# Patient Record
Sex: Female | Born: 1945
Health system: Southern US, Community
[De-identification: ages and names within clinical notes are randomized; demographics above are authoritative.]

## PROBLEM LIST (undated history)

## (undated) DIAGNOSIS — J439 Emphysema, unspecified: Secondary | ICD-10-CM

## (undated) DIAGNOSIS — K589 Irritable bowel syndrome without diarrhea: Secondary | ICD-10-CM

## (undated) DIAGNOSIS — T7840XA Allergy, unspecified, initial encounter: Secondary | ICD-10-CM

## (undated) DIAGNOSIS — C801 Malignant (primary) neoplasm, unspecified: Secondary | ICD-10-CM

## (undated) DIAGNOSIS — F419 Anxiety disorder, unspecified: Secondary | ICD-10-CM

## (undated) DIAGNOSIS — C3490 Malignant neoplasm of unspecified part of unspecified bronchus or lung: Secondary | ICD-10-CM

## (undated) DIAGNOSIS — I10 Essential (primary) hypertension: Secondary | ICD-10-CM

## (undated) DIAGNOSIS — B009 Herpesviral infection, unspecified: Secondary | ICD-10-CM

## (undated) DIAGNOSIS — J449 Chronic obstructive pulmonary disease, unspecified: Secondary | ICD-10-CM

## (undated) DIAGNOSIS — M75100 Unspecified rotator cuff tear or rupture of unspecified shoulder, not specified as traumatic: Secondary | ICD-10-CM

## (undated) DIAGNOSIS — F1910 Other psychoactive substance abuse, uncomplicated: Secondary | ICD-10-CM

## (undated) DIAGNOSIS — R0982 Postnasal drip: Secondary | ICD-10-CM

## (undated) DIAGNOSIS — I4510 Unspecified right bundle-branch block: Secondary | ICD-10-CM

## (undated) DIAGNOSIS — E785 Hyperlipidemia, unspecified: Secondary | ICD-10-CM

## (undated) DIAGNOSIS — Z8489 Family history of other specified conditions: Secondary | ICD-10-CM

## (undated) DIAGNOSIS — M797 Fibromyalgia: Secondary | ICD-10-CM

## (undated) DIAGNOSIS — F1010 Alcohol abuse, uncomplicated: Secondary | ICD-10-CM

## (undated) DIAGNOSIS — K219 Gastro-esophageal reflux disease without esophagitis: Secondary | ICD-10-CM

## (undated) HISTORY — DX: Unspecified rotator cuff tear or rupture of unspecified shoulder, not specified as traumatic: M75.100

## (undated) HISTORY — DX: Anxiety disorder, unspecified: F41.9

## (undated) HISTORY — DX: Irritable bowel syndrome, unspecified: K58.9

## (undated) HISTORY — PX: COLONOSCOPY: SHX174

## (undated) HISTORY — DX: Emphysema, unspecified: J43.9

## (undated) HISTORY — DX: Unspecified right bundle-branch block: I45.10

## (undated) HISTORY — DX: Postnasal drip: R09.82

## (undated) HISTORY — DX: Fibromyalgia: M79.7

## (undated) HISTORY — DX: Herpesviral infection, unspecified: B00.9

## (undated) HISTORY — DX: Allergy, unspecified, initial encounter: T78.40XA

## (undated) HISTORY — DX: Malignant neoplasm of unspecified part of unspecified bronchus or lung: C34.90

## (undated) HISTORY — DX: Alcohol abuse, uncomplicated: F10.10

## (undated) HISTORY — DX: Other psychoactive substance abuse, uncomplicated: F19.10

## (undated) HISTORY — DX: Gastro-esophageal reflux disease without esophagitis: K21.9

## (undated) HISTORY — DX: Hyperlipidemia, unspecified: E78.5

---

## 1998-08-17 ENCOUNTER — Other Ambulatory Visit: Admission: RE | Admit: 1998-08-17 | Discharge: 1998-08-17 | Payer: Self-pay | Admitting: Obstetrics & Gynecology

## 1999-08-30 ENCOUNTER — Encounter: Admission: RE | Admit: 1999-08-30 | Discharge: 1999-08-30 | Payer: Self-pay | Admitting: Orthopedic Surgery

## 1999-08-30 ENCOUNTER — Encounter: Payer: Self-pay | Admitting: Orthopedic Surgery

## 1999-10-03 ENCOUNTER — Other Ambulatory Visit: Admission: RE | Admit: 1999-10-03 | Discharge: 1999-10-03 | Payer: Self-pay | Admitting: Obstetrics & Gynecology

## 2000-02-12 HISTORY — PX: MOUTH SURGERY: SHX715

## 2000-10-08 ENCOUNTER — Other Ambulatory Visit: Admission: RE | Admit: 2000-10-08 | Discharge: 2000-10-08 | Payer: Self-pay | Admitting: Obstetrics & Gynecology

## 2000-11-27 ENCOUNTER — Encounter: Payer: Self-pay | Admitting: Obstetrics & Gynecology

## 2000-11-27 ENCOUNTER — Encounter: Admission: RE | Admit: 2000-11-27 | Discharge: 2000-11-27 | Payer: Self-pay | Admitting: Obstetrics & Gynecology

## 2001-11-20 ENCOUNTER — Other Ambulatory Visit: Admission: RE | Admit: 2001-11-20 | Discharge: 2001-11-20 | Payer: Self-pay | Admitting: Obstetrics & Gynecology

## 2002-12-31 ENCOUNTER — Other Ambulatory Visit: Admission: RE | Admit: 2002-12-31 | Discharge: 2002-12-31 | Payer: Self-pay | Admitting: Obstetrics & Gynecology

## 2004-01-09 ENCOUNTER — Ambulatory Visit: Payer: Self-pay | Admitting: Internal Medicine

## 2004-01-30 ENCOUNTER — Ambulatory Visit: Payer: Self-pay | Admitting: Internal Medicine

## 2004-02-10 ENCOUNTER — Encounter: Admission: RE | Admit: 2004-02-10 | Discharge: 2004-02-10 | Payer: Self-pay | Admitting: Obstetrics & Gynecology

## 2005-01-16 ENCOUNTER — Encounter: Admission: RE | Admit: 2005-01-16 | Discharge: 2005-01-16 | Payer: Self-pay | Admitting: Obstetrics & Gynecology

## 2005-06-12 ENCOUNTER — Ambulatory Visit: Payer: Self-pay | Admitting: Internal Medicine

## 2005-10-24 HISTORY — PX: ROTATOR CUFF REPAIR: SHX139

## 2006-06-19 ENCOUNTER — Encounter: Admission: RE | Admit: 2006-06-19 | Discharge: 2006-06-19 | Payer: Self-pay | Admitting: Obstetrics & Gynecology

## 2006-06-30 LAB — CONVERTED CEMR LAB: Pap Smear: NORMAL

## 2007-02-02 ENCOUNTER — Telehealth: Payer: Self-pay | Admitting: Internal Medicine

## 2007-06-22 ENCOUNTER — Encounter: Admission: RE | Admit: 2007-06-22 | Discharge: 2007-06-22 | Payer: Self-pay | Admitting: Obstetrics & Gynecology

## 2007-06-23 ENCOUNTER — Ambulatory Visit: Payer: Self-pay | Admitting: Internal Medicine

## 2007-06-23 DIAGNOSIS — T7840XA Allergy, unspecified, initial encounter: Secondary | ICD-10-CM | POA: Insufficient documentation

## 2007-06-24 DIAGNOSIS — E785 Hyperlipidemia, unspecified: Secondary | ICD-10-CM | POA: Insufficient documentation

## 2007-08-07 ENCOUNTER — Telehealth: Payer: Self-pay | Admitting: Internal Medicine

## 2007-08-13 ENCOUNTER — Ambulatory Visit: Payer: Self-pay | Admitting: Internal Medicine

## 2007-08-13 DIAGNOSIS — M719 Bursopathy, unspecified: Secondary | ICD-10-CM

## 2007-08-13 DIAGNOSIS — M67919 Unspecified disorder of synovium and tendon, unspecified shoulder: Secondary | ICD-10-CM | POA: Insufficient documentation

## 2007-08-13 DIAGNOSIS — K589 Irritable bowel syndrome without diarrhea: Secondary | ICD-10-CM | POA: Insufficient documentation

## 2007-08-17 ENCOUNTER — Telehealth: Payer: Self-pay | Admitting: Internal Medicine

## 2007-08-18 ENCOUNTER — Encounter: Payer: Self-pay | Admitting: Internal Medicine

## 2007-09-09 ENCOUNTER — Ambulatory Visit: Payer: Self-pay | Admitting: Internal Medicine

## 2007-09-09 DIAGNOSIS — J309 Allergic rhinitis, unspecified: Secondary | ICD-10-CM | POA: Insufficient documentation

## 2007-10-18 ENCOUNTER — Encounter: Payer: Self-pay | Admitting: Internal Medicine

## 2007-10-22 ENCOUNTER — Telehealth: Payer: Self-pay | Admitting: Internal Medicine

## 2007-11-29 ENCOUNTER — Encounter: Payer: Self-pay | Admitting: Internal Medicine

## 2007-12-01 ENCOUNTER — Telehealth (INDEPENDENT_AMBULATORY_CARE_PROVIDER_SITE_OTHER): Payer: Self-pay | Admitting: *Deleted

## 2007-12-01 ENCOUNTER — Telehealth: Payer: Self-pay | Admitting: Internal Medicine

## 2008-06-14 ENCOUNTER — Encounter: Payer: Self-pay | Admitting: Internal Medicine

## 2008-06-14 ENCOUNTER — Encounter: Payer: Self-pay | Admitting: Cardiovascular Disease

## 2008-06-16 ENCOUNTER — Encounter: Payer: Self-pay | Admitting: Cardiovascular Disease

## 2008-06-20 ENCOUNTER — Encounter: Payer: Self-pay | Admitting: Internal Medicine

## 2008-06-22 ENCOUNTER — Encounter: Admission: RE | Admit: 2008-06-22 | Discharge: 2008-06-22 | Payer: Self-pay | Admitting: Obstetrics & Gynecology

## 2008-06-24 DIAGNOSIS — I4510 Unspecified right bundle-branch block: Secondary | ICD-10-CM | POA: Insufficient documentation

## 2008-06-27 ENCOUNTER — Encounter: Payer: Self-pay | Admitting: Internal Medicine

## 2008-06-27 ENCOUNTER — Ambulatory Visit: Payer: Self-pay | Admitting: Cardiovascular Disease

## 2008-06-27 DIAGNOSIS — R0989 Other specified symptoms and signs involving the circulatory and respiratory systems: Secondary | ICD-10-CM | POA: Insufficient documentation

## 2008-07-04 ENCOUNTER — Encounter: Payer: Self-pay | Admitting: Cardiovascular Disease

## 2008-07-04 ENCOUNTER — Ambulatory Visit: Payer: Self-pay

## 2008-07-05 ENCOUNTER — Encounter: Payer: Self-pay | Admitting: Cardiovascular Disease

## 2008-08-04 ENCOUNTER — Encounter: Payer: Self-pay | Admitting: Internal Medicine

## 2008-08-09 ENCOUNTER — Ambulatory Visit: Payer: Self-pay | Admitting: Cardiovascular Disease

## 2008-08-12 ENCOUNTER — Encounter (INDEPENDENT_AMBULATORY_CARE_PROVIDER_SITE_OTHER): Payer: Self-pay | Admitting: *Deleted

## 2008-08-14 LAB — CONVERTED CEMR LAB
AST: 24 units/L (ref 0–37)
Alkaline Phosphatase: 65 units/L (ref 39–117)
Bilirubin, Direct: 0.1 mg/dL (ref 0.0–0.3)
LDL Cholesterol: 87 mg/dL (ref 0–99)
Total Bilirubin: 0.8 mg/dL (ref 0.3–1.2)
Total CHOL/HDL Ratio: 4

## 2009-01-23 ENCOUNTER — Ambulatory Visit: Payer: Self-pay | Admitting: Internal Medicine

## 2009-01-25 ENCOUNTER — Telehealth: Payer: Self-pay | Admitting: Internal Medicine

## 2009-05-31 ENCOUNTER — Encounter: Payer: Self-pay | Admitting: Internal Medicine

## 2009-06-08 ENCOUNTER — Encounter: Payer: Self-pay | Admitting: Internal Medicine

## 2009-06-26 ENCOUNTER — Encounter: Admission: RE | Admit: 2009-06-26 | Discharge: 2009-06-26 | Payer: Self-pay | Admitting: Obstetrics & Gynecology

## 2009-10-03 ENCOUNTER — Encounter: Admission: RE | Admit: 2009-10-03 | Discharge: 2009-10-03 | Payer: Self-pay | Admitting: Obstetrics & Gynecology

## 2009-11-28 ENCOUNTER — Ambulatory Visit: Payer: Self-pay | Admitting: Internal Medicine

## 2009-11-28 DIAGNOSIS — J069 Acute upper respiratory infection, unspecified: Secondary | ICD-10-CM | POA: Insufficient documentation

## 2010-01-02 ENCOUNTER — Ambulatory Visit: Payer: Self-pay | Admitting: Internal Medicine

## 2010-03-13 NOTE — Assessment & Plan Note (Signed)
Summary: LOW GRADE FEVER--SINUS PROBLEM-- STC   Vital Signs:  Patient profile:   65 year old female Height:      65 inches Weight:      151 pounds BMI:     25.22 O2 Sat:      94 % on Room air Temp:     98.1 degrees F oral Pulse rate:   108 / minute BP sitting:   125 / 58  (left arm) Cuff size:   regular  Vitals Entered By: Bill Salinas CMA (November 28, 2009 9:25 AM)  O2 Flow:  Room air CC: pt here with c/o head congestion, low grade fever, drainage and sore throat/ ab   Primary Care Provider:  Norins  CC:  pt here with c/o head congestion, low grade fever, and drainage and sore throat/ ab.  History of Present Illness: Onset last PM sore throat, fever, no cough, no SOB, no N/V, mild headache across frontal sinus. No generalized weakness.   Current Medications (verified): 1)  Symax Duotab 0.375 Mg  Tbcr (Hyoscyamine Sulfate) .... Take 1 Tablet By Mouth Every 12 Hours 2)  Prilosec 20 Mg  Cpdr (Omeprazole) .Marland Kitchen.. 1 Gtab By Mouth Two Times A Day 3)  Hydroxyzine Hcl 25 Mg  Tabs (Hydroxyzine Hcl) .... Take 1 Tab By Mouth At Bedtime 4)  Caltrate 600+d 600-400 Mg-Unit Tabs (Calcium Carbonate-Vitamin D) .Marland Kitchen.. 1 Tab By Mouth Once Daily 5)  Multivitamins   Tabs (Multiple Vitamin) .... Take 1 Tablet By Mouth Once A Day 6)  Tylenol Pm Extra Strength 500-25 Mg  Tabs (Diphenhydramine-Apap (Sleep)) .... Take 1 Tab By Mouth At Bedtime As Needed 7)  L-Lysine Hcl 500 Mg  Caps (Lysine Hcl) .... Take 1 Tablet By Mouth Once A Day 8)  Garlic 400 Mg  Tbec (Garlic) .... Take 1 Tablet By Mouth Once A Day 9)  Flonase 50 Mcg/act Susp (Fluticasone Propionate) .Marland Kitchen.. 1 Spray Each Nostril Once Daily As Needed 10)  Gnp Loratadine 10 Mg Tabs (Loratadine) .... As Needed 11)  Calcium 500 Mg Tabs (Calcium Carbonate) .Marland Kitchen.. 1 Tab By Mouth Once Daily 12)  Crestor 5 Mg Tabs (Rosuvastatin Calcium) .Marland Kitchen.. 1 Tab Once Daily. 13)  Methocarbamol 500 Mg Tabs (Methocarbamol) .Marland Kitchen.. 1 Every 6 Hours As Needed For Muscle  Spasm  Allergies (verified): 1)  ! Pcn 2)  ! Sulfa 3)  ! Codeine 4)  ! Jonne Ply  Past History:  Past Medical History: Last updated: 06/24/2008 RIGHT BUNDLE BRANCH BLOCK (ICD-426.4) HYPERLIPIDEMIA (ICD-272.4) POSTNASAL DRIP SYNDROME, ALLERGIC (ICD-477.9) HEALTHY ADULT FEMALE (ICD-V70.0) IBS (ICD-564.1) Hx of ROTATOR CUFF SYNDROME, RIGHT (ICD-726.10) ALLERGIC REACTION, ACUTE (ICD-995.3) h/o alcohol abuse - abstemious 20 years. Attends AA - strong recovery   Past Surgical History: Last updated: 06/24/2008 oral surgery '02 fx left foot  dislocated right ankle '86 Rotator cuff repair 10/24/05-had bone spur and laceration of cuff. 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 ..quit 55yrs ago Alcohol Use - no..AA 20+yrs.Marland Kitchenstrong recovery Drug Use - no  Review of Systems  The patient denies anorexia, fever, weight loss, vision loss, hoarseness, chest pain, syncope, dyspnea on exertion, headaches, abdominal pain, incontinence, muscle  weakness, depression, abnormal bleeding, and enlarged lymph nodes.    Physical Exam  General:  alert, well-developed, and well-nourished.   Head:  tender frontal and maxillary sinus Eyes:  C&S clear Ears:  TMs normal Mouth:  Throat clear Neck:  supple and full ROM.   Lungs:  normal respiratory effort, normal breath sounds, no crackles, and no wheezes.   Heart:  sinus tachycardia   Impression & Recommendations:  Problem # 1:  URI (ICD-465.9) Early URI without convincing evidence of a bacterial infection.   Plan - supportive care           z-pak  if needed.   Her updated medication list for this problem includes:    Gnp Loratadine 10 Mg Tabs (Loratadine) .Marland Kitchen... As needed  Complete Medication List: 1)  Symax Duotab 0.375 Mg Tbcr (Hyoscyamine sulfate) .... Take 1 tablet by mouth every 12 hours 2)  Prilosec 20 Mg Cpdr (Omeprazole) .Marland Kitchen.. 1 gtab by mouth two times a day 3)  Hydroxyzine Hcl 25 Mg Tabs (Hydroxyzine hcl) .... Take 1 tab by mouth at bedtime 4)  Caltrate 600+d 600-400 Mg-unit Tabs (Calcium carbonate-vitamin d) .Marland Kitchen.. 1 tab by mouth once daily 5)  Multivitamins Tabs (Multiple vitamin) .... Take 1 tablet by mouth once a day 6)  Tylenol Pm Extra Strength 500-25 Mg Tabs (Diphenhydramine-apap (sleep)) .... Take 1 tab by mouth at bedtime as needed 7)  L-lysine Hcl 500 Mg Caps (Lysine hcl) .... Take 1 tablet by mouth once a day 8)  Garlic 400 Mg Tbec (Garlic) .... Take 1 tablet by mouth once a day 9)  Flonase 50 Mcg/act Susp (Fluticasone propionate) .Marland Kitchen.. 1 spray each nostril once daily as needed 10)  Gnp Loratadine 10 Mg Tabs (Loratadine) .... As needed 11)  Calcium 500 Mg Tabs (Calcium carbonate) .Marland Kitchen.. 1 tab by mouth once daily 12)  Crestor 5 Mg Tabs (Rosuvastatin calcium) .Marland Kitchen.. 1 tab once daily. 13)  Methocarbamol 500 Mg Tabs (Methocarbamol) .Marland Kitchen.. 1 every 6 hours as needed for muscle spasm  Patient Instructions: 1)  Upper respiratory infection - may be a bacterial infection developing. Plan - tylenol for fever and aches, zyrtec - over the counter, vitamine 1500mg  daily, ecchinacea in any form, pseudoephedrine 30mg  bid or tid (from behind the counter). For fever, purulent drainage, increased pain fill Rx for azithromycin.    Orders Added: 1)  Est. Patient Level III [04540]

## 2010-03-13 NOTE — Assessment & Plan Note (Signed)
Summary: INFECTION IN FINGER/NWS   Vital Signs:  Patient profile:   65 year old female Height:      65 inches Weight:      152 pounds BMI:     25.39 O2 Sat:      96 % on Room air Temp:     97.7 degrees F oral Pulse rate:   82 / minute BP sitting:   142 / 80  (left arm) Cuff size:   regular  Vitals Entered By: Bill Salinas CMA (January 02, 2010 4:03 PM)  O2 Flow:  Room air CC: pt here for evaluation of infected right thumb/ ab   Primary Care Provider:  Jacklyne Baik  CC:  pt here for evaluation of infected right thumb/ ab.  History of Present Illness: Patient presents with an area of swelling, redness and pain at the medial base of the thumbnail right. The area as become progressively swollen and painful. She did lance this herself but no liberation of pus. She has not had fever. She has been soaking this with some improvement.   Current Medications (verified): 1)  Symax Duotab 0.375 Mg  Tbcr (Hyoscyamine Sulfate) .... Take 1 Tablet By Mouth Every 12 Hours 2)  Prilosec 20 Mg  Cpdr (Omeprazole) .Marland Kitchen.. 1 Gtab By Mouth Two Times A Day 3)  Hydroxyzine Hcl 25 Mg  Tabs (Hydroxyzine Hcl) .... Take 1 Tab By Mouth At Bedtime 4)  Caltrate 600+d 600-400 Mg-Unit Tabs (Calcium Carbonate-Vitamin D) .Marland Kitchen.. 1 Tab By Mouth Once Daily 5)  Multivitamins   Tabs (Multiple Vitamin) .... Take 1 Tablet By Mouth Once A Day 6)  Tylenol Pm Extra Strength 500-25 Mg  Tabs (Diphenhydramine-Apap (Sleep)) .... Take 1 Tab By Mouth At Bedtime As Needed 7)  L-Lysine Hcl 500 Mg  Caps (Lysine Hcl) .... Take 1 Tablet By Mouth Once A Day 8)  Garlic 400 Mg  Tbec (Garlic) .... Take 1 Tablet By Mouth Once A Day 9)  Flonase 50 Mcg/act Susp (Fluticasone Propionate) .Marland Kitchen.. 1 Spray Each Nostril Once Daily As Needed 10)  Gnp Loratadine 10 Mg Tabs (Loratadine) .... As Needed 11)  Calcium 500 Mg Tabs (Calcium Carbonate) .Marland Kitchen.. 1 Tab By Mouth Once Daily 12)  Crestor 5 Mg Tabs (Rosuvastatin Calcium) .Marland Kitchen.. 1 Tab Once Daily. 13)  Methocarbamol  500 Mg Tabs (Methocarbamol) .Marland Kitchen.. 1 Every 6 Hours As Needed For Muscle Spasm  Allergies (verified): 1)  ! Pcn 2)  ! Sulfa 3)  ! Codeine 4)  ! Asa PMH-FH-SH reviewed-no changes except otherwise noted  Review of Systems  The patient denies anorexia, fever, weight loss, weight gain, chest pain, dyspnea on exertion, headaches, incontinence, difficulty walking, and abnormal bleeding.    Physical Exam  General:  Well-developed,well-nourished,in no acute distress; alert,appropriate and cooperative throughout examination Skin:  area at medial aspect of right thumb with .2 cm white area surround by erythematous, tender to the touch.    Impression & Recommendations:  Problem # 1:  CELLULITIS, FINGER (ICD-681.00) infectioin of index finger, not a paronychia.  Plan- betadiene soaks          keflex 500mg  qid x 7  Her updated medication list for this problem includes:    Cephalexin 500 Mg Caps (Cephalexin) .Marland Kitchen... 1 by mouth qid x 7 days  Complete Medication List: 1)  Symax Duotab 0.375 Mg Tbcr (Hyoscyamine sulfate) .... Take 1 tablet by mouth every 12 hours 2)  Prilosec 20 Mg Cpdr (Omeprazole) .Marland Kitchen.. 1 gtab by mouth two times a day 3)  Hydroxyzine Hcl 25 Mg Tabs (Hydroxyzine hcl) .... Take 1 tab by mouth at bedtime 4)  Caltrate 600+d 600-400 Mg-unit Tabs (Calcium carbonate-vitamin d) .Marland Kitchen.. 1 tab by mouth once daily 5)  Multivitamins Tabs (Multiple vitamin) .... Take 1 tablet by mouth once a day 6)  Tylenol Pm Extra Strength 500-25 Mg Tabs (Diphenhydramine-apap (sleep)) .... Take 1 tab by mouth at bedtime as needed 7)  L-lysine Hcl 500 Mg Caps (Lysine hcl) .... Take 1 tablet by mouth once a day 8)  Garlic 400 Mg Tbec (Garlic) .... Take 1 tablet by mouth once a day 9)  Flonase 50 Mcg/act Susp (Fluticasone propionate) .Marland Kitchen.. 1 spray each nostril once daily as needed 10)  Gnp Loratadine 10 Mg Tabs (Loratadine) .... As needed 11)  Calcium 500 Mg Tabs (Calcium carbonate) .Marland Kitchen.. 1 tab by mouth once  daily 12)  Crestor 5 Mg Tabs (Rosuvastatin calcium) .Marland Kitchen.. 1 tab once daily. 13)  Methocarbamol 500 Mg Tabs (Methocarbamol) .Marland Kitchen.. 1 every 6 hours as needed for muscle spasm 14)  Cephalexin 500 Mg Caps (Cephalexin) .Marland Kitchen.. 1 by mouth qid x 7 days  Patient Instructions: 1)  infected thumb- DO NOT MANIPULATE IT. kEFLEX (generic) 4 times a day for a week to stop infection. Soak in warm water with betadiene x 5-10 minutes twice a day. Call for fever, ascending redness, increased swelling.  Prescriptions: CEPHALEXIN 500 MG CAPS (CEPHALEXIN) 1 by mouth qid x 7 days  #28 x 0   Entered and Authorized by:   Jacques Navy MD   Signed by:   Lamar Sprinkles, CMA on 01/03/2010   Method used:   Electronically to        [facility] [address] [phone]* (retail)       [address]       [place], [place]  [address]       Ph: [phone] or [phone]       Fax: [phone]   RxID:   [id number]    Orders Added: 1)  Est. Patient Level III [56433]

## 2010-05-09 ENCOUNTER — Other Ambulatory Visit: Payer: Self-pay | Admitting: *Deleted

## 2010-05-09 ENCOUNTER — Telehealth: Payer: Self-pay | Admitting: *Deleted

## 2010-05-09 MED ORDER — HYOSCYAMINE SULFATE ER 0.375 MG PO TBCR: 1.0000 | EXTENDED_RELEASE_TABLET | Freq: Two times a day (BID) | ORAL | Status: DC

## 2010-05-09 NOTE — Telephone Encounter (Signed)
Duplicate

## 2010-05-11 ENCOUNTER — Other Ambulatory Visit: Payer: Self-pay | Admitting: Internal Medicine

## 2010-05-28 ENCOUNTER — Telehealth: Payer: Self-pay | Admitting: *Deleted

## 2010-05-28 DIAGNOSIS — E785 Hyperlipidemia, unspecified: Secondary | ICD-10-CM

## 2010-05-28 NOTE — Telephone Encounter (Signed)
Lab appt

## 2010-06-01 ENCOUNTER — Other Ambulatory Visit (INDEPENDENT_AMBULATORY_CARE_PROVIDER_SITE_OTHER): Payer: BC Managed Care – PPO | Admitting: *Deleted

## 2010-06-01 ENCOUNTER — Other Ambulatory Visit: Payer: Self-pay | Admitting: Cardiovascular Disease

## 2010-06-01 DIAGNOSIS — I4510 Unspecified right bundle-branch block: Secondary | ICD-10-CM

## 2010-06-01 DIAGNOSIS — E785 Hyperlipidemia, unspecified: Secondary | ICD-10-CM

## 2010-06-01 LAB — BASIC METABOLIC PANEL
CO2: 30 mEq/L (ref 19–32)
Chloride: 104 mEq/L (ref 96–112)
GFR: 69.9 mL/min (ref >60.00)
Glucose, Bld: 91 mg/dL (ref 70–99)
Potassium: 4.3 mEq/L (ref 3.5–5.1)
Sodium: 142 mEq/L (ref 135–145)

## 2010-06-01 LAB — HEPATIC FUNCTION PANEL
ALT: 33 U/L (ref 0–35)
AST: 29 U/L (ref 0–37)
Albumin: 4 g/dL (ref 3.5–5.2)
Alkaline Phosphatase: 65 U/L (ref 39–117)
Total Protein: 7 g/dL (ref 6.0–8.3)

## 2010-06-01 LAB — LIPID PANEL: VLDL: 24.4 mg/dL (ref 0.0–40.0)

## 2010-06-02 ENCOUNTER — Encounter: Payer: Self-pay | Admitting: Cardiovascular Disease

## 2010-06-02 ENCOUNTER — Encounter: Payer: Self-pay | Admitting: *Deleted

## 2010-06-05 ENCOUNTER — Encounter: Payer: Self-pay | Admitting: Cardiovascular Disease

## 2010-06-05 ENCOUNTER — Ambulatory Visit (INDEPENDENT_AMBULATORY_CARE_PROVIDER_SITE_OTHER): Payer: BC Managed Care – PPO | Admitting: Cardiovascular Disease

## 2010-06-05 DIAGNOSIS — E785 Hyperlipidemia, unspecified: Secondary | ICD-10-CM

## 2010-06-05 DIAGNOSIS — I4510 Unspecified right bundle-branch block: Secondary | ICD-10-CM

## 2010-06-05 NOTE — Patient Instructions (Signed)
Your physician recommends that you schedule a follow-up appointment in: ONE YEAR 

## 2010-06-05 NOTE — Assessment & Plan Note (Signed)
Cholesterol is at goal.  Continue current dose of statin and diet Rx.  No myalgias or side effects.  F/U  LFT's in 6 months. Lab Results  Component Value Date   LDLCALC 92 06/01/2010

## 2010-06-05 NOTE — Assessment & Plan Note (Signed)
ECG stable Normal variant for patient.  Repeat yearly

## 2010-06-05 NOTE — Progress Notes (Signed)
Teresa Booker is seen today for F/U of cholesterol and abnormal ECG.  She goes by "Teresa Booker"  Her LDL is great at 92 with normal LFTs and no side effects from low dose crestor.  She took early retirement from [employer] and her stress level is much lower.  ECG has shown RSR' with no changes and is probably normal for her.  No SSCP, palpitations, dyspnea.  Dropped a yeard ornament on her left toe but this is improving.  Previous palpable aorta but [place] with no AAA  ROS: Denies fever, malais, weight loss, blurry vision, decreased visual acuity, cough, sputum, SOB, hemoptysis, pleuritic pain, palpitaitons, heartburn, abdominal pain, melena, lower extremity edema, claudication, or rash.   General: Affect appropriate Healthy:  appears stated age HEENT: normal Neck supple with no adenopathy JVP normal no bruits no thyromegaly Lungs clear with no wheezing and good diaphragmatic motion Heart:  S1/S2 no murmur,rub, gallop or click PMI normal Abdomen: benighn, BS positve, no tenderness, no AAA no bruit.  No HSM or HJR Distal pulses intact with no bruits No edema Neuro non-focal Skin warm and dry No muscular weakness   Current Outpatient Prescriptions  Medication Sig Dispense Refill  . Calcium Carbonate-Vitamin D (CALTRATE 600+D) 600-400 MG-UNIT per tablet Take 1 tablet by mouth daily.        . calcium gluconate 500 MG tablet Take 500 mg by mouth daily.        . Diphenhydramine-APAP, sleep, (ACETAMINOPHEN PM PO) Take by mouth.        . Echinacea 125 MG CAPS Take by mouth.        . fluticasone (FLONASE) 50 MCG/ACT nasal spray 2 sprays by Nasal route daily.        . Garlic 400 MG TABS 1 tab po qd       . hydrOXYzine (ATARAX) 25 MG tablet TAKE 1 TABLET AT BEDTIME  180 tablet  2  . Hyoscyamine Sulfate 0.375 MG TBCR Take 1 tablet (0.375 mg total) by mouth 2 (two) times daily.  180 each  3  . loratadine (CLARITIN) 10 MG tablet Take 10 mg by mouth daily.        Marland Kitchen Lysine HCl 500 MG TABS 1 tab po qd       .  methocarbamol (ROBAXIN) 500 MG tablet Take 500 mg by mouth 4 (four) times daily.        . Multiple Vitamin (MULTIVITAMIN) capsule Take 1 capsule by mouth daily.        Marland Kitchen omeprazole (PRILOSEC) 20 MG capsule Take 20 mg by mouth daily.        . rosuvastatin (CRESTOR) 5 MG tablet Take 5 mg by mouth daily.          Allergies  Aspirin; Codeine; Penicillins; and Sulfonamide derivatives  Electrocardiogram:  NSR RSR;s essentially normal ECG with no change from 2011  Assessment and Plan

## 2010-06-29 ENCOUNTER — Other Ambulatory Visit: Payer: Self-pay | Admitting: Obstetrics & Gynecology

## 2010-06-29 DIAGNOSIS — Z1231 Encounter for screening mammogram for malignant neoplasm of breast: Secondary | ICD-10-CM

## 2010-07-03 ENCOUNTER — Ambulatory Visit (INDEPENDENT_AMBULATORY_CARE_PROVIDER_SITE_OTHER): Payer: BC Managed Care – PPO | Admitting: Internal Medicine

## 2010-07-03 VITALS — BP 128/68 | HR 82 | Temp 98.6°F | Wt 151.0 lb

## 2010-07-03 DIAGNOSIS — M546 Pain in thoracic spine: Secondary | ICD-10-CM

## 2010-07-04 ENCOUNTER — Encounter: Payer: Self-pay | Admitting: Internal Medicine

## 2010-07-04 NOTE — Progress Notes (Signed)
Subjective:    Patient ID: Teresa Booker, female    DOB: 1945/02/15, 65 y.o.   MRN: [id number]  HPI Teresa Booker presents for pain in the right back with radiation to right anterior chest. There is movement associated pain. She is most comfortable sitting and has trouble resting supine. She has had no loss of sensation, loss of strength, skin rash. He reports that she was cleaning out a large chest freezer and was bent way over and reaching during this activity. The pain was subsequent to this activitiy. She has been to chiropractor and manipulations did help. She has not taken much for this except low dose APAP.  Past Medical History  Diagnosis Date  . RBBB (right bundle branch block)   . Hyperlipemia   . Postnasal drip   . IBS (irritable bowel syndrome)   . Rotator cuff syndrome   . Allergic reaction   . Alcohol abuse     - abstemious 20 years. Attends AA - strong recovery    Past Surgical History  Procedure Date  . Mouth surgery   . Foot surgery   . Ankle surgery   . Rotator cuff repair    Family History  Problem Relation Age of Onset  . Stroke    . Aneurysm    . Lung cancer     History   Social History  . Marital Status: Single    Spouse Name: N/A    Number of Children: N/A  . Years of Education: 16   Occupational History  . manager     retired   Social History Main Topics  . Smoking status: Former Games developer  . Smokeless tobacco: Never Used   Comment: quit appox 10 yrs ago  . Alcohol Use: No     former usuer AA 20+yrs ago strong recovery  . Drug Use: No  . Sexually Active: Yes -- Female partner(s)   Other Topics Concern  . Not on file   Social History Narrative   [employer]; work: [employer] -long time, took retirement '10. Long-term relationship - '95. Currently attending classes in medical coding (May '12)       Review of Systems Review of Systems  Constitutional:  Negative for fever, chills, activity change and unexpected weight change.  HENT:  Negative  for hearing loss, ear pain, congestion, neck stiffness and postnasal drip.   Eyes: Negative for pain, discharge and visual disturbance.  Respiratory: Negative for chest tightness and wheezing.   Cardiovascular: Negative for chest pain and palpitations.       [No decreased exercise tolerance Gastrointestinal: [No change in bowel habit. No bloating or gas. No reflux or indigestion Genitourinary: Negative for urgency, frequency, flank pain and difficulty urinating.  Musculoskeletal: Positive for  back pain, negative for arthralgias or gait problem.  Neurological: Negative for dizziness, tremors, weakness and headaches.  Hematological: Negative for adenopathy.  Psychiatric/Behavioral: Negative for behavioral problems and dysphoric mood.       Objective:   Physical Exam Vitals noted Gen'l- WNWD WW in no distress Pul- normal respirations Cor - RRR Abdomen - BS positive, no HSM, no mass, no epigastric tenderness Back exam - nl stand, nl ROM UE, nl strength. Tender to palpation in the intercostal space right back at T 7-9 level lateral to  Spine. Some tenderness extends to the axillary line.        Assessment & Plan:  1. Back pain due to muscle strain  Plan - heat pads, lineament of choice  otc NSIADs           Gentle stretching.           Ok to use old muscle relaxants.

## 2010-07-06 ENCOUNTER — Ambulatory Visit
Admission: RE | Admit: 2010-07-06 | Discharge: 2010-07-06 | Disposition: A | Discharge status: Home / Self Care | Payer: BC Managed Care – PPO | Source: Ambulatory Visit | Attending: Obstetrics & Gynecology | Admitting: Obstetrics & Gynecology

## 2010-07-06 DIAGNOSIS — Z1231 Encounter for screening mammogram for malignant neoplasm of breast: Secondary | ICD-10-CM

## 2010-07-10 ENCOUNTER — Telehealth: Payer: Self-pay | Admitting: *Deleted

## 2010-07-10 ENCOUNTER — Ambulatory Visit (INDEPENDENT_AMBULATORY_CARE_PROVIDER_SITE_OTHER): Payer: BC Managed Care – PPO | Admitting: *Deleted

## 2010-07-10 DIAGNOSIS — Z23 Encounter for immunization: Secondary | ICD-10-CM

## 2010-07-10 DIAGNOSIS — Z2911 Encounter for prophylactic immunotherapy for respiratory syncytial virus (RSV): Secondary | ICD-10-CM

## 2010-07-10 NOTE — Telephone Encounter (Signed)
Spoke w/pt - No meal related pain or change in stools. She has increase in pain w/movement and when she sits to rest it will improve. She has increase in pain at night when lying down to sleep - heat has help some w/discomfort. Please advise.

## 2010-07-10 NOTE — Telephone Encounter (Signed)
Didn't really look like gallbladder disease on initial evaluation. If she is having meal related pain, change in stools to very light color, increased fat in the stool then we can GB u/s.

## 2010-07-10 NOTE — Telephone Encounter (Signed)
Pt states her Right side abd pain is still present and is radiating around and through to her back. She is asking whether any further tests should be done to possibly check gallbladder. Please advise

## 2010-07-11 NOTE — Telephone Encounter (Signed)
Still favor the muscle strain diagnosis based on response to rest and heat. No need for GB evaluation. This can take time in the more mature patient.

## 2010-07-13 ENCOUNTER — Other Ambulatory Visit: Payer: Self-pay | Admitting: Internal Medicine

## 2010-07-13 ENCOUNTER — Telehealth: Payer: Self-pay

## 2010-07-13 ENCOUNTER — Other Ambulatory Visit (INDEPENDENT_AMBULATORY_CARE_PROVIDER_SITE_OTHER): Payer: BC Managed Care – PPO

## 2010-07-13 DIAGNOSIS — M549 Dorsalgia, unspecified: Secondary | ICD-10-CM

## 2010-07-13 LAB — URINALYSIS, ROUTINE W REFLEX MICROSCOPIC
Ketones, ur: NEGATIVE
Leukocytes, UA: NEGATIVE
Specific Gravity, Urine: 1.02 (ref 1.000–1.030)
Total Protein, Urine: NEGATIVE
pH: 5.5 (ref 5.0–8.0)

## 2010-07-13 LAB — CBC WITH DIFFERENTIAL/PLATELET
Basophils Relative: 0.4 % (ref 0.0–3.0)
Eosinophils Relative: 1.5 % (ref 0.0–5.0)
Lymphocytes Relative: 32.7 % (ref 12.0–46.0)
MCV: 92.9 fl (ref 78.0–100.0)
Neutrophils Relative %: 57.3 % (ref 43.0–77.0)
RBC: 4.04 Mil/uL (ref 3.87–5.11)
WBC: 6.3 10*3/uL (ref 4.5–10.5)

## 2010-07-13 NOTE — Telephone Encounter (Signed)
Per MD, he received email from patient and request to come by for a U/A and CBC dx back pain. Orders placed in Epic

## 2010-07-22 ENCOUNTER — Other Ambulatory Visit: Payer: Self-pay | Admitting: Cardiovascular Disease

## 2010-08-14 ENCOUNTER — Other Ambulatory Visit: Payer: Self-pay | Admitting: Internal Medicine

## 2010-08-14 DIAGNOSIS — R1011 Right upper quadrant pain: Secondary | ICD-10-CM | POA: Insufficient documentation

## 2010-08-17 ENCOUNTER — Ambulatory Visit
Admission: RE | Admit: 2010-08-17 | Discharge: 2010-08-17 | Disposition: A | Discharge status: Home / Self Care | Payer: BC Managed Care – PPO | Source: Ambulatory Visit | Attending: Internal Medicine | Admitting: Internal Medicine

## 2010-08-17 ENCOUNTER — Telehealth: Payer: Self-pay | Admitting: *Deleted

## 2010-08-17 DIAGNOSIS — R05 Cough: Secondary | ICD-10-CM

## 2010-08-17 DIAGNOSIS — R1011 Right upper quadrant pain: Secondary | ICD-10-CM

## 2010-08-17 DIAGNOSIS — R059 Cough, unspecified: Secondary | ICD-10-CM

## 2010-08-17 NOTE — Telephone Encounter (Signed)
Patient left vm stating that MD was going to set up apt for xray?

## 2010-08-19 ENCOUNTER — Telehealth: Payer: Self-pay | Admitting: Internal Medicine

## 2010-08-19 NOTE — Telephone Encounter (Signed)
Needs PA and LAT cxr - cough

## 2010-08-19 NOTE — Telephone Encounter (Signed)
U/S abdomen: no gallbladder disease; slight fatty infiltration of the livery, otherwise normal. For questions can see in the office. Thanks

## 2010-08-20 NOTE — Telephone Encounter (Signed)
Order entered, Patient informed

## 2010-08-20 NOTE — Telephone Encounter (Signed)
Informed pt , she would like to know if you want her to have an xray done

## 2010-08-20 NOTE — Telephone Encounter (Signed)
Yes. 2 view chest xray. Dx - chest pain

## 2010-08-21 ENCOUNTER — Ambulatory Visit (INDEPENDENT_AMBULATORY_CARE_PROVIDER_SITE_OTHER)
Admission: RE | Admit: 2010-08-21 | Discharge: 2010-08-21 | Disposition: A | Discharge status: Home / Self Care | Payer: BC Managed Care – PPO | Source: Ambulatory Visit | Attending: Internal Medicine | Admitting: Internal Medicine

## 2010-08-21 DIAGNOSIS — R059 Cough, unspecified: Secondary | ICD-10-CM

## 2010-08-21 DIAGNOSIS — R05 Cough: Secondary | ICD-10-CM

## 2010-08-22 NOTE — Telephone Encounter (Signed)
Pt already aware (see other phone note). CXR completed.

## 2010-08-23 ENCOUNTER — Ambulatory Visit: Payer: BC Managed Care – PPO | Admitting: Internal Medicine

## 2010-08-27 ENCOUNTER — Telehealth: Payer: Self-pay | Admitting: Internal Medicine

## 2010-08-27 NOTE — Telephone Encounter (Signed)
Please call - chest x-ray is normal. Thank you

## 2010-08-27 NOTE — Telephone Encounter (Signed)
Informed pt .

## 2010-08-28 ENCOUNTER — Encounter: Payer: Self-pay | Admitting: Internal Medicine

## 2010-08-31 ENCOUNTER — Telehealth: Payer: Self-pay | Admitting: *Deleted

## 2010-08-31 ENCOUNTER — Other Ambulatory Visit: Payer: Self-pay | Admitting: Physician Assistant

## 2010-08-31 DIAGNOSIS — T63441A Toxic effect of venom of bees, accidental (unintentional), initial encounter: Secondary | ICD-10-CM

## 2010-08-31 MED ORDER — PREDNISONE 10 MG PO KIT: 1.0000 | PACK | Freq: Every day | ORAL | Status: DC

## 2010-08-31 NOTE — Telephone Encounter (Signed)
Pt c/o wasp sting w/swelling & requesting Rx for Prednisone.

## 2010-08-31 NOTE — Telephone Encounter (Signed)
Called pt. She had seen another provider who called in a prednisone dose - pak

## 2010-09-07 ENCOUNTER — Ambulatory Visit (INDEPENDENT_AMBULATORY_CARE_PROVIDER_SITE_OTHER): Payer: BC Managed Care – PPO | Admitting: Internal Medicine

## 2010-09-07 ENCOUNTER — Encounter: Payer: Self-pay | Admitting: Internal Medicine

## 2010-09-07 DIAGNOSIS — R1011 Right upper quadrant pain: Secondary | ICD-10-CM

## 2010-09-08 NOTE — Assessment & Plan Note (Signed)
Resolving pain. No findings on work up to date.  Plan - watchful waiting - no further testing.

## 2010-09-08 NOTE — Progress Notes (Signed)
  Subjective:    Patient ID: Teresa Booker, female    DOB: Jul 27, 1945, 65 y.o.   MRN: [id number]  HPI Ms. Bch presents due to persistent discomfort in the RUQ abdomen that started after cleaning out a chest freezer. She has had CXR - negative; Abdominal U/S -negative. She has had no bulge on physical exam to suggest hernia. Working diagnosis is muscle strain.  She has had hymenoptra envenomation that required the use of oral prednisone. She has made a full recovery  PMH, FamHx and SocHx reviewed for any changes and relevance.    Review of Systems Review of Systems  Constitutional:  Negative for fever, chills, activity change and unexpected weight change.  HEENT:  Negative for hearing loss, ear pain, congestion, neck stiffness and postnasal drip. Negative for sore throat or swallowing problems. Negative for dental complaints.   Eyes: Negative for vision loss or change in visual acuity.  Respiratory: Negative for chest tightness and wheezing.   Cardiovascular: Negative for chest pain and palpitation. No decreased exercise tolerance Gastrointestinal: No change in bowel habit. No bloating or gas. No reflux or indigestion Genitourinary: Negative for urgency, frequency, flank pain and difficulty urinating.  Musculoskeletal: Negative for myalgias, back pain, arthralgias and gait problem.  Neurological: Negative for dizziness, tremors, weakness and headaches.  Hematological: Negative for adenopathy.  Psychiatric/Behavioral: Negative for behavioral problems and dysphoric mood.       Objective:   Physical Exam Vitals noted - BP up just a little Abdomen - no visible bulge or lesion       Assessment & Plan:   No problem-specific assessment & plan notes found for this encounter.

## 2010-10-10 ENCOUNTER — Ambulatory Visit (INDEPENDENT_AMBULATORY_CARE_PROVIDER_SITE_OTHER): Payer: BC Managed Care – PPO | Admitting: Internal Medicine

## 2010-10-10 ENCOUNTER — Encounter: Payer: Self-pay | Admitting: Internal Medicine

## 2010-10-10 DIAGNOSIS — J069 Acute upper respiratory infection, unspecified: Secondary | ICD-10-CM

## 2010-10-10 MED ORDER — SULFAMETHOXAZOLE-TRIMETHOPRIM 800-160 MG PO TABS: 1.0000 | ORAL_TABLET | Freq: Two times a day (BID) | ORAL | Status: AC

## 2010-10-10 NOTE — Assessment & Plan Note (Signed)
Symptoms of URI  Plan - reviewed current recommendations UpToDate - will Rx with septra DS bid for 7 days.           Continue irrigation, APAP, heat

## 2010-10-10 NOTE — Progress Notes (Signed)
  Subjective:    Patient ID: Teresa Booker, female    DOB: 09/01/1945, 65 y.o.   MRN: [id number]  HPI Teresa Booker presents with a several day h/o sinus pain, especially left maxillary, post-nasal drainage, purulent mucus and low grade fevers. She has been using a nettie pot, taking APAP but continues to have symptoms  I have reviewed the patient's medical history in detail and updated the computerized patient record.    Review of Systems System review is negative for any constitutional, cardiac, pulmonary, GI or neuro symptoms or complaints     Objective:   Physical Exam Vitals reviewed - no fever Gen'l - WNWD white woman no distress HEENT - tender to percussion, worse over left maxillary sinus, throat clear Nodes - negative Lungs - CTAP       Assessment & Plan:

## 2010-11-29 ENCOUNTER — Ambulatory Visit (INDEPENDENT_AMBULATORY_CARE_PROVIDER_SITE_OTHER): Payer: BC Managed Care – PPO | Admitting: *Deleted

## 2010-11-29 DIAGNOSIS — Z23 Encounter for immunization: Secondary | ICD-10-CM

## 2011-01-22 ENCOUNTER — Telehealth: Payer: Self-pay | Admitting: Internal Medicine

## 2011-01-22 NOTE — Telephone Encounter (Signed)
The pt left a message stating she needs to change her pharmacy to the [facility] outpatient pharmacy.  She said in the message she needs her prescriptions changed over, however, I called her back and asked if there were specific ones she needs.  I'm waiting for her call back.    Thanks!

## 2011-01-23 ENCOUNTER — Other Ambulatory Visit: Payer: Self-pay | Admitting: Internal Medicine

## 2011-01-23 MED ORDER — HYOSCYAMINE SULFATE ER 0.375 MG PO TBCR: 1.0000 | EXTENDED_RELEASE_TABLET | Freq: Two times a day (BID) | ORAL | Status: DC

## 2011-01-23 MED ORDER — LORATADINE 10 MG PO TABS: 10.0000 mg | ORAL_TABLET | Freq: Every day | ORAL | Status: AC

## 2011-01-23 MED ORDER — FLUTICASONE PROPIONATE 50 MCG/ACT NA SUSP: 2.0000 | Freq: Every day | NASAL | Status: DC

## 2011-01-23 MED ORDER — ROSUVASTATIN CALCIUM 5 MG PO TABS: 5.0000 mg | ORAL_TABLET | Freq: Every day | ORAL | Status: DC

## 2011-01-23 MED ORDER — HYDROXYZINE HCL 25 MG PO TABS: 25.0000 mg | ORAL_TABLET | ORAL | Status: DC | PRN

## 2011-01-23 NOTE — Telephone Encounter (Signed)
refills  

## 2011-05-29 ENCOUNTER — Other Ambulatory Visit: Payer: Self-pay | Admitting: *Deleted

## 2011-05-29 DIAGNOSIS — E785 Hyperlipidemia, unspecified: Secondary | ICD-10-CM

## 2011-06-07 ENCOUNTER — Other Ambulatory Visit (INDEPENDENT_AMBULATORY_CARE_PROVIDER_SITE_OTHER): Payer: 59

## 2011-06-07 DIAGNOSIS — E785 Hyperlipidemia, unspecified: Secondary | ICD-10-CM

## 2011-06-07 LAB — LIPID PANEL
HDL: 51.2 mg/dL (ref >39.00)
LDL Cholesterol: 71 mg/dL (ref 0–99)
Total CHOL/HDL Ratio: 3
Triglycerides: 116 mg/dL (ref 0.0–149.0)
VLDL: 23.2 mg/dL (ref 0.0–40.0)

## 2011-06-07 LAB — BASIC METABOLIC PANEL
Calcium: 9.7 mg/dL (ref 8.4–10.5)
Creatinine, Ser: 0.8 mg/dL (ref 0.4–1.2)
GFR: 82.69 mL/min (ref >60.00)
Sodium: 143 mEq/L (ref 135–145)

## 2011-06-07 LAB — HEPATIC FUNCTION PANEL
Alkaline Phosphatase: 63 U/L (ref 39–117)
Bilirubin, Direct: 0 mg/dL (ref 0.0–0.3)
Total Bilirubin: 0.5 mg/dL (ref 0.3–1.2)
Total Protein: 7.2 g/dL (ref 6.0–8.3)

## 2011-06-07 LAB — BRAIN NATRIURETIC PEPTIDE: Pro B Natriuretic peptide (BNP): 68 pg/mL (ref 0.0–100.0)

## 2011-06-07 LAB — GLUCOSE, RANDOM: Glucose, Bld: 101 mg/dL — ABNORMAL HIGH (ref 70–99)

## 2011-06-17 ENCOUNTER — Ambulatory Visit (INDEPENDENT_AMBULATORY_CARE_PROVIDER_SITE_OTHER): Payer: 59 | Admitting: Cardiovascular Disease

## 2011-06-17 ENCOUNTER — Encounter: Payer: Self-pay | Admitting: Cardiovascular Disease

## 2011-06-17 VITALS — BP 144/76 | HR 82 | Wt 142.0 lb

## 2011-06-17 DIAGNOSIS — E785 Hyperlipidemia, unspecified: Secondary | ICD-10-CM

## 2011-06-17 DIAGNOSIS — I4510 Unspecified right bundle-branch block: Secondary | ICD-10-CM

## 2011-06-17 NOTE — Assessment & Plan Note (Signed)
Stable yearly ECG no evidence of high grade heart block

## 2011-06-17 NOTE — Progress Notes (Signed)
Patient ID: Teresa Booker, female   DOB: 06-11-1945, 66 y.o.   MRN: [id number] Teresa Booker is seen today for F/U of cholesterol and abnormal ECG. She goes by "Teresa Booker" Her LDL is great at 76 with normal LFTs and no side effects from low dose crestor. Reviewed labs form 06/07/11  She took early retirement from [employer] and her stress level is much lower. ECG has shown RSR' with no changes and is probably normal for her. No SSCP, palpitations, dyspnea.  Previous palpable aorta but [place] with no AAA Back on weight watchers and doing well.    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  All other systems reviewed and negative  General: Affect appropriate Healthy:  appears stated age HEENT: normal Neck supple with no adenopathy JVP normal no bruits no thyromegaly Lungs clear with no wheezing and good diaphragmatic motion Heart:  S1/S2 no murmur, no rub, gallop or click PMI normal Abdomen: benighn, BS positve, no tenderness, no AAA no bruit.  No HSM or HJR Distal pulses intact with no bruits No edema Neuro non-focal Skin warm and dry No muscular weakness   Current Outpatient Prescriptions  Medication Sig Dispense Refill  . aspirin 81 MG tablet Take 81 mg by mouth daily.      . Calcium Carbonate-Vitamin D (CALTRATE 600+D) 600-400 MG-UNIT per tablet Take 1 tablet by mouth daily.        . Diphenhydramine-APAP, sleep, (ACETAMINOPHEN PM PO) Take by mouth.        . Echinacea 125 MG CAPS Take by mouth.        . fluticasone (FLONASE) 50 MCG/ACT nasal spray Place 2 sprays into the nose daily.  16 g  2  . hydrOXYzine (ATARAX/VISTARIL) 25 MG tablet Take 25 mg by mouth daily.      Marland Kitchen Hyoscyamine Sulfate 0.375 MG TBCR Take 1 tablet (0.375 mg total) by mouth 2 (two) times daily.  180 each  3  .  loratadine (CLARITIN) 10 MG tablet Take 1 tablet (10 mg total) by mouth daily.  90 tablet  3  . Lysine HCl 500 MG TABS 1 tab po qd       . methocarbamol (ROBAXIN) 500 MG tablet Take 1 tablet (500 mg total) by mouth as needed.      . Multiple Vitamin (MULTIVITAMIN) capsule Take 1 capsule by mouth daily.        Marland Kitchen omeprazole (PRILOSEC) 20 MG capsule Take 20 mg by mouth daily.        Marland Kitchen Phenylephrine-Guaifenesin (DUOMAX PO) Take 2 tablets by mouth daily.      Marland Kitchen POTASSIUM GLUCONATE PO Take 1 tablet by mouth daily.       . PredniSONE 10 MG KIT Take 1 kit by mouth as needed.      . rosuvastatin (CRESTOR) 5 MG tablet Take 1 tablet (5 mg total) by mouth daily.  90 tablet  2    Allergies  Aspirin; Codeine; Penicillins; and Sulfonamide derivatives  Electrocardiogram:  NSR rate 81 RSR; no change from 2012  Assessment and Plan

## 2011-06-17 NOTE — Assessment & Plan Note (Signed)
Cholesterol is at goal.  Continue current dose of statin and diet Rx.  No myalgias or side effects.  F/U  LFT's in 6 months. Lab Results  Component Value Date   LDLCALC 71 06/07/2011             

## 2011-06-17 NOTE — Patient Instructions (Addendum)
Your physician wants you to follow-up in:  YEAR WITH DR Haywood Filler will receive a reminder letter in the mail two months in advance. If you don't receive a letter, please call our office to schedule the follow-up appointment. Your physician recommends that you continue on your current medications as directed. Please refer to the Current Medication list given to you today. .Thank you for enrolling in MyChart. Please follow the instructions below to securely access your online medical record. MyChart allows you to send messages to your doctor, view your test results, renew your prescriptions, schedule appointments, and more.  How Do I Sign Up? 1. In your Internet browser, go to [email or web address] 2. Click on the New  User? link in the Sign In box.  3. Enter your MyChart Access Code exactly as it appears below. You will not need to use this code after you have completed the sign-up process. If you do not sign up before the expiration date, you must request a new code. MyChart Access Code: [email or web address] Expires: 03/01/2012  6:12 PM  4. Enter the last four digits of your Social Security Number (xxxx) and Date of Birth (mm/dd/yyyy) as indicated and click Next. You will be taken to the next sign-up page. 5. Create a MyChart ID. This will be your MyChart login ID and cannot be changed, so think of one that is secure and easy to remember. 6. Create a MyChart password. You can change your password at any time. 7. Enter your Password Reset Question and Answer and click Next. This can be used at a later time if you forget your password.  8. Select your communication preference, and if applicable enter your e-mail address. You will receive e-mail notification when new information is available in MyChart by choosing to receive e-mail notifications and filling in your e-mail. 9. Click Sign In. You can now view your medical record.   Additional Information If you have questions, you can  email [email or web address] or call [phone] to talk to our MyChart staff. Remember, MyChart is NOT to be used for urgent needs. For medical emergencies, dial 911.

## 2011-07-17 ENCOUNTER — Other Ambulatory Visit: Payer: Self-pay | Admitting: Obstetrics & Gynecology

## 2011-07-17 DIAGNOSIS — Z1231 Encounter for screening mammogram for malignant neoplasm of breast: Secondary | ICD-10-CM

## 2011-07-19 ENCOUNTER — Ambulatory Visit
Admission: RE | Admit: 2011-07-19 | Discharge: 2011-07-19 | Disposition: A | Discharge status: Home / Self Care | Payer: 59 | Source: Ambulatory Visit | Attending: Obstetrics & Gynecology | Admitting: Obstetrics & Gynecology

## 2011-07-19 DIAGNOSIS — Z1231 Encounter for screening mammogram for malignant neoplasm of breast: Secondary | ICD-10-CM

## 2011-08-28 ENCOUNTER — Encounter: Payer: Self-pay | Admitting: Gastroenterology

## 2011-09-23 ENCOUNTER — Other Ambulatory Visit: Payer: Self-pay | Admitting: *Deleted

## 2011-09-23 MED ORDER — ROSUVASTATIN CALCIUM 5 MG PO TABS: 5.0000 mg | ORAL_TABLET | Freq: Every day | ORAL | Status: DC

## 2012-02-21 ENCOUNTER — Other Ambulatory Visit: Payer: Self-pay | Admitting: *Deleted

## 2012-02-21 MED ORDER — HYDROXYZINE HCL 25 MG PO TABS: 25.0000 mg | ORAL_TABLET | Freq: Every day | ORAL | Status: DC

## 2012-02-21 MED ORDER — FLUTICASONE PROPIONATE 50 MCG/ACT NA SUSP: 2.0000 | Freq: Every day | NASAL | Status: DC

## 2012-02-27 ENCOUNTER — Telehealth: Payer: Self-pay | Admitting: *Deleted

## 2012-02-27 NOTE — Telephone Encounter (Signed)
Pt requesting refills of Crestor, Hysocyamine, and Methocarbam. Last office visit was 08/14/10. Please advise.

## 2012-02-28 ENCOUNTER — Other Ambulatory Visit: Payer: Self-pay | Admitting: *Deleted

## 2012-02-28 MED ORDER — ROSUVASTATIN CALCIUM 5 MG PO TABS: 5.0000 mg | ORAL_TABLET | Freq: Every day | ORAL | Status: DC

## 2012-02-28 MED ORDER — HYOSCYAMINE SULFATE ER 0.375 MG PO TBCR: 1.0000 | EXTENDED_RELEASE_TABLET | Freq: Two times a day (BID) | ORAL | Status: DC

## 2012-02-28 MED ORDER — METHOCARBAMOL 500 MG PO TABS: 500.0000 mg | ORAL_TABLET | ORAL | Status: DC | PRN

## 2012-02-28 NOTE — Telephone Encounter (Signed)
OKI for refills on all meds requested. Thanks

## 2012-03-28 ENCOUNTER — Other Ambulatory Visit: Payer: Self-pay

## 2012-04-20 ENCOUNTER — Encounter: Payer: Self-pay | Admitting: Internal Medicine

## 2012-04-22 ENCOUNTER — Encounter: Payer: Self-pay | Admitting: Internal Medicine

## 2012-04-22 MED ORDER — HYOSCYAMINE SULFATE ER 0.375 MG PO TBCR: 1.0000 | EXTENDED_RELEASE_TABLET | Freq: Two times a day (BID) | ORAL | Status: DC

## 2012-05-05 ENCOUNTER — Other Ambulatory Visit: Payer: Self-pay | Admitting: *Deleted

## 2012-05-05 MED ORDER — FLUTICASONE PROPIONATE 50 MCG/ACT NA SUSP: 2.0000 | Freq: Every day | NASAL | Status: DC

## 2012-05-21 ENCOUNTER — Encounter: Payer: Self-pay | Admitting: Gastroenterology

## 2012-07-23 ENCOUNTER — Other Ambulatory Visit: Payer: Self-pay

## 2012-07-23 ENCOUNTER — Encounter: Payer: Self-pay | Admitting: Cardiovascular Disease

## 2012-07-23 DIAGNOSIS — Z1231 Encounter for screening mammogram for malignant neoplasm of breast: Secondary | ICD-10-CM

## 2012-08-31 ENCOUNTER — Ambulatory Visit
Admission: RE | Admit: 2012-08-31 | Discharge: 2012-08-31 | Disposition: A | Discharge status: Home / Self Care | Payer: 59 | Source: Ambulatory Visit

## 2012-08-31 DIAGNOSIS — Z1231 Encounter for screening mammogram for malignant neoplasm of breast: Secondary | ICD-10-CM

## 2012-09-10 ENCOUNTER — Encounter: Payer: Self-pay | Admitting: Internal Medicine

## 2012-09-10 ENCOUNTER — Ambulatory Visit (INDEPENDENT_AMBULATORY_CARE_PROVIDER_SITE_OTHER): Payer: 59 | Admitting: Internal Medicine

## 2012-09-10 VITALS — BP 110/68 | HR 86 | Temp 98.3°F | Wt 155.4 lb

## 2012-09-10 DIAGNOSIS — J069 Acute upper respiratory infection, unspecified: Secondary | ICD-10-CM

## 2012-09-10 MED ORDER — SULFAMETHOXAZOLE-TMP DS 800-160 MG PO TABS: 1.0000 | ORAL_TABLET | Freq: Two times a day (BID) | ORAL | Status: DC

## 2012-09-10 MED ORDER — MOMETASONE FUROATE 50 MCG/ACT NA SUSP: 2.0000 | Freq: Every day | NASAL | Status: DC

## 2012-09-10 NOTE — Patient Instructions (Addendum)
Upper respiratory infection - very tender maxillary sinus, R>L. Lungs are clear  Plan Septra DS twice a day for five days  Sudafed (generic) 30 mg 2 or 3 times a day  Nasal saline  Continue your routine allergy medications and herbals  Try a stove top vaporizer:  A dollop of Vicks into a pot of water taken off the boil. Make a towel head tent and inhale.   Upper Respiratory Infection, Adult An upper respiratory infection (URI) is also sometimes known as the common cold. The upper respiratory tract includes the nose, sinuses, throat, trachea, and bronchi. Bronchi are the airways leading to the lungs. Most people improve within 1 week, but symptoms can last up to 2 weeks. A residual cough may last even longer.  CAUSES Many different viruses can infect the tissues lining the upper respiratory tract. The tissues become irritated and inflamed and often become very moist. Mucus production is also common. A cold is contagious. You can easily spread the virus to others by oral contact. This includes kissing, sharing a glass, coughing, or sneezing. Touching your mouth or nose and then touching a surface, which is then touched by another person, can also spread the virus. SYMPTOMS  Symptoms typically develop 1 to 3 days after you come in contact with a cold virus. Symptoms vary from person to person. They may include:  Runny nose.  Sneezing.  Nasal congestion.  Sinus irritation.  Sore throat.  Loss of voice (laryngitis).  Cough.  Fatigue.  Muscle aches.  Loss of appetite.  Headache.  Low-grade fever. DIAGNOSIS  You might diagnose your own cold based on familiar symptoms, since most people get a cold 2 to 3 times a year. Your caregiver can confirm this based on your exam. Most importantly, your caregiver can check that your symptoms are not due to another disease such as strep throat, sinusitis, pneumonia, asthma, or epiglottitis. Blood tests, throat tests, and X-rays are not necessary to  diagnose a common cold, but they may sometimes be helpful in excluding other more serious diseases. Your caregiver will decide if any further tests are required. RISKS AND COMPLICATIONS  You may be at risk for a more severe case of the common cold if you smoke cigarettes, have chronic heart disease (such as heart failure) or lung disease (such as asthma), or if you have a weakened immune system. The very young and very old are also at risk for more serious infections. Bacterial sinusitis, middle ear infections, and bacterial pneumonia can complicate the common cold. The common cold can worsen asthma and chronic obstructive pulmonary disease (COPD). Sometimes, these complications can require emergency medical care and may be life-threatening. PREVENTION  The best way to protect against getting a cold is to practice good hygiene. Avoid oral or hand contact with people with cold symptoms. Wash your hands often if contact occurs. There is no clear evidence that vitamin C, vitamin E, echinacea, or exercise reduces the chance of developing a cold. However, it is always recommended to get plenty of rest and practice good nutrition. TREATMENT  Treatment is directed at relieving symptoms. There is no cure. Antibiotics are not effective, because the infection is caused by a virus, not by bacteria. Treatment may include:  Increased fluid intake. Sports drinks offer valuable electrolytes, sugars, and fluids.  Breathing heated mist or steam (vaporizer or shower).  Eating chicken soup or other clear broths, and maintaining good nutrition.  Getting plenty of rest.  Using gargles or lozenges for comfort.  Controlling fevers with ibuprofen or acetaminophen as directed by your caregiver.  Increasing usage of your inhaler if you have asthma. Zinc gel and zinc lozenges, taken in the first 24 hours of the common cold, can shorten the duration and lessen the severity of symptoms. Pain medicines may help with fever,  muscle aches, and throat pain. A variety of non-prescription medicines are available to treat congestion and runny nose. Your caregiver can make recommendations and may suggest nasal or lung inhalers for other symptoms.  HOME CARE INSTRUCTIONS   Only take over-the-counter or prescription medicines for pain, discomfort, or fever as directed by your caregiver.  Use a warm mist humidifier or inhale steam from a shower to increase air moisture. This may keep secretions moist and make it easier to breathe.  Drink enough water and fluids to keep your urine clear or pale yellow.  Rest as needed.  Return to work when your temperature has returned to normal or as your caregiver advises. You may need to stay home longer to avoid infecting others. You can also use a face mask and careful hand washing to prevent spread of the virus. SEEK MEDICAL CARE IF:   After the first few days, you feel you are getting worse rather than better.  You need your caregiver's advice about medicines to control symptoms.  You develop chills, worsening shortness of breath, or brown or red sputum. These may be signs of pneumonia.  You develop yellow or brown nasal discharge or pain in the face, especially when you bend forward. These may be signs of sinusitis.  You develop a fever, swollen neck glands, pain with swallowing, or white areas in the back of your throat. These may be signs of strep throat. SEEK IMMEDIATE MEDICAL CARE IF:   You have a fever.  You develop severe or persistent headache, ear pain, sinus pain, or chest pain.  You develop wheezing, a prolonged cough, cough up blood, or have a change in your usual mucus (if you have chronic lung disease).  You develop sore muscles or a stiff neck. Document Released: 07/24/2000 Document Revised: 04/22/2011 Document Reviewed: 06/01/2010 [facility] Patient Information 2014 [place], [place].

## 2012-09-10 NOTE — Progress Notes (Signed)
  Subjective:    Patient ID: Teresa Booker, female    DOB: May 09, 1945, 67 y.o.   MRN: [id number]  HPI Patient present for URI symptoms: temp to 100 last night, facial pressure, no drainage - feels congested. A little non-productive coughing. No N/V/D. No SOB.  PMH, FamHx and SocHx reviewed for any changes and relevance.  Current Outpatient Prescriptions on File Prior to Visit  Medication Sig Dispense Refill  . aspirin 81 MG tablet Take 81 mg by mouth daily.      . Calcium Carbonate-Vitamin D (CALTRATE 600+D) 600-400 MG-UNIT per tablet Take 1 tablet by mouth daily.        . Diphenhydramine-APAP, sleep, (ACETAMINOPHEN PM PO) Take by mouth.        . Echinacea 125 MG CAPS Take by mouth.        . fluticasone (FLONASE) 50 MCG/ACT nasal spray Place 2 sprays into the nose daily.  16 g  1  . hydrOXYzine (ATARAX/VISTARIL) 25 MG tablet Take 1 tablet (25 mg total) by mouth daily.  30 tablet  1  . Hyoscyamine Sulfate 0.375 MG TBCR Take 1 tablet (0.375 mg total) by mouth 2 (two) times daily.  180 each  3  . loratadine (CLARITIN) 10 MG tablet Take 1 tablet (10 mg total) by mouth daily.  90 tablet  3  . Lysine HCl 500 MG TABS 1 tab po qd       . methocarbamol (ROBAXIN) 500 MG tablet Take 1 tablet (500 mg total) by mouth as needed.  60 tablet  2  . Multiple Vitamin (MULTIVITAMIN) capsule Take 1 capsule by mouth daily.        Marland Kitchen omeprazole (PRILOSEC) 20 MG capsule Take 20 mg by mouth daily.        Marland Kitchen Phenylephrine-Guaifenesin (DUOMAX PO) Take 2 tablets by mouth daily.      Marland Kitchen POTASSIUM GLUCONATE PO Take 1 tablet by mouth daily.       . PredniSONE 10 MG KIT Take 1 kit by mouth as needed.      . rosuvastatin (CRESTOR) 5 MG tablet Take 1 tablet (5 mg total) by mouth daily.  90 tablet  2   No current facility-administered medications on file prior to visit.      Review of Systems System review is negative for any constitutional, cardiac, pulmonary, GI or neuro symptoms or complaints other than as described in  the HPI.     Objective:   Physical Exam Filed Vitals:   09/10/12 1628  BP: 110/68  Pulse: 86  Temp: 98.3 F (36.8 C)   Gen'l- WNWD white woman in no distress HEENT_ tender to percussion over maxillary sinus R>L Nodes - negative Pulm - clear to A&P, no wheeze.        Assessment & Plan:  Upper respiratory infection - very tender maxillary sinus, R>L. Lungs are clear  Plan Septra DS twice a day for five days  Sudafed (generic) 30 mg 2 or 3 times a day  Nasal saline  Continue your routine allergy medications and herbals  Try a stove top vaporizer:  A dollop of Vicks into a pot of water taken off the boil. Make a towel head tent and inhale.

## 2012-09-16 ENCOUNTER — Other Ambulatory Visit: Payer: Self-pay

## 2012-09-25 ENCOUNTER — Telehealth: Payer: Self-pay | Admitting: *Deleted

## 2012-09-25 NOTE — Telephone Encounter (Signed)
Pt called states she was diagnosed with a sinus infection, after medication regiment she is not feeling any better.  Please advise

## 2012-09-27 ENCOUNTER — Encounter: Payer: Self-pay | Admitting: Internal Medicine

## 2012-09-28 ENCOUNTER — Ambulatory Visit (INDEPENDENT_AMBULATORY_CARE_PROVIDER_SITE_OTHER): Payer: 59 | Admitting: Internal Medicine

## 2012-09-28 ENCOUNTER — Encounter: Payer: Self-pay | Admitting: Internal Medicine

## 2012-09-28 VITALS — BP 130/58 | HR 71 | Temp 97.7°F | Wt 155.0 lb

## 2012-09-28 DIAGNOSIS — J309 Allergic rhinitis, unspecified: Secondary | ICD-10-CM

## 2012-09-28 DIAGNOSIS — R05 Cough: Secondary | ICD-10-CM

## 2012-09-28 DIAGNOSIS — R059 Cough, unspecified: Secondary | ICD-10-CM

## 2012-09-28 MED ORDER — HYDROXYZINE HCL 25 MG PO TABS: 25.0000 mg | ORAL_TABLET | Freq: Every day | ORAL | Status: DC

## 2012-09-28 MED ORDER — BENZONATATE 100 MG PO CAPS: 100.0000 mg | ORAL_CAPSULE | Freq: Three times a day (TID) | ORAL | Status: DC | PRN

## 2012-09-28 MED ORDER — FLUTICASONE PROPIONATE 50 MCG/ACT NA SUSP: 2.0000 | Freq: Every day | NASAL | Status: DC

## 2012-09-28 NOTE — Telephone Encounter (Signed)
May add on to schedule for Monday 8/18.

## 2012-09-28 NOTE — Telephone Encounter (Signed)
Left message for pt to call back and schedule

## 2012-09-28 NOTE — Progress Notes (Signed)
Subjective:     Patient ID: Teresa Booker, female   DOB: Sep 18, 1945, 67 y.o.   MRN: [id number]  HPI Teresa Booker is a 67 year-old lady here following up two weeks after being diagnosed with a sinus infection.  At that time, she was given antibiotics.  Her symptoms resolved but returned on friday.  She has had a sore throat and headaches, both of which have slightly improved since Friday; she reports feeling achy Friday and Saturday, but that has resolved.  She does not have a fever or sinus pressure at this time.  She describes her cough as a dry "tickle cough" which bothers her most at night.  She states that Mucinex DM has helped and she takes daytime antihistamine and hydroxyzine (Atarax) at night and says she thinks these help.  She has not noticed any exacerbating factors.  She denies any sweating, chills, no fever, shaking, shortness of breath, wheezing, difficulty breathing or lymphadenopathy.  She endorses seasonal allergies to grass, cats and dust; her allergies normally cause her nose to run and eyes to water.    Review of Systems Constitutional: negative, per HPI HEENT: no changes in hearing, increased tinnitus above baseline, ear pain, visual disturbances or acute changes in vision Pulm: negative, per HPI GI: No difficulty swallowing/chewing, reflux no worse than normal, no C/D, no blood in stool.  Past Medical History  Diagnosis Date   RBBB (right bundle branch block)    Hyperlipemia    Postnasal drip    IBS (irritable bowel syndrome)    Rotator cuff syndrome     right   Allergic reaction    Alcohol abuse     - abstemious 20 years. Attends AA - strong recovery    Past Surgical History  Procedure Laterality Date   Mouth surgery  2002   Foot surgery     Ankle surgery  1986   Rotator cuff repair  10-24-05    had bone spur and laceration off cuff. Daldorf   Family History  Problem Relation Age of Onset   Stroke     Aneurysm     Lung cancer     Hyperlipidemia  Mother    Irritable bowel syndrome Mother    Coronary artery disease Mother    Heart attack Mother    Aneurysm Mother     brain   Diabetes Brother    Cancer Brother     small cell lung ca with mets/ smoker   History   Social History   Marital Status: Single    Spouse Name: N/A    Number of Children: N/A   Years of Education: 16   Occupational History   Production designer, theatre/television/film     retired   Social History Main Topics   Smoking status: Former Smoker   Smokeless tobacco: Never Used     Comment: quit appox 10 yrs ago   Alcohol Use: No     Comment: 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   Drug Use: No   Sexual Activity: Yes    Partners: Female   Other Topics Concern   Not on file   Social History Narrative   [employer]   Long-term relationship - '95    Currently attending classes in medical coding (May '12)       Objective:   Physical Exam Well-appearing lady in NAD HEENT: TMs nl, no lymphadenopathy, maxillary sinus tender R>L, frontal sinus was non-tender Neck: neck supple, no thyromegaly, no cervical adenopathy CV: nl  RRR, nl S1/S2, no gallops, rubs or murmurs Pulm: Bilaterally clear to auscultation, breath sounds normal, no respiratory distress, no wheezes, rales, crackles or chest tenderness    Assessment and Plan     According to the history and examination, it appears that the infection has been cleared.  The cough (like a tickle and non-productive for mucus) sounds more like an allergic reaction than an infection.  Plan: Use Flonase nasal spray to reduce cough and sinus inflammation.  2 sprays per day. Take sudafed 30 mg, twice per day.   Take one Tessalon Perle capsule, up to three times per day, as needed.

## 2012-09-28 NOTE — Telephone Encounter (Signed)
Scheduled for 8.18.14 @ 4:15 as per Dr Debby Bud

## 2012-09-28 NOTE — Patient Instructions (Addendum)
Thanks for coming in and working with me Teresa Booker) today!  It sounds like your sinuses are no longer actively infected.  Your cough (feeling like a tickle without mucus) sounds more like an allergic reaction than an infection.  The Flonase nasal steroid spray will help prevent your body from continuing to produce mucus, but this takes several days to take effect.  So, in the interim, you should take sudafed to help prevent the secretions from bothering your sinuses.  Additionally, you can take a Tessalon Perle to reduce the scratchy, irritated cough.    Plan: Use Flonase nasal spray to reduce cough and sinus inflammation. Use sudafed 30 mg, twice per 2x/day.   Take Tessalon Perles three times per day, as needed.  If you have a fever or dark mucus, you should follow-up with [place].  These indicate a recurrent bacterial infection.

## 2012-09-29 NOTE — Assessment & Plan Note (Signed)
Persistent and recurrent cough with post-nasal drainage and sinus congestion.  Plan: Use Flonase nasal spray to reduce cough and sinus inflammation. Use sudafed 30 mg, twice per 2x/day.   Take Tessalon Perles three times per day, as needed.  If you have a fever or dark mucus, you should follow-up with [place].  These indicate a recurrent bacterial infection.

## 2012-10-14 ENCOUNTER — Ambulatory Visit: Payer: 59 | Admitting: Cardiovascular Disease

## 2012-11-18 ENCOUNTER — Ambulatory Visit (INDEPENDENT_AMBULATORY_CARE_PROVIDER_SITE_OTHER): Payer: 59 | Admitting: Cardiovascular Disease

## 2012-11-18 ENCOUNTER — Encounter: Payer: Self-pay | Admitting: Cardiovascular Disease

## 2012-11-18 VITALS — BP 170/70 | Ht 66.0 in | Wt 155.0 lb

## 2012-11-18 DIAGNOSIS — J309 Allergic rhinitis, unspecified: Secondary | ICD-10-CM

## 2012-11-18 DIAGNOSIS — R030 Elevated blood-pressure reading, without diagnosis of hypertension: Secondary | ICD-10-CM

## 2012-11-18 DIAGNOSIS — IMO0001 Reserved for inherently not codable concepts without codable children: Secondary | ICD-10-CM | POA: Insufficient documentation

## 2012-11-18 DIAGNOSIS — E785 Hyperlipidemia, unspecified: Secondary | ICD-10-CM

## 2012-11-18 DIAGNOSIS — I4510 Unspecified right bundle-branch block: Secondary | ICD-10-CM

## 2012-11-18 NOTE — Patient Instructions (Signed)
Your physician wants you to follow-up in:  6 MONTHS WITH DR NISHAN  You will receive a reminder letter in the mail two months in advance. If you don't receive a letter, please call our office to schedule the follow-up appointment. Your physician recommends that you continue on your current medications as directed. Please refer to the Current Medication list given to you today. 

## 2012-11-18 NOTE — Assessment & Plan Note (Signed)
Continue crestor She will bring in recent lab work for me to review

## 2012-11-18 NOTE — Progress Notes (Signed)
Patient ID: Teresa Booker, female   DOB: 1945-02-26, 67 y.o.   MRN: [id number] [name] is seen today for F/U of cholesterol and abnormal ECG. She goes by "Teresa Booker" Her LDL is great at 76 with normal LFTs and no side effects from low dose crestor. Reviewed labs form 06/07/11 She took early retirement from [employer] and her stress level is much lower. ECG has shown RSR' with no changes and is probably normal for her. No SSCP, palpitations, dyspnea. Previous palpable aorta but [place] with no AAA Back on weight watchers and doing well.  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    ROS: Denies fever, malais, weight loss, blurry vision, decreased visual acuity, cough, sputum, SOB, hemoptysis, pleuritic pain, palpitaitons, heartburn, abdominal pain, melena, lower extremity edema, claudication, or rash.  All other systems reviewed and negative  General: Affect appropriate Healthy:  appears stated age HEENT: normal Neck supple with no adenopathy JVP normal no bruits no thyromegaly Lungs clear with no wheezing and good diaphragmatic motion Heart:  S1/S2 no murmur, no rub, gallop or click PMI normal Abdomen: benighn, BS positve, no tenderness, no AAA no bruit.  No HSM or HJR Distal pulses intact with no bruits No edema Neuro non-focal Skin warm and dry No muscular weakness   Current Outpatient Prescriptions  Medication Sig Dispense Refill  . aspirin 81 MG tablet Take 81 mg by mouth daily.      . benzonatate (TESSALON) 100 MG capsule Take 1 capsule (100 mg total) by mouth 3 (three) times daily as needed for cough.  30 capsule  3  . Calcium Carbonate-Vitamin D (CALTRATE 600+D) 600-400 MG-UNIT per tablet Take 1 tablet by mouth daily.        . Diphenhydramine-APAP, sleep, (ACETAMINOPHEN PM PO) Take by mouth.        . Echinacea 125 MG CAPS Take by mouth.        .  fluticasone (FLONASE) 50 MCG/ACT nasal spray Place 2 sprays into the nose daily.  48 g  3  . hydrOXYzine (ATARAX/VISTARIL) 25 MG tablet Take 1 tablet (25 mg total) by mouth daily.  90 tablet  3  . Hyoscyamine Sulfate 0.375 MG TBCR Take 1 tablet (0.375 mg total) by mouth 2 (two) times daily.  180 each  3  . loratadine (CLARITIN) 10 MG tablet Take 1 tablet (10 mg total) by mouth daily.  90 tablet  3  . Lysine HCl 500 MG TABS 1 tab po qd       . methocarbamol (ROBAXIN) 500 MG tablet Take 1 tablet (500 mg total) by mouth as needed.  60 tablet  2  . mometasone (NASONEX) 50 MCG/ACT nasal spray Place 2 sprays into the nose daily.  17 g  12  . Multiple Vitamin (MULTIVITAMIN) capsule Take 1 capsule by mouth daily.        Marland Kitchen omeprazole (PRILOSEC) 20 MG capsule Take 20 mg by mouth daily.        . PredniSONE 10 MG KIT Take 1 kit by mouth as needed.      . rosuvastatin (CRESTOR) 5 MG tablet Take 1 tablet (5 mg total) by mouth daily.  90 tablet  2   No current facility-administered medications for this visit.    Allergies  Aspirin; Codeine; Penicillins; and Sulfonamide derivatives  Electrocardiogram:  SR rate 80  RSR; otherwise normal ECG   Assessment and Plan

## 2012-11-18 NOTE — Assessment & Plan Note (Signed)
Consider NettyPot claritin, allegra or zyrtec ok with no pseudofed

## 2012-11-18 NOTE — Assessment & Plan Note (Signed)
Stable only RSR' on ECG today

## 2012-11-18 NOTE — Assessment & Plan Note (Signed)
Needs home recordings due to white coat syndrome Teresa Booker can check at home  Low sodium diet Suspect she will need to start BP meds

## 2012-12-17 ENCOUNTER — Other Ambulatory Visit: Payer: Self-pay

## 2012-12-29 ENCOUNTER — Encounter: Payer: Self-pay | Admitting: *Deleted

## 2013-01-01 ENCOUNTER — Encounter: Payer: Self-pay | Admitting: *Deleted

## 2013-03-05 ENCOUNTER — Ambulatory Visit (INDEPENDENT_AMBULATORY_CARE_PROVIDER_SITE_OTHER): Payer: 59 | Admitting: Internal Medicine

## 2013-03-05 ENCOUNTER — Encounter: Payer: Self-pay | Admitting: Internal Medicine

## 2013-03-05 VITALS — BP 140/70 | HR 90 | Temp 97.0°F | Ht 66.0 in | Wt 156.0 lb

## 2013-03-05 DIAGNOSIS — J019 Acute sinusitis, unspecified: Secondary | ICD-10-CM | POA: Insufficient documentation

## 2013-03-05 MED ORDER — AZITHROMYCIN 250 MG PO TABS
ORAL_TABLET | ORAL | Status: DC
Start: 2013-03-05 — End: 2013-06-09

## 2013-03-05 NOTE — Assessment & Plan Note (Signed)
Mild to mod, for antibx course,  to f/u any worsening symptoms or concerns 

## 2013-03-05 NOTE — Progress Notes (Signed)
Subjective:    Patient ID: Teresa Booker, female    DOB: 1945/09/16, 68 y.o.   MRN: [id number]  HPI   Here with 6 days acute onset fever, facial pain, pressure, headache, general weakness and malaise, and greenish d/c, with mild ST and cough, but pt denies chest pain, wheezing, increased sob or doe, orthopnea, PND, increased LE swelling, palpitations, dizziness or syncope.   Past Medical History  Diagnosis Date  . RBBB (right bundle branch block)   . Hyperlipemia   . Postnasal drip   . IBS (irritable bowel syndrome)   . Rotator cuff syndrome     right  . Allergic reaction   . Alcohol abuse     - abstemious 20 years. Attends AA - strong recovery    Past Surgical History  Procedure Laterality Date  . Mouth surgery  2002  . Foot surgery    . Ankle surgery  1986  . Rotator cuff repair  10-24-05    had bone spur and laceration off cuff. [place]orf    reports that she has quit smoking. She has never used smokeless tobacco. She reports that she does not drink alcohol or use illicit drugs. family history includes Aneurysm in her mother and another family member; Cancer in her brother; Coronary artery disease in her mother; Diabetes in her brother; Heart attack in her mother; Hyperlipidemia in her mother; Irritable bowel syndrome in her mother; Lung cancer in an other family member; Stroke in an other family member. Allergies  Allergen Reactions  . Aspirin   . Codeine   . Penicillins   . Sulfonamide Derivatives     Has a rash with the use of silvadiene for burn   Current Outpatient Prescriptions on File Prior to Visit  Medication Sig Dispense Refill  . aspirin 81 MG tablet Take 81 mg by mouth daily.      . benzonatate (TESSALON) 100 MG capsule Take 1 capsule (100 mg total) by mouth 3 (three) times daily as needed for cough.  30 capsule  3  . Calcium Carbonate-Vitamin D (CALTRATE 600+D) 600-400 MG-UNIT per tablet Take 1 tablet by mouth daily.        . Diphenhydramine-APAP, sleep,  (ACETAMINOPHEN PM PO) Take by mouth.        . Echinacea 125 MG CAPS Take by mouth.        . fluticasone (FLONASE) 50 MCG/ACT nasal spray Place 2 sprays into the nose daily.  48 g  3  . hydrOXYzine (ATARAX/VISTARIL) 25 MG tablet Take 1 tablet (25 mg total) by mouth daily.  90 tablet  3  . Hyoscyamine Sulfate 0.375 MG TBCR Take 1 tablet (0.375 mg total) by mouth 2 (two) times daily.  180 each  3  . loratadine (CLARITIN) 10 MG tablet Take 1 tablet (10 mg total) by mouth daily.  90 tablet  3  . Lysine HCl 500 MG TABS 1 tab po qd       . methocarbamol (ROBAXIN) 500 MG tablet Take 1 tablet (500 mg total) by mouth as needed.  60 tablet  2  . mometasone (NASONEX) 50 MCG/ACT nasal spray Place 2 sprays into the nose daily.  17 g  12  . Multiple Vitamin (MULTIVITAMIN) capsule Take 1 capsule by mouth daily.        Marland Kitchen omeprazole (PRILOSEC) 20 MG capsule Take 20 mg by mouth daily.        . PredniSONE 10 MG KIT Take 1 kit by mouth as needed.      Marland Kitchen  rosuvastatin (CRESTOR) 5 MG tablet Take 1 tablet (5 mg total) by mouth daily.  90 tablet  2   No current facility-administered medications on file prior to visit.    Review of Systems  All otherwise neg per pt        Objective:   Physical Exam BP 140/70  Pulse 90  Temp(Src) 97 F (36.1 C) (Oral)  Ht $R'5\' 6"'iQ$  (1.676 m)  Wt 156 lb (70.761 kg)  BMI 25.19 kg/m2  SpO2 98% VS noted,  Constitutional: Pt appears well-developed and well-nourished.  HENT: Head: NCAT.  Right Ear: External ear normal.  Left Ear: External ear normal.  Bilat tm's with mild erythema.  Max sinus areas mild tender.  Pharynx with mild erythema, no exudate Eyes: Conjunctivae and EOM are normal. Pupils are equal, round, and reactive to light.  Neck: Normal range of motion. Neck supple.  Cardiovascular: Normal rate and regular rhythm.   Pulmonary/Chest: Effort normal and breath sounds normal.  Neurological: Pt is alert. Not confused  Skin: Skin is warm. No erythema.  Psychiatric: Pt  behavior is normal. Thought content normal.      Assessment & Plan:

## 2013-03-05 NOTE — Patient Instructions (Addendum)
Please take all new medication as prescribed - the antibiotic Please continue all other medications as before, and refills have been done if requested. Please have the pharmacy call with any other refills you may need. You can also take Delsym OTC for cough, and/or Mucinex (or it's generic off brand) for congestion, and tylenol as needed for pain.  Please remember to sign up for My Chart if you have not done so, as this will be important to you in the future with finding out test results, communicating by private email, and scheduling acute appointments online when needed.

## 2013-03-05 NOTE — Progress Notes (Signed)
Pre-visit discussion using our clinic review tool. No additional management support is needed unless otherwise documented below in the visit note.  

## 2013-04-06 ENCOUNTER — Other Ambulatory Visit: Payer: Self-pay | Admitting: Internal Medicine

## 2013-06-09 ENCOUNTER — Ambulatory Visit (INDEPENDENT_AMBULATORY_CARE_PROVIDER_SITE_OTHER): Payer: 59 | Admitting: Cardiovascular Disease

## 2013-06-09 ENCOUNTER — Encounter: Payer: Self-pay | Admitting: Cardiovascular Disease

## 2013-06-09 VITALS — BP 140/80 | HR 84 | Ht 66.0 in | Wt 156.0 lb

## 2013-06-09 DIAGNOSIS — IMO0001 Reserved for inherently not codable concepts without codable children: Secondary | ICD-10-CM

## 2013-06-09 DIAGNOSIS — R030 Elevated blood-pressure reading, without diagnosis of hypertension: Secondary | ICD-10-CM

## 2013-06-09 DIAGNOSIS — I4510 Unspecified right bundle-branch block: Secondary | ICD-10-CM

## 2013-06-09 DIAGNOSIS — E785 Hyperlipidemia, unspecified: Secondary | ICD-10-CM

## 2013-06-09 NOTE — Assessment & Plan Note (Signed)
Labs at work she indicates LDL was under 100 with normal LFTls

## 2013-06-09 NOTE — Progress Notes (Signed)
Patient ID: Teresa Booker, female   DOB: 06-Oct-1945, 68 y.o.   MRN: [id number] Teresa Booker is seen today for F/U of cholesterol and abnormal ECG. She goes by "Teresa Booker" Her LDL is great at 76 with normal LFTs and no side effects from low dose crestor. Reviewed labs form 06/07/11 She took early retirement from [employer] and her stress level is much lower. ECG has shown RSR' with no changes and is probably normal for her. No SSCP, palpitations, dyspnea. Previous palpable aorta but [place] with no AAA Back on weight watchers and doing well.  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   Some minor anxiety attacks  BP seems to be running high Not exercising but not adding salt to diet  No excess ETOH or stimulants She is hesitant to start BP meds due to previous drug intolerances      ROS: Denies fever, malais, weight loss, blurry vision, decreased visual acuity, cough, sputum, SOB, hemoptysis, pleuritic pain, palpitaitons, heartburn, abdominal pain, melena, lower extremity edema, claudication, or rash.  All other systems reviewed and negative  General: Affect appropriate Healthy:  appears stated age 7: normal Neck supple with no adenopathy JVP normal no bruits no thyromegaly Lungs clear with no wheezing and good diaphragmatic motion Heart:  S1/S2 no murmur, no rub, gallop or click PMI normal Abdomen: benighn, BS positve, no tenderness, no AAA no bruit.  No HSM or HJR Distal pulses intact with no bruits No edema Neuro non-focal Skin warm and dry No muscular weakness   Current Outpatient Prescriptions  Medication Sig Dispense Refill  . aspirin 81 MG tablet Take 81 mg by mouth daily.      . Calcium Carbonate-Vitamin D (CALTRATE 600+D) 600-400 MG-UNIT per tablet Take 1 tablet by mouth daily.        . Diphenhydramine-APAP, sleep, (ACETAMINOPHEN PM PO) Take by  mouth.        . Echinacea 125 MG CAPS Take by mouth.        . fluticasone (FLONASE) 50 MCG/ACT nasal spray Place 2 sprays into the nose daily.  48 g  3  . hydrOXYzine (ATARAX/VISTARIL) 25 MG tablet Take 1 tablet (25 mg total) by mouth daily.  90 tablet  3  . hyoscyamine (LEVBID) 0.375 MG 12 hr tablet Take 1 tablet by mouth  twice a day  180 tablet  3  . loratadine (CLARITIN) 10 MG tablet Take 1 tablet (10 mg total) by mouth daily.  90 tablet  3  . Lysine HCl 500 MG TABS 1 tab po qd       . methocarbamol (ROBAXIN) 500 MG tablet Take 1 tablet by mouth  every 6 hours as needed for muscle spasm  270 tablet  0  . mometasone (NASONEX) 50 MCG/ACT nasal spray Place 2 sprays into the nose daily.  17 g  12  . Multiple Vitamin (MULTIVITAMIN) capsule Take 1 capsule by mouth daily.        Marland Kitchen omeprazole (PRILOSEC) 20 MG capsule Take 20 mg by mouth daily.        . rosuvastatin (CRESTOR) 5 MG tablet Take 1 tablet (5 mg total) by mouth daily.  90 tablet  3   No current facility-administered medications for this visit.    Allergies  Aspirin; Codeine; Penicillins; and Sulfonamide derivatives  Electrocardiogram:  SR rate  78  RSR'  Assessment and Plan

## 2013-06-09 NOTE — Assessment & Plan Note (Signed)
RSR' stable yearly ECG

## 2013-06-09 NOTE — Assessment & Plan Note (Signed)
Suspect she will need to start meds.  F/U next available and she will take her BP 2-3 x / week and try to increase her activity leavel to avoid need for meds

## 2013-06-09 NOTE — Patient Instructions (Addendum)
Your physician recommends that you schedule a follow-up appointment in: 3- [place] Your physician recommends that you continue on your current medications as directed. Please refer to the Current Medication list given to you today.

## 2013-06-25 ENCOUNTER — Ambulatory Visit (INDEPENDENT_AMBULATORY_CARE_PROVIDER_SITE_OTHER): Payer: 59 | Admitting: Family Medicine

## 2013-06-25 ENCOUNTER — Ambulatory Visit: Payer: 59 | Admitting: Family Medicine

## 2013-06-25 ENCOUNTER — Encounter: Payer: Self-pay | Admitting: Family Medicine

## 2013-06-25 VITALS — BP 140/74 | HR 81 | Temp 98.3°F | Wt 156.0 lb

## 2013-06-25 DIAGNOSIS — K589 Irritable bowel syndrome without diarrhea: Secondary | ICD-10-CM

## 2013-06-25 DIAGNOSIS — E785 Hyperlipidemia, unspecified: Secondary | ICD-10-CM

## 2013-06-25 DIAGNOSIS — J309 Allergic rhinitis, unspecified: Secondary | ICD-10-CM

## 2013-06-25 DIAGNOSIS — IMO0001 Reserved for inherently not codable concepts without codable children: Secondary | ICD-10-CM

## 2013-06-25 DIAGNOSIS — R300 Dysuria: Secondary | ICD-10-CM

## 2013-06-25 DIAGNOSIS — R030 Elevated blood-pressure reading, without diagnosis of hypertension: Secondary | ICD-10-CM

## 2013-06-25 LAB — POCT URINALYSIS DIPSTICK
Bilirubin, UA: NEGATIVE
Glucose, UA: NEGATIVE
KETONES UA: NEGATIVE
Leukocytes, UA: NEGATIVE
Nitrite, UA: NEGATIVE
PROTEIN UA: NEGATIVE
RBC UA: NEGATIVE
SPEC GRAV UA: 1.015
UROBILINOGEN UA: 0.2
pH, UA: 6

## 2013-06-25 NOTE — Progress Notes (Signed)
   Subjective:    Patient ID: Teresa Booker, female    DOB: August 01, 1945, 68 y.o.   MRN: [id number]  HPI Patient is seen in transferring care. She has past history of hyperlipidemia, seasonal allergies, IBS, right bundle branch block and borderline elevated blood pressure. She's got lab work yearly through her employer and wishes to continue doing that. Takes low-dose Crestor 5 mg daily for her hyperlipidemia. No history of CAD.  She is complaining today of some mild burning with urination off and on for several weeks. No fever. No chills. No nausea or vomiting. No urinary frequency. Appetite and weight stable.  Past Medical History  Diagnosis Date  . RBBB (right bundle branch block)   . Hyperlipemia   . Postnasal drip   . IBS (irritable bowel syndrome)   . Rotator cuff syndrome     right  . Allergic reaction   . Alcohol abuse     - abstemious 20 years. Attends AA - strong recovery    Past Surgical History  Procedure Laterality Date  . Mouth surgery  2002  . Foot surgery    . Ankle surgery  1986  . Rotator cuff repair  10-24-05    had bone spur and laceration off cuff. [place]    reports that she has quit smoking. She has never used smokeless tobacco. She reports that she does not drink alcohol or use illicit drugs. family history includes Aneurysm in her mother and another family member; Cancer in her brother; Coronary artery disease in her mother; Diabetes in her brother; Heart attack in her mother; Hyperlipidemia in her mother; Irritable bowel syndrome in her mother; Lung cancer in an other family member; Stroke in an other family member. Allergies  Allergen Reactions  . Aspirin   . Codeine   . Penicillins   . Sulfonamide Derivatives     Has a rash with the use of silvadiene for burn      Review of Systems  Constitutional: Negative for fatigue.  Eyes: Negative for visual disturbance.  Respiratory: Negative for cough, chest tightness, shortness of breath and wheezing.     Cardiovascular: Negative for chest pain, palpitations and leg swelling.  Endocrine: Negative for polydipsia and polyuria.  Neurological: Negative for dizziness, seizures, syncope, weakness, light-headedness and headaches.       Objective:   Physical Exam  Constitutional: She appears well-developed and well-nourished.  Neck: Neck supple. No thyromegaly present.  Cardiovascular: Normal rate and regular rhythm.  Exam reveals no gallop.   Pulmonary/Chest: Effort normal and breath sounds normal. No respiratory distress. She has no wheezes. She has no rales.  Musculoskeletal: She exhibits no edema.          Assessment & Plan:  #1 history of borderline elevated blood pressure. Currently stable. Continue monitoring #2 history of IBS. Symptomatically stable #3 dysuria. Check urinalysis to rule out UTI #4 hyperlipidemia. She takes low-dose Crestor. She has upcoming labs through her work

## 2013-06-25 NOTE — Progress Notes (Signed)
Pre visit review using our clinic review tool, if applicable. No additional management support is needed unless otherwise documented below in the visit note. 

## 2013-07-07 ENCOUNTER — Ambulatory Visit (INDEPENDENT_AMBULATORY_CARE_PROVIDER_SITE_OTHER): Payer: 59 | Admitting: Cardiovascular Disease

## 2013-07-07 VITALS — BP 158/60 | HR 72 | Ht 66.0 in | Wt 153.8 lb

## 2013-07-07 DIAGNOSIS — R030 Elevated blood-pressure reading, without diagnosis of hypertension: Secondary | ICD-10-CM

## 2013-07-07 DIAGNOSIS — E785 Hyperlipidemia, unspecified: Secondary | ICD-10-CM

## 2013-07-07 DIAGNOSIS — I4510 Unspecified right bundle-branch block: Secondary | ICD-10-CM

## 2013-07-07 DIAGNOSIS — IMO0001 Reserved for inherently not codable concepts without codable children: Secondary | ICD-10-CM

## 2013-07-07 NOTE — Assessment & Plan Note (Signed)
Cholesterol is at goal.  Continue current dose of statin and diet Rx.  No myalgias or side effects.  F/U  LFT's in 6 months. Lab Results  Component Value Date   LDLCALC 71 06/07/2011

## 2013-07-07 NOTE — Progress Notes (Signed)
Patient ID: Teresa Booker, female   DOB: Jul 08, 1945, 68 y.o.   MRN: [id number] Teresa Booker is seen today for F/U of cholesterol and abnormal ECG. She goes by "[name]" Her LDL is great at 76 with normal LFTs and no side effects from low dose crestor. Reviewed labs form 06/07/11 She took early retirement from [employer] and her stress level is much lower. ECG has shown RSR' with no changes and is probably normal for her. No SSCP, palpitations, dyspnea. Previous palpable aorta but [place] with no AAA Back on weight watchers and doing well.  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  Some minor anxiety attacks BP seems to be running high Not exercising but not adding salt to diet No excess ETOH or stimulants  She is hesitant to start BP meds due to previous drug intolerances  1/2 of her home readings are fine and 1/2 are high Had some dental procedures and mouth still sore    ROS: Denies fever, malais, weight loss, blurry vision, decreased visual acuity, cough, sputum, SOB, hemoptysis, pleuritic pain, palpitaitons, heartburn, abdominal pain, melena, lower extremity edema, claudication, or rash.  All other systems reviewed and negative  General: Affect appropriate Healthy:  appears stated age 68: normal Neck supple with no adenopathy JVP normal no bruits no thyromegaly Lungs clear with no wheezing and good diaphragmatic motion Heart:  S1/S2 no murmur, no rub, gallop or click PMI normal Abdomen: benighn, BS positve, no tenderness, no AAA no bruit.  No HSM or HJR Distal pulses intact with no bruits No edema Neuro non-focal Skin warm and dry No muscular weakness   Current Outpatient Prescriptions  Medication Sig Dispense Refill  . aspirin 81 MG tablet Take 81 mg by mouth daily.      . benzonatate (TESSALON) 100 MG capsule       . Calcium Carbonate-Vitamin D  (CALTRATE 600+D) 600-400 MG-UNIT per tablet Take 1 tablet by mouth daily.        . Diphenhydramine-APAP, sleep, (ACETAMINOPHEN PM PO) Take by mouth.        . Echinacea 125 MG CAPS Take by mouth.        . fluticasone (FLONASE) 50 MCG/ACT nasal spray Place 2 sprays into the nose daily.  48 g  3  . hydrOXYzine (ATARAX/VISTARIL) 25 MG tablet Take 1 tablet (25 mg total) by mouth daily.  90 tablet  3  . hyoscyamine (LEVBID) 0.375 MG 12 hr tablet Take 1 tablet by mouth  twice a day  180 tablet  3  . loratadine (CLARITIN) 10 MG tablet Take 1 tablet (10 mg total) by mouth daily.  90 tablet  3  . Lysine HCl 500 MG TABS 1 tab po qd       . methocarbamol (ROBAXIN) 500 MG tablet Take 1 tablet by mouth  every 6 hours as needed for muscle spasm  270 tablet  0  . Multiple Vitamin (MULTIVITAMIN) capsule Take 1 capsule by mouth daily.        Marland Kitchen omeprazole (PRILOSEC) 20 MG capsule Take 20 mg by mouth daily.        . predniSONE (DELTASONE) 10 MG tablet Take 10 mg by mouth daily with breakfast. Taper as follows: 6-5-4-3-3-2-2-1 for severe allergic reaction      . rosuvastatin (CRESTOR) 5 MG tablet Take 1 tablet (5  mg total) by mouth daily.  90 tablet  3   No current facility-administered medications for this visit.    Allergies  Aspirin; Codeine; Penicillins; and Sulfonamide derivatives  Electrocardiogram:  11/18/12  SR RSR'  Otherwise normal no LVH  Assessment and Plan

## 2013-07-07 NOTE — Assessment & Plan Note (Signed)
Yearly ECG no high grade heart block

## 2013-07-07 NOTE — Patient Instructions (Signed)
Your physician wants you to follow-up in:   NEXT  AVAILABLE WITH  DR Blima Singer will receive a reminder letter in the mail two months in advance. If you don't receive a letter, please call our office to schedule the follow-up appointment. Your physician recommends that you continue on your current medications as directed. Please refer to the Current Medication list given to you today.

## 2013-07-07 NOTE — Assessment & Plan Note (Signed)
She will take BP more frequently at home with he partner Carol's help  F/U next available

## 2013-07-09 ENCOUNTER — Other Ambulatory Visit: Payer: Self-pay

## 2013-07-09 MED ORDER — HYDROXYZINE HCL 25 MG PO TABS: 25.0000 mg | ORAL_TABLET | Freq: Every day | ORAL | Status: DC

## 2013-08-17 ENCOUNTER — Other Ambulatory Visit: Payer: Self-pay

## 2013-08-17 DIAGNOSIS — Z1231 Encounter for screening mammogram for malignant neoplasm of breast: Secondary | ICD-10-CM

## 2013-08-23 ENCOUNTER — Encounter: Payer: Self-pay | Admitting: Cardiovascular Disease

## 2013-09-02 ENCOUNTER — Ambulatory Visit
Admission: RE | Admit: 2013-09-02 | Discharge: 2013-09-02 | Disposition: A | Discharge status: Home / Self Care | Payer: 59 | Source: Ambulatory Visit

## 2013-09-02 DIAGNOSIS — Z1231 Encounter for screening mammogram for malignant neoplasm of breast: Secondary | ICD-10-CM

## 2013-09-07 ENCOUNTER — Encounter: Payer: Self-pay | Admitting: Cardiovascular Disease

## 2013-09-28 ENCOUNTER — Ambulatory Visit: Payer: 59 | Admitting: Cardiovascular Disease

## 2013-09-30 ENCOUNTER — Ambulatory Visit: Payer: 59 | Admitting: Cardiovascular Disease

## 2013-11-04 ENCOUNTER — Other Ambulatory Visit: Payer: Self-pay | Admitting: *Deleted

## 2013-11-04 ENCOUNTER — Ambulatory Visit (INDEPENDENT_AMBULATORY_CARE_PROVIDER_SITE_OTHER): Payer: 59 | Admitting: Cardiovascular Disease

## 2013-11-04 VITALS — BP 144/72 | HR 78 | Ht 66.0 in | Wt 151.0 lb

## 2013-11-04 DIAGNOSIS — I4510 Unspecified right bundle-branch block: Secondary | ICD-10-CM

## 2013-11-04 DIAGNOSIS — E785 Hyperlipidemia, unspecified: Secondary | ICD-10-CM

## 2013-11-04 DIAGNOSIS — IMO0001 Reserved for inherently not codable concepts without codable children: Secondary | ICD-10-CM

## 2013-11-04 DIAGNOSIS — R030 Elevated blood-pressure reading, without diagnosis of hypertension: Secondary | ICD-10-CM

## 2013-11-04 DIAGNOSIS — R0989 Other specified symptoms and signs involving the circulatory and respiratory systems: Secondary | ICD-10-CM

## 2013-11-04 NOTE — Assessment & Plan Note (Signed)
Not heard today Previous US no AAA or SMA disease

## 2013-11-04 NOTE — Assessment & Plan Note (Signed)
Continue lifestyle modifications low sodium diet  Home readings normal and no need for medication

## 2013-11-04 NOTE — Progress Notes (Signed)
Patient ID: Teresa Booker, female   DOB: [birth year], 68 y.o.   MRN: [id number] Teresa Booker is seen today for F/U of cholesterol and abnormal ECG. She goes by "Teresa Booker" Her LDL is great at 74 with normal LFTs and no side effects from low dose crestor. Reviewed labs form this year She took early retirement from [facility] and her stress level is much lower. Working at [facility]  ECG has shown Teresa Booker' with no changes and is probably normal for her. No SSCP, palpitations, dyspnea. Previous palpable aorta but [place] with no AAA Back on weight watchers and doing well.   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  All other systems reviewed and negative  General: Affect appropriate Healthy:  appears stated age 39: normal Neck supple with no adenopathy JVP normal no bruits no thyromegaly Lungs clear with no wheezing and good diaphragmatic motion Heart:  S1/S2 no murmur, no rub, gallop or click PMI normal Abdomen: benighn, BS positve, no tenderness, no AAA no bruit.  No HSM or HJR Distal pulses intact with no bruits No edema Neuro non-focal Skin warm and dry No muscular weakness   Current Outpatient Prescriptions  Medication Sig Dispense Refill  . aspirin 81 MG tablet Take 81 mg by mouth daily.      . Calcium Carbonate-Vitamin D (CALTRATE 600+D) 600-400 MG-UNIT per tablet Take 1 tablet by mouth daily.        . Diphenhydramine-APAP, sleep, (ACETAMINOPHEN PM PO) Take by mouth.        . Echinacea 125 MG CAPS Take by mouth.        . fluticasone (FLONASE) 50 MCG/ACT nasal spray Place 2 sprays into Teresa nose daily.  48 g  3  . hyoscyamine (LEVBID) 0.375 MG 12 hr tablet Take 1 tablet by mouth  twice a day  180 tablet  3  .  loratadine (CLARITIN) 10 MG tablet Take 1 tablet (10 mg total) by mouth daily.  90 tablet  3  . Lysine HCl 500 MG TABS 1 tab po qd       . methocarbamol (ROBAXIN) 500 MG tablet Take 1 tablet by mouth  every 6 hours as needed for muscle spasm  270 tablet  0  . Multiple Vitamin (MULTIVITAMIN) capsule Take 1 capsule by mouth daily.        Marland Kitchen omeprazole (PRILOSEC) 20 MG capsule Take 20 mg by mouth daily.        . predniSONE (DELTASONE) 10 MG tablet Take 10 mg by mouth daily with breakfast. Taper as follows: 6-5-4-3-3-2-2-1 for severe allergic reaction      . rosuvastatin (CRESTOR) 5 MG tablet Take 1 tablet (5 mg total) by mouth daily.  90 tablet  3  . benzonatate (TESSALON) 100 MG capsule       . HYDROcodone-acetaminophen (NORCO/VICODIN) 5-325 MG per tablet       . hydrOXYzine (ATARAX/VISTARIL) 25 MG tablet Take 1 tablet (25 mg total) by mouth daily.  90 tablet  3   No current facility-administered medications for this visit.    Allergies  Aspirin; Codeine; Penicillins; and Sulfonamide derivatives  Electrocardiogram:`  NSR rate 78  Normal   Assessment and Plan

## 2013-11-04 NOTE — Assessment & Plan Note (Signed)
Cholesterol is at goal.  Continue current dose of statin and diet Rx.  No myalgias or side effects.  F/U  LFT's in 6 months. Lab Results  Component Value Date   LDLCALC 71 06/07/2011  SEE HPI recent LDL

## 2013-11-04 NOTE — Assessment & Plan Note (Signed)
RSR' resolved Normal ECG today Yearly f/u

## 2013-11-04 NOTE — Patient Instructions (Signed)
Your physician wants you to follow-up in:  6 MONTHS WITH DR NISHAN  You will receive a reminder letter in the mail two months in advance. If you don't receive a letter, please call our office to schedule the follow-up appointment. Your physician recommends that you continue on your current medications as directed. Please refer to the Current Medication list given to you today. 

## 2013-12-27 DIAGNOSIS — I4510 Unspecified right bundle-branch block: Secondary | ICD-10-CM

## 2014-01-18 ENCOUNTER — Telehealth: Payer: Self-pay | Admitting: Internal Medicine

## 2014-01-18 MED ORDER — HYOSCYAMINE SULFATE ER 0.375 MG PO TB12: ORAL_TABLET | ORAL | Status: DC

## 2014-01-18 NOTE — Telephone Encounter (Signed)
Pt is waiting on mailorder and needs new rx hyoscyamine tab 0.375 er #60 only sent to [facility] [address]

## 2014-01-18 NOTE — Telephone Encounter (Signed)
Rx sent to pharmacy   

## 2014-03-28 ENCOUNTER — Telehealth: Payer: Self-pay

## 2014-03-28 MED ORDER — METHOCARBAMOL 500 MG PO TABS: ORAL_TABLET | ORAL | Status: DC

## 2014-03-28 MED ORDER — ROSUVASTATIN CALCIUM 5 MG PO TABS: 5.0000 mg | ORAL_TABLET | Freq: Every day | ORAL | Status: DC

## 2014-03-28 MED ORDER — HYOSCYAMINE SULFATE ER 0.375 MG PO TB12: ORAL_TABLET | ORAL | Status: DC

## 2014-03-28 NOTE — Telephone Encounter (Signed)
Rx's sent to pharmacy.  

## 2014-03-28 NOTE — Telephone Encounter (Signed)
[facility] refill request for METHOCARBAMOL TAB, CRESTOR TAB, and HYOSCYAMINE TAB

## 2014-06-01 ENCOUNTER — Ambulatory Visit (INDEPENDENT_AMBULATORY_CARE_PROVIDER_SITE_OTHER): Payer: 59 | Admitting: Cardiovascular Disease

## 2014-06-01 ENCOUNTER — Encounter: Payer: Self-pay | Admitting: Cardiovascular Disease

## 2014-06-01 VITALS — BP 140/80 | HR 80 | Ht 66.0 in | Wt 156.8 lb

## 2014-06-01 DIAGNOSIS — E785 Hyperlipidemia, unspecified: Secondary | ICD-10-CM

## 2014-06-01 DIAGNOSIS — R030 Elevated blood-pressure reading, without diagnosis of hypertension: Secondary | ICD-10-CM

## 2014-06-01 DIAGNOSIS — I4510 Unspecified right bundle-branch block: Secondary | ICD-10-CM | POA: Diagnosis not present

## 2014-06-01 DIAGNOSIS — IMO0001 Reserved for inherently not codable concepts without codable children: Secondary | ICD-10-CM

## 2014-06-01 MED ORDER — ROSUVASTATIN CALCIUM 5 MG PO TABS: 5.0000 mg | ORAL_TABLET | Freq: Every day | ORAL | Status: DC

## 2014-06-01 NOTE — Patient Instructions (Signed)
Medication Instructions:  Your physician recommends that you continue on your current medications as directed. Please refer to the Current Medication list given to you today.   Labwork: NONE  Testing/Procedures: NONE  Follow-Up: Your physician wants you to follow-up in: 6 MONTHS  WITH  DR NISHAN  You will receive a reminder letter in the mail two months in advance. If you don't receive a letter, please call our office to schedule the follow-up appointment.  Any Other Special Instructions Will Be Listed Below (If Applicable).   

## 2014-06-01 NOTE — Progress Notes (Signed)
Patient ID: Teresa Booker, female   DOB: October 30, 1945, 69 y.o.   MRN: [id number] Teresa Booker is seen today for F/U of cholesterol and abnormal ECG. She goes by "[name]" Her LDL is great at 74 with normal LFTs and no side effects from low dose crestor. Reviewed labs form this year She took early retirement from [employer] and her stress level is much lower. Working at [employer]  ECG has shown [employer]' with no changes and is probably normal for her. No SSCP, palpitations, dyspnea. Previous palpable aorta but [place] with no AAA Back on weight watchers and doing well.   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  All other systems reviewed and negative  General: Affect appropriate Healthy:  appears stated age 41: normal Neck supple with no adenopathy JVP normal no bruits no thyromegaly Lungs clear with no wheezing and good diaphragmatic motion Heart:  S1/S2 no murmur, no rub, gallop or click PMI normal Abdomen: benighn, BS positve, no tenderness, no AAA no bruit.  No HSM or HJR Distal pulses intact with no bruits No edema Neuro non-focal Skin warm and dry No muscular weakness   Current Outpatient Prescriptions  Medication Sig Dispense Refill  . aspirin 81 MG tablet Take 81 mg by mouth daily.    . Calcium Carbonate-Vitamin D (CALTRATE 600+D) 600-400 MG-UNIT per tablet Take 1 tablet by mouth daily.      . Diphenhydramine-APAP, sleep, (ACETAMINOPHEN PM PO) Take by mouth.      . Echinacea 125 MG CAPS Take by mouth.      . fluticasone (FLONASE) 50 MCG/ACT nasal spray Place 2 sprays into the nose daily. 48 g 3  . hydrOXYzine (ATARAX/VISTARIL) 25 MG tablet Take 1 tablet (25 mg total) by mouth daily. 90 tablet 3  .  hyoscyamine (LEVBID) 0.375 MG 12 hr tablet Take 1 tablet by mouth  twice a day 180 tablet 0  . loratadine (CLARITIN) 10 MG tablet Take 1 tablet (10 mg total) by mouth daily. 90 tablet 3  . Lysine HCl 500 MG TABS 1 tab po qd     . methocarbamol (ROBAXIN) 500 MG tablet Take 1 tablet by mouth  every 6 hours as needed for muscle spasm 270 tablet 0  . Multiple Vitamin (MULTIVITAMIN) capsule Take 1 capsule by mouth daily.      Marland Kitchen omeprazole (PRILOSEC) 20 MG capsule Take 20 mg by mouth daily.      . predniSONE (DELTASONE) 10 MG tablet Take 10 mg by mouth daily with breakfast. Taper as follows: 6-5-4-3-3-2-2-1 for severe allergic reaction    . rosuvastatin (CRESTOR) 5 MG tablet Take 1 tablet (5 mg total) by mouth daily. 90 tablet 0   No current facility-administered medications for this visit.    Allergies  Aspirin; Codeine; Penicillins; and Sulfonamide derivatives  Electrocardiogram:`  11/04/13  NSR rate 78  Normal   Assessment and Plan

## 2014-06-01 NOTE — Assessment & Plan Note (Signed)
Stable some white coat component Better at home Stress will be better at end of year thinking of retiring  Will start going to [employer] grieving over nephews sons suicide  F/U 6 months Continue current meds

## 2014-06-01 NOTE — Assessment & Plan Note (Signed)
Cholesterol is at goal.  Continue current dose of statin and diet Rx.  No myalgias or side effects.  F/U  LFT's in 6 months. Lab Results  Component Value Date   [place] 71 06/07/2011  Labs with primary reports LDL under 100 in January

## 2014-06-01 NOTE — Assessment & Plan Note (Signed)
RSR'  No changes no structural heart disease yearly ECG

## 2014-07-20 ENCOUNTER — Other Ambulatory Visit: Payer: Self-pay | Admitting: Family Medicine

## 2014-07-21 ENCOUNTER — Other Ambulatory Visit: Payer: Self-pay

## 2014-07-21 MED ORDER — ROSUVASTATIN CALCIUM 5 MG PO TABS: 5.0000 mg | ORAL_TABLET | Freq: Every day | ORAL | Status: DC

## 2014-08-21 ENCOUNTER — Other Ambulatory Visit: Payer: Self-pay | Admitting: Family Medicine

## 2014-08-31 ENCOUNTER — Encounter: Payer: Self-pay | Admitting: Family Medicine

## 2014-08-31 ENCOUNTER — Ambulatory Visit (INDEPENDENT_AMBULATORY_CARE_PROVIDER_SITE_OTHER): Payer: 59 | Admitting: Family Medicine

## 2014-08-31 VITALS — BP 136/74 | HR 82 | Temp 98.3°F | Wt 153.0 lb

## 2014-08-31 DIAGNOSIS — K589 Irritable bowel syndrome without diarrhea: Secondary | ICD-10-CM

## 2014-08-31 DIAGNOSIS — R1011 Right upper quadrant pain: Secondary | ICD-10-CM

## 2014-08-31 DIAGNOSIS — Z1211 Encounter for screening for malignant neoplasm of colon: Secondary | ICD-10-CM

## 2014-08-31 DIAGNOSIS — E785 Hyperlipidemia, unspecified: Secondary | ICD-10-CM

## 2014-08-31 NOTE — Progress Notes (Signed)
Pre visit review using our clinic review tool, if applicable. No additional management support is needed unless otherwise documented below in the visit note. 

## 2014-08-31 NOTE — Progress Notes (Signed)
Subjective:    Patient ID: Teresa Booker, female    DOB: Jun 14, 1945, 69 y.o.   MRN: [id number]  HPI Patient here to discuss the following issues  Intermittent right upper quadrant pain. She notices after eating especially heavy foods or fatty foods. Pain occasionally radiates to the back. Symptoms are consistently right upper quadrant. She's not had any nausea or vomiting. No recent appetite or weight changes. She had ultrasound but this was now 4 years ago which revealed no gallbladder abnormalities.  Recent UTI which is treated are gynecologist with reported follow-up urinalysis normal. She has occasional mild burning this time but no consistent symptoms  She had colonoscopy about 12 years ago. She is willing to be scheduled for repeat at this time. No recent stool changes.  Mild hyperlipidemia treated with low-dose Crestor 5 mg daily. No history of CAD or peripheral vascular disease. She plans to get lab work through [facility] in about 3 weeks.  Long history of IBS generally controlled with low-dose Levbid. Requesting refills. No recent severe constipation or diarrhea issues.  Past Medical History  Diagnosis Date  . RBBB (right bundle branch block)   . Hyperlipemia   . Postnasal drip   . IBS (irritable bowel syndrome)   . Rotator cuff syndrome     right  . Allergic reaction   . Alcohol abuse     - abstemious 20 years. Attends AA - strong recovery    Past Surgical History  Procedure Laterality Date  . Mouth surgery  2002  . Foot surgery    . Ankle surgery  1986  . Rotator cuff repair  10-24-05    had bone spur and laceration off cuff. Daldorf    reports that she has quit smoking. She has never used smokeless tobacco. She reports that she does not drink alcohol or use illicit drugs. family history includes Aneurysm in her mother and another family member; Cancer in her brother; Coronary artery disease in her mother; Diabetes in her brother; Heart attack in her mother;  Hyperlipidemia in her mother; Irritable bowel syndrome in her mother; Lung cancer in an other family member; Stroke in an other family member. Allergies  Allergen Reactions  . Aspirin   . Codeine   . Penicillins   . Sulfonamide Derivatives     Has a rash with the use of silvadiene for burn      Review of Systems  Constitutional: Negative for fever, chills, appetite change and unexpected weight change.  HENT: Positive for congestion. Negative for trouble swallowing.   Respiratory: Negative for cough and shortness of breath.   Cardiovascular: Negative for chest pain.  Gastrointestinal: Positive for abdominal pain. Negative for nausea, vomiting, diarrhea and constipation.       Objective:   Physical Exam  Constitutional: She appears well-developed and well-nourished. No distress.  Neck: Neck supple. No thyromegaly present.  Cardiovascular: Normal rate and regular rhythm.   Pulmonary/Chest: Effort normal and breath sounds normal. No respiratory distress. She has no wheezes. She has no rales.  Abdominal: Soft. Bowel sounds are normal. She exhibits no distension and no mass. There is tenderness. There is no rebound and no guarding.  Mild tenderness to deep palpation right upper quadrant. No guarding or rebound. No hepatomegaly.  Musculoskeletal: She exhibits no edema.          Assessment & Plan:  #1 intermittent and recurrent right upper quadrant pain. Obtain repeat ultrasound. She has scheduled lab work in couple weeks. Rule out  symptomatic gallstones. #2 health maintenance. Patient approximate 12 years out from prior colonoscopy. She is willing to get rescreened at this point. We discussed Prevnar but apparently she had fairly severe allergic reaction with Pneumovax with local rash #3 mild hyperlipidemia. Continue Crestor. Scheduled labs in a couple weeks #4 history of IBS. Stable. Refill Levbid

## 2014-09-09 ENCOUNTER — Ambulatory Visit
Admission: RE | Admit: 2014-09-09 | Discharge: 2014-09-09 | Disposition: A | Discharge status: Home / Self Care | Payer: 59 | Source: Ambulatory Visit | Attending: Family Medicine | Admitting: Family Medicine

## 2014-09-09 DIAGNOSIS — R1011 Right upper quadrant pain: Secondary | ICD-10-CM

## 2014-09-14 ENCOUNTER — Encounter: Payer: Self-pay | Admitting: Gastroenterology

## 2014-11-09 ENCOUNTER — Other Ambulatory Visit: Payer: Self-pay | Admitting: Cardiovascular Disease

## 2014-11-09 ENCOUNTER — Other Ambulatory Visit: Payer: Self-pay | Admitting: Family Medicine

## 2014-11-14 ENCOUNTER — Ambulatory Visit (AMBULATORY_SURGERY_CENTER): Payer: Self-pay | Admitting: *Deleted

## 2014-11-14 VITALS — Ht 66.0 in | Wt 153.0 lb

## 2014-11-14 DIAGNOSIS — Z1211 Encounter for screening for malignant neoplasm of colon: Secondary | ICD-10-CM

## 2014-11-14 MED ORDER — NA SULFATE-K SULFATE-MG SULF 17.5-3.13-1.6 GM/177ML PO SOLN: 1.0000 | Freq: Once | ORAL | Status: DC

## 2014-11-14 NOTE — Progress Notes (Signed)
No egg or soy allergy. No anesthesia problems.  No home o2.  No diet meds.

## 2014-11-28 ENCOUNTER — Encounter: Payer: 59 | Admitting: Gastroenterology

## 2014-12-29 ENCOUNTER — Encounter: Payer: Self-pay | Admitting: Gastroenterology

## 2015-01-10 ENCOUNTER — Ambulatory Visit (AMBULATORY_SURGERY_CENTER): Payer: 59 | Admitting: Gastroenterology

## 2015-01-10 ENCOUNTER — Encounter: Payer: Self-pay | Admitting: Gastroenterology

## 2015-01-10 VITALS — BP 137/78 | HR 77 | Temp 97.0°F | Resp 31 | Ht 66.0 in | Wt 153.0 lb

## 2015-01-10 DIAGNOSIS — K629 Disease of anus and rectum, unspecified: Secondary | ICD-10-CM | POA: Diagnosis not present

## 2015-01-10 DIAGNOSIS — K6289 Other specified diseases of anus and rectum: Secondary | ICD-10-CM

## 2015-01-10 DIAGNOSIS — K621 Rectal polyp: Secondary | ICD-10-CM

## 2015-01-10 DIAGNOSIS — D128 Benign neoplasm of rectum: Secondary | ICD-10-CM

## 2015-01-10 DIAGNOSIS — Z1211 Encounter for screening for malignant neoplasm of colon: Secondary | ICD-10-CM

## 2015-01-10 DIAGNOSIS — D125 Benign neoplasm of sigmoid colon: Secondary | ICD-10-CM

## 2015-01-10 DIAGNOSIS — D129 Benign neoplasm of anus and anal canal: Secondary | ICD-10-CM

## 2015-01-10 MED ORDER — SODIUM CHLORIDE 0.9 % IV SOLN: 500.0000 mL | INTRAVENOUS | Status: DC

## 2015-01-10 NOTE — Progress Notes (Signed)
Stable to RR 

## 2015-01-10 NOTE — Op Note (Signed)
[facility]  [address]. [place], [address]   COLONOSCOPY PROCEDURE REPORT  PATIENT: Teresa Booker, Teresa Booker  MR#: [id number] BIRTHDATE: 1945-05-19 , 32  yrs. old GENDER: female ENDOSCOPIST: Harl Bowie, MD REFERRED KD:XIPJA Elease Hashimoto, M.D. PROCEDURE DATE:  01/10/2015 PROCEDURE:   Colonoscopy with cold biopsy polypectomy, Colonoscopy with snare polypectomy, and Colonoscopy, screening First Screening Colonoscopy - Avg.  risk and is 50 yrs.  old or older - No.  Prior Negative Screening - Now for repeat screening. 10 or more years since last screening  History of Adenoma - Now for follow-up colonoscopy & has been > or = to 3 yrs.  N/A  Polyps removed today? Yes ASA CLASS:   Class II INDICATIONS:Screening for colonic neoplasia and Colorectal Neoplasm Risk Assessment for this procedure is average risk. MEDICATIONS: Propofol 350 mg IV  DESCRIPTION OF PROCEDURE:   After the risks benefits and alternatives of the procedure were thoroughly explained, informed consent was obtained.  The digital rectal exam revealed no abnormalities of the rectum and revealed a skin tag.   The LB [id number]  endoscope was introduced through the anus and advanced to the terminal ileum which was intubated for a short distance. No adverse events experienced.   The quality of the prep was good.  The instrument was then slowly withdrawn as the colon was fully examined. Estimated blood loss is zero unless otherwise noted in this procedure report.  COLON FINDINGS: The examined terminal ileum appeared to be normal. Two sessile polyps ranging between 5-2m in size were found in the rectum and sigmoid colon.  A polypectomy was performed with a cold snare.  The resection was complete, the polyp tissue was completely retrieved and sent to histology.  Retroflexed views revealed internal hemorrhoids and hypertrophied anal papilla, biopsies were obtained. The time to cecum = 3.9 Withdrawal time =  11.9   The scope was withdrawn and the procedure completed. COMPLICATIONS: There were no immediate complications.  ENDOSCOPIC IMPRESSION: 1. Hypertrophied anal papilla, biopsies were obtained 2. Two sessile polyps ranging between 5-976min size were found in the rectum and sigmoid colon; polypectomy was performed with a cold snare  RECOMMENDATIONS: If the polyp(s) removed today are proven to be adenomatous (pre-cancerous) polyps, you will need a repeat colonoscopy in 5 years.  Otherwise you should continue to follow colorectal cancer screening guidelines for "routine risk" patients with colonoscopy in 10 years.  You will receive a letter within 1-2 weeks with the results of your biopsy as well as final recommendations.  Please call my office if you have not received a letter after 3 weeks.  eSigned:  KaHarl BowieMD 01/10/2015 9:38 AM

## 2015-01-10 NOTE — Patient Instructions (Signed)
YOU HAD AN ENDOSCOPIC PROCEDURE TODAY AT [facility]:   Refer to the procedure report that was given to you for any specific questions about what was found during the examination.  If the procedure report does not answer your questions, please call your gastroenterologist to clarify.  If you requested that your care partner not be given the details of your procedure findings, then the procedure report has been included in a sealed envelope for you to review at your convenience later.  YOU SHOULD EXPECT: Some feelings of bloating in the abdomen. Passage of more gas than usual.  Walking can help get rid of the air that was put into your GI tract during the procedure and reduce the bloating. If you had a lower endoscopy (such as a colonoscopy or flexible sigmoidoscopy) you may notice spotting of blood in your stool or on the toilet paper. If you underwent a bowel prep for your procedure, you may not have a normal bowel movement for a few days.  Please Note:  You might notice some irritation and congestion in your nose or some drainage.  This is from the oxygen used during your procedure.  There is no need for concern and it should clear up in a day or so.  SYMPTOMS TO REPORT IMMEDIATELY:   Following lower endoscopy (colonoscopy or flexible sigmoidoscopy):  Excessive amounts of blood in the stool  Significant tenderness or worsening of abdominal pains  Swelling of the abdomen that is new, acute  Fever of 100F or higher   For urgent or emergent issues, a gastroenterologist can be reached at any hour by calling [phone].   DIET: Your first meal following the procedure should be a small meal and then it is ok to progress to your normal diet. Heavy or fried foods are harder to digest and may make you feel nauseous or bloated.  Likewise, meals heavy in dairy and vegetables can increase bloating.  Drink plenty of fluids but you should avoid alcoholic beverages for 24  hours.  ACTIVITY:  You should plan to take it easy for the rest of today and you should NOT DRIVE or use heavy machinery until tomorrow (because of the sedation medicines used during the test).    FOLLOW UP: Our staff will call the number listed on your records the next business day following your procedure to check on you and address any questions or concerns that you may have regarding the information given to you following your procedure. If we do not reach you, we will leave a message.  However, if you are feeling well and you are not experiencing any problems, there is no need to return our call.  We will assume that you have returned to your regular daily activities without incident.  If any biopsies were taken you will be contacted by phone or by letter within the next 1-3 weeks.  Please call us at [phone] if you have not heard about the biopsies in 3 weeks.    SIGNATURES/CONFIDENTIALITY: You and/or your care partner have signed paperwork which will be entered into your electronic medical record.  These signatures attest to the fact that that the information above on your After Visit Summary has been reviewed and is understood.  Full responsibility of the confidentiality of this discharge information lies with you and/or your care-partner.  Read all of the handouts given to you by your recovery room.

## 2015-01-10 NOTE — Progress Notes (Signed)
Called to room to assist during endoscopic procedure.  Patient ID and intended procedure confirmed with present staff. Received instructions for my participation in the procedure from the performing physician.  

## 2015-01-11 ENCOUNTER — Telehealth: Payer: Self-pay

## 2015-01-11 NOTE — Telephone Encounter (Signed)
  Follow up Call-  Call back number 01/10/2015  Post procedure Call Back phone  # [phone]  Permission to leave phone message Yes     Patient questions:  Do you have a fever, pain , or abdominal swelling? No. Pain Score  0 *  Have you tolerated food without any problems? Yes.    Have you been able to return to your normal activities? Yes.    Do you have any questions about your discharge instructions: Diet   No. Medications  No. Follow up visit  No.  Do you have questions or concerns about your Care? No.  Actions: * If pain score is 4 or above: No action needed, pain <4.  No problems per the pt. maw

## 2015-01-13 ENCOUNTER — Encounter: Payer: Self-pay | Admitting: Family Medicine

## 2015-01-18 ENCOUNTER — Other Ambulatory Visit: Payer: Self-pay | Admitting: Cardiovascular Disease

## 2015-01-24 ENCOUNTER — Encounter: Payer: Self-pay | Admitting: Gastroenterology

## 2015-03-10 DIAGNOSIS — M7071 Other bursitis of hip, right hip: Secondary | ICD-10-CM | POA: Diagnosis not present

## 2015-03-10 DIAGNOSIS — M65321 Trigger finger, right index finger: Secondary | ICD-10-CM | POA: Diagnosis not present

## 2015-03-31 ENCOUNTER — Other Ambulatory Visit: Payer: Self-pay | Admitting: Cardiovascular Disease

## 2015-04-13 ENCOUNTER — Other Ambulatory Visit: Payer: Self-pay | Admitting: *Deleted

## 2015-04-13 MED ORDER — HYOSCYAMINE SULFATE ER 0.375 MG PO TB12: ORAL_TABLET | ORAL | Status: DC

## 2015-05-04 ENCOUNTER — Ambulatory Visit (INDEPENDENT_AMBULATORY_CARE_PROVIDER_SITE_OTHER): Payer: Medicare Other | Admitting: Family Medicine

## 2015-05-04 VITALS — BP 130/80 | HR 105 | Temp 99.4°F | Ht 66.0 in | Wt 151.8 lb

## 2015-05-04 DIAGNOSIS — K589 Irritable bowel syndrome without diarrhea: Secondary | ICD-10-CM

## 2015-05-04 DIAGNOSIS — E785 Hyperlipidemia, unspecified: Secondary | ICD-10-CM | POA: Diagnosis not present

## 2015-05-04 DIAGNOSIS — J069 Acute upper respiratory infection, unspecified: Secondary | ICD-10-CM | POA: Diagnosis not present

## 2015-05-04 MED ORDER — ROSUVASTATIN CALCIUM 5 MG PO TABS: 5.0000 mg | ORAL_TABLET | Freq: Every day | ORAL | Status: DC

## 2015-05-04 MED ORDER — HYOSCYAMINE SULFATE ER 0.375 MG PO TB12: ORAL_TABLET | ORAL | Status: DC

## 2015-05-04 MED ORDER — HYDROXYZINE HCL 25 MG PO TABS: 25.0000 mg | ORAL_TABLET | Freq: Three times a day (TID) | ORAL | Status: DC | PRN

## 2015-05-04 NOTE — Progress Notes (Signed)
Subjective:    Patient ID: Teresa Booker, female    DOB: Oct 08, 1945, 70 y.o.   MRN: [id number]  HPI  Patient seen for the following issues   acute new issue of 3 day history of body aches,  Sore throat, nasal congestion, chills, and occasional cough. Temperature up to 100.3 on one occasion. Increased malaise. Myalgias. No headache at this time. She's taken some Tylenol with mild relief. Possible sick contact last week. No nausea or vomiting. No skin rashes.   Hyperlipidemia. Patient had lipids done last August. She remains on Crestor. Requesting refills. She takes low dosage of 5 mg. No side effects   history of IBS. She takes hyoscyamine generally twice daily. No recent abdominal pain or change in bowel habits. Questing refills.  Patient also takes Atarax one at night and has been on this apparently for years. Requesting refills.  Past Medical History  Diagnosis Date  . RBBB (right bundle branch block)   . Hyperlipemia   . Postnasal drip   . IBS (irritable bowel syndrome)   . Rotator cuff syndrome     right  . Allergic reaction   . Alcohol abuse     - abstemious 20 years. Attends AA - strong recovery   . Allergy     seasonal  . Anxiety   . GERD (gastroesophageal reflux disease)   . Fibromyalgia    Past Surgical History  Procedure Laterality Date  . Mouth surgery  2002  . Rotator cuff repair  10-24-05    had bone spur and laceration off cuff. Daldorf  . Colonoscopy      reports that she has quit smoking. She has never used smokeless tobacco. She reports that she does not drink alcohol or use illicit drugs. family history includes Aneurysm in her mother; Cancer in her brother; Coronary artery disease in her mother; Diabetes in her brother; Heart attack in her mother; Hyperlipidemia in her mother; Irritable bowel syndrome in her mother. There is no history of Colon cancer. Allergies  Allergen Reactions  . Aspirin   . Codeine   . Penicillins   . Sulfonamide Derivatives    Has a rash with the use of silvadiene for burn      Review of Systems  Constitutional: Positive for fever and chills. Negative for fatigue.  HENT: Positive for congestion and sore throat.   Eyes: Negative for visual disturbance.  Respiratory: Positive for cough. Negative for chest tightness, shortness of breath and wheezing.   Cardiovascular: Negative for chest pain, palpitations and leg swelling.  Endocrine: Negative for polydipsia and polyuria.  Genitourinary: Negative for dysuria.  Musculoskeletal: Positive for myalgias.  Neurological: Negative for dizziness, seizures, syncope, weakness, light-headedness and headaches.       Objective:   Physical Exam  Constitutional: She appears well-developed and well-nourished.  HENT:  Right Ear: External ear normal.  Left Ear: External ear normal.  Mouth/Throat: Oropharynx is clear and moist.  Neck: Neck supple.  Cardiovascular: Normal rate and regular rhythm.  Exam reveals no gallop.   Pulmonary/Chest: Effort normal and breath sounds normal. No respiratory distress. She has no wheezes. She has no rales.  Lymphadenopathy:    She has no cervical adenopathy.  Skin: No rash noted.          Assessment & Plan:   #1 viral URI. Non-focal exam. Recommend over-the-counter Advil or Aleve for body aches and fever. Stay well-hydrated. Touch base if fever not resolving next couple days   #2 IBS. Refill  hyoscyamine for one year. Symptomatically stable   #3 hyperlipidemia. Refill Crestor. She is encouraged to repeat lipids by this summer.

## 2015-05-04 NOTE — Patient Instructions (Signed)
Viral Infections °A viral infection can be caused by different types of viruses. Most viral infections are not serious and resolve on their own. However, some infections may cause severe symptoms and may lead to further complications. °SYMPTOMS °Viruses can frequently cause: °· Minor sore throat. °· Aches and pains. °· Headaches. °· Runny nose. °· Different types of rashes. °· Watery eyes. °· Tiredness. °· Cough. °· Loss of appetite. °· Gastrointestinal infections, resulting in nausea, vomiting, and diarrhea. °These symptoms do not respond to antibiotics because the infection is not caused by bacteria. However, you might catch a bacterial infection following the viral infection. This is sometimes called a "superinfection." Symptoms of such a bacterial infection may include: °· Worsening sore throat with pus and difficulty swallowing. °· Swollen neck glands. °· Chills and a high or persistent fever. °· Severe headache. °· Tenderness over the sinuses. °· Persistent overall ill feeling (malaise), muscle aches, and tiredness (fatigue). °· Persistent cough. °· Yellow, green, or brown mucus production with coughing. °HOME CARE INSTRUCTIONS  °· Only take over-the-counter or prescription medicines for pain, discomfort, diarrhea, or fever as directed by your caregiver. °· Drink enough water and fluids to keep your urine clear or pale yellow. Sports drinks can provide valuable electrolytes, sugars, and hydration. °· Get plenty of rest and maintain proper nutrition. Soups and broths with crackers or rice are fine. °SEEK IMMEDIATE MEDICAL CARE IF:  °· You have severe headaches, shortness of breath, chest pain, neck pain, or an unusual rash. °· You have uncontrolled vomiting, diarrhea, or you are unable to keep down fluids. °· You or your child has an oral temperature above 102° F (38.9° C), not controlled by medicine. °· Your baby is older than 3 months with a rectal temperature of 102° F (38.9° C) or higher. °· Your baby is 3  months old or younger with a rectal temperature of 100.4° F (38° C) or higher. °MAKE SURE YOU:  °· Understand these instructions. °· Will watch your condition. °· Will get help right away if you are not doing well or get worse. °  °This information is not intended to replace advice given to you by your health care provider. Make sure you discuss any questions you have with your health care provider. °  °Document Released: 11/07/2004 Document Revised: 04/22/2011 Document Reviewed: 07/06/2014 °Elsevier Interactive Patient Education ©2016 [employer] Inc[employer] ° °

## 2015-05-08 MED FILL — OSCIMIN SR 0.375 MG TABLET: 0.375 | 30 days supply | Qty: 60 | Fill #0

## 2015-05-08 MED FILL — ROSUVASTATIN CALCIUM 5 MG T: 5 | 90 days supply | Qty: 90 | Fill #0

## 2015-05-08 MED FILL — hydrOXYzine HCL 25 MG TABS: 25 | 30 days supply | Qty: 90 | Fill #0

## 2015-07-17 MED FILL — OSCIMIN SR 0.375 MG TABLET: 0.375 | 30 days supply | Qty: 60 | Fill #1

## 2015-09-11 ENCOUNTER — Telehealth: Payer: Self-pay | Admitting: Family Medicine

## 2015-09-11 NOTE — Telephone Encounter (Signed)
Pt needs written rx hyoscyasmine 0.375 mg #90 w/refill

## 2015-09-12 MED ORDER — HYOSCYAMINE SULFATE ER 0.375 MG PO TB12: ORAL_TABLET | ORAL | 1 refills | Status: DC

## 2015-09-12 NOTE — Telephone Encounter (Signed)
Medication sent in for patient. 

## 2015-09-13 ENCOUNTER — Other Ambulatory Visit: Payer: Self-pay

## 2015-09-13 DIAGNOSIS — Z1231 Encounter for screening mammogram for malignant neoplasm of breast: Secondary | ICD-10-CM | POA: Diagnosis not present

## 2015-09-13 DIAGNOSIS — Z124 Encounter for screening for malignant neoplasm of cervix: Secondary | ICD-10-CM | POA: Diagnosis not present

## 2015-09-13 MED ORDER — HYDROXYZINE HCL 25 MG PO TABS: 25.0000 mg | ORAL_TABLET | Freq: Three times a day (TID) | ORAL | 3 refills | Status: DC | PRN

## 2015-09-13 MED ORDER — HYOSCYAMINE SULFATE ER 0.375 MG PO TB12: ORAL_TABLET | ORAL | 1 refills | Status: DC

## 2015-10-01 NOTE — Progress Notes (Signed)
Patient ID: Teresa Booker, female   DOB: [birth year], 70 y.o.   MRN: [id number] [name] is seen today for F/U of cholesterol and abnormal ECG. She goes by "Teresa Booker" Her LDL is great at 74 with normal LFTs and no side effects from low dose crestor. Reviewed labs form this year She took early retirement from [employer] and her stress level is much lower. Working at [employer]  ECG has shown [employer]' with no changes and is probably normal for her. No SSCP, palpitations, dyspnea. Previous palpable aorta but [place] with no AAA Back on weight watchers and doing well.   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  All other systems reviewed and negative  General: Affect appropriate Healthy:  appears stated age 5: normal Neck supple with no adenopathy JVP normal no bruits no thyromegaly Lungs clear with no wheezing and good diaphragmatic motion Heart:  S1/S2 no murmur, no rub, gallop or click PMI normal Abdomen: benighn, BS positve, no tenderness, no AAA no bruit.  No HSM or HJR Distal pulses intact with no bruits No edema Neuro non-focal Skin warm and dry No muscular weakness   Current Outpatient Prescriptions  Medication Sig Dispense Refill  . aspirin 81 MG tablet Take 81 mg by mouth daily.    . Calcium Carbonate-Vitamin D (CALTRATE 600+D) 600-400 MG-UNIT per tablet Take 1 tablet by mouth daily.      . Chromium 1000 MCG TABS Take 1 tablet by mouth daily.    . diphenhydramine-acetaminophen (TYLENOL PM) 25-500 MG TABS tablet Take 1 tablet by mouth at bedtime as needed (for sleep).    . Echinacea 125 MG CAPS Take 1 capsule by mouth daily.     . fluticasone  (FLONASE) 50 MCG/ACT nasal spray Place 2 sprays into both nostrils daily as needed for allergies or rhinitis.    . hyoscyamine (LEVBID) 0.375 MG 12 hr tablet Take 1 tablet by mouth  twice a day 180 tablet 1  . loratadine (CLARITIN) 10 MG tablet Take 1 tablet (10 mg total) by mouth daily. 90 tablet 3  . methocarbamol (ROBAXIN) 500 MG tablet TAKE 1 TABLET BY MOUTH  EVERY 6 HOURS AS NEEDED FOR MUSCLE SPASM 270 tablet 1  . Multiple Vitamin (MULTIVITAMIN) capsule Take 1 capsule by mouth daily.      Marland Kitchen omeprazole (PRILOSEC) 20 MG capsule Take 20 mg by mouth daily.      . rosuvastatin (CRESTOR) 5 MG tablet Take 1 tablet (5 mg total) by mouth at bedtime. 90 tablet 3   No current facility-administered medications for this visit.     Allergies  Codeine; Penicillins; Sulfonamide derivatives; and Aspirin  Electrocardiogram:`  11/04/13  NSR rate 78  Normal   Assessment and Plan  Abnormal ECG  RSR' not clinically significant and stable   Chol: on crestor needs labs ordered   Lumbago:  Continue robaxin f/u primary   F/u with me in a year    Jenkins Rouge

## 2015-10-03 ENCOUNTER — Encounter: Payer: Self-pay | Admitting: Cardiovascular Disease

## 2015-10-04 ENCOUNTER — Encounter: Payer: Self-pay | Admitting: Cardiovascular Disease

## 2015-10-04 ENCOUNTER — Ambulatory Visit (INDEPENDENT_AMBULATORY_CARE_PROVIDER_SITE_OTHER): Payer: Medicare Other | Admitting: Cardiovascular Disease

## 2015-10-04 VITALS — BP 140/60 | HR 92 | Ht 66.5 in | Wt 140.2 lb

## 2015-10-04 DIAGNOSIS — Z79899 Other long term (current) drug therapy: Secondary | ICD-10-CM

## 2015-10-04 DIAGNOSIS — I4510 Unspecified right bundle-branch block: Secondary | ICD-10-CM | POA: Diagnosis not present

## 2015-10-04 NOTE — Patient Instructions (Addendum)
Medication Instructions:  Your physician recommends that you continue on your current medications as directed. Please refer to the Current Medication list given to you today.  Labwork: Your physician recommends that you have lab work- Fasting lipid and liver panel  Testing/Procedures: NONE  Follow-Up: Your physician wants you to follow-up in: 12 months with Dr. Johnsie Cancel. You will receive a reminder letter in the mail two months in advance. If you don't receive a letter, please call our office to schedule the follow-up appointment.   If you need a refill on your cardiac medications before your next appointment, please call your pharmacy.

## 2015-10-05 ENCOUNTER — Other Ambulatory Visit: Payer: Medicare Other | Admitting: *Deleted

## 2015-10-05 DIAGNOSIS — Z79899 Other long term (current) drug therapy: Secondary | ICD-10-CM | POA: Diagnosis not present

## 2015-10-05 LAB — HEPATIC FUNCTION PANEL
ALT: 19 U/L (ref 6–29)
AST: 21 U/L (ref 10–35)
Albumin: 4.4 g/dL (ref 3.6–5.1)
Alkaline Phosphatase: 55 U/L (ref 33–130)
BILIRUBIN DIRECT: 0.1 mg/dL (ref <=0.2)
BILIRUBIN TOTAL: 0.5 mg/dL (ref 0.2–1.2)
Indirect Bilirubin: 0.4 mg/dL (ref 0.2–1.2)
Total Protein: 6.9 g/dL (ref 6.1–8.1)

## 2015-10-05 LAB — LIPID PANEL
CHOL/HDL RATIO: 2.7 ratio (ref <=5.0)
CHOLESTEROL: 149 mg/dL (ref 125–200)
HDL: 56 mg/dL (ref >=46)
LDL Cholesterol: 65 mg/dL (ref <130)
TRIGLYCERIDES: 140 mg/dL (ref <150)
VLDL: 28 mg/dL (ref <30)

## 2015-10-09 ENCOUNTER — Telehealth: Payer: Self-pay

## 2015-10-09 DIAGNOSIS — Z79899 Other long term (current) drug therapy: Secondary | ICD-10-CM

## 2015-10-09 MED FILL — hydrOXYzine HCL 25 MG TABS: 25 | 30 days supply | Qty: 90 | Fill #1

## 2015-10-09 MED FILL — ROSUVASTATIN CALCIUM 5 MG T: 5 | 90 days supply | Qty: 90 | Fill #1

## 2015-10-09 NOTE — Telephone Encounter (Signed)
-----   Message from Josue Hector, MD sent at 10/08/2015  4:43 PM EDT ----- Cholesterol is at goal and LFT's are normal F/U labs in 6 months

## 2015-10-09 NOTE — Telephone Encounter (Signed)
Called patient with lab results. Per Dr. Johnsie Cancel, Cholesterol is at goal and LFT's are normal F/U labs in 6 months. Patient verbalized understanding and will come in on 04/09/16 for lab work.

## 2015-12-19 DIAGNOSIS — Z23 Encounter for immunization: Secondary | ICD-10-CM | POA: Diagnosis not present

## 2015-12-27 ENCOUNTER — Ambulatory Visit (INDEPENDENT_AMBULATORY_CARE_PROVIDER_SITE_OTHER): Payer: Medicare Other | Admitting: Family Medicine

## 2015-12-27 VITALS — BP 140/84 | HR 85 | Temp 97.5°F | Ht 66.5 in | Wt 141.2 lb

## 2015-12-27 DIAGNOSIS — J019 Acute sinusitis, unspecified: Secondary | ICD-10-CM | POA: Diagnosis not present

## 2015-12-27 MED ORDER — CEFUROXIME AXETIL 250 MG PO TABS: 250.0000 mg | ORAL_TABLET | Freq: Two times a day (BID) | ORAL | 0 refills | Status: DC

## 2015-12-27 NOTE — Progress Notes (Signed)
Subjective:     Patient ID: Teresa Booker, female   DOB: September 15, 1945, 70 y.o.   MRN: [id number]  HPI Acute visit for sinus congestive symptoms. Onset slightly over one week ago. She's had some intermittent headaches and bilateral frontal sinus pressure. Some postnasal drip. Cough productive of green sputum. No fever. Increased malaise. She's tried nasal saline irrigation, Flonase, and Mucinex without relief. She's had sinusitis in the past. Reported allergy to sulfa and penicillin. She states she has taken Ceftin without difficulty in the past. She had GI upset with Zithromax and Biaxin.  Past Medical History:  Diagnosis Date  . Alcohol abuse    - abstemious 20 years. Attends AA - strong recovery   . Allergic reaction   . Allergy    seasonal  . Anxiety   . Fibromyalgia   . GERD (gastroesophageal reflux disease)   . Hyperlipemia   . IBS (irritable bowel syndrome)   . Postnasal drip   . RBBB (right bundle branch block)   . Rotator cuff syndrome    right   Past Surgical History:  Procedure Laterality Date  . COLONOSCOPY    . MOUTH SURGERY  2002  . ROTATOR CUFF REPAIR  10-24-05   had bone spur and laceration off cuff. Daldorf    reports that she has quit smoking. She has never used smokeless tobacco. She reports that she does not drink alcohol or use drugs. family history includes Aneurysm in her mother; Cancer in her brother; Coronary artery disease in her mother; Diabetes in her brother; Heart attack in her mother; Hyperlipidemia in her mother; Irritable bowel syndrome in her mother. Allergies  Allergen Reactions  . Codeine     Makes heart race  . Penicillins     unknown  . Sulfonamide Derivatives     Has a rash with the use of silvadiene for burn  . Aspirin     Upset stomach, High dose only     Review of Systems  Constitutional: Positive for fatigue. Negative for chills and fever.  HENT: Positive for congestion and postnasal drip.   Respiratory: Positive for cough.    Neurological: Positive for headaches.       Objective:   Physical Exam  Constitutional: She appears well-developed and well-nourished.  HENT:  Right Ear: External ear normal.  Left Ear: External ear normal.  Mouth/Throat: Oropharynx is clear and moist.  Nasal mucosa erythematous with some white yellow tinged mucus bilaterally  Neck: Neck supple.  Cardiovascular: Normal rate and regular rhythm.   Pulmonary/Chest: Effort normal and breath sounds normal. No respiratory distress. She has no wheezes. She has no rales.  Lymphadenopathy:    She has no cervical adenopathy.       Assessment:     Probable acute sinusitis-frontal    Plan:     -Stay well-hydrated -Continue nasal saline irrigation -Ceftin 250 mg twice daily for 10 days. She has taken this without difficulty in the past.  [name] MD [facility]

## 2015-12-27 NOTE — Progress Notes (Signed)
Pre visit review using our clinic review tool, if applicable. No additional management support is needed unless otherwise documented below in the visit note. 

## 2015-12-27 NOTE — Patient Instructions (Signed)

## 2016-01-11 ENCOUNTER — Ambulatory Visit (INDEPENDENT_AMBULATORY_CARE_PROVIDER_SITE_OTHER): Payer: Medicare Other | Admitting: Adult Health

## 2016-01-11 ENCOUNTER — Encounter: Payer: Self-pay | Admitting: Adult Health

## 2016-01-11 VITALS — BP 118/62 | Temp 98.2°F | Ht 66.5 in | Wt 141.8 lb

## 2016-01-11 DIAGNOSIS — R05 Cough: Secondary | ICD-10-CM

## 2016-01-11 DIAGNOSIS — R059 Cough, unspecified: Secondary | ICD-10-CM

## 2016-01-11 DIAGNOSIS — J0111 Acute recurrent frontal sinusitis: Secondary | ICD-10-CM

## 2016-01-11 MED ORDER — PREDNISONE 10 MG PO TABS: 10.0000 mg | ORAL_TABLET | Freq: Every day | ORAL | 0 refills | Status: DC

## 2016-01-11 MED ORDER — DOXYCYCLINE HYCLATE 100 MG PO CAPS: 100.0000 mg | ORAL_CAPSULE | Freq: Two times a day (BID) | ORAL | 0 refills | Status: DC

## 2016-01-11 MED FILL — DOXYCYCLINE HYCLATE 100 MG: 100 | 7 days supply | Qty: 14 | Fill #0

## 2016-01-11 MED FILL — predniSONE 10 MG TABS: 10 | 5 days supply | Qty: 5 | Fill #0

## 2016-01-12 NOTE — Progress Notes (Signed)
Subjective:    Patient ID: Teresa Booker, female    DOB: 1945/02/22, 70 y.o.   MRN: [id number]  HPI  70 year old female who  has a past medical history of Alcohol abuse; Allergic reaction; Allergy; Anxiety; Fibromyalgia; GERD (gastroesophageal reflux disease); Hyperlipemia; IBS (irritable bowel syndrome); Postnasal drip; RBBB (right bundle branch block); and Rotator cuff syndrome. She is a patient of Dr. Carolann Littler who I am seeing today for the first time. She presents today for an acute complaint of bilateral frontal sinus pressure, some postnasal drip, headaches, sore throat, as well as the dry cough. She was seen by Dr. Elease Hashimoto approximately 15 days ago and prescribed a 10 day course of Ceftin. Does report that although her symptoms have improved she continues to suffer from all her symptoms. She is also been using and Netty pot, Flonase, and Mucinex with no improvement.  She denies any fevers, nausea, and vomiting, and diarrhea. She is not feeling acutely ill   Review of Systems  Constitutional: Negative.   HENT: Positive for postnasal drip, rhinorrhea, sinus pain, sinus pressure and sore throat.   Eyes: Negative.   Respiratory: Positive for cough. Negative for chest tightness and wheezing.   Cardiovascular: Negative.   Neurological: Negative.   All other systems reviewed and are negative.  Past Medical History:  Diagnosis Date  . Alcohol abuse    - abstemious 20 years. Attends AA - strong recovery   . Allergic reaction   . Allergy    seasonal  . Anxiety   . Fibromyalgia   . GERD (gastroesophageal reflux disease)   . Hyperlipemia   . IBS (irritable bowel syndrome)   . Postnasal drip   . RBBB (right bundle branch block)   . Rotator cuff syndrome    right    Social History   Social History  . Marital status: Single    Spouse name: N/A  . Number of children: N/A  . Years of education: 40   Occupational History  . manager     retired   Social History Main  Topics  . Smoking status: Former Research scientist (life sciences)  . Smokeless tobacco: Never Used     Comment: quit appox 10 yrs ago  . Alcohol use No     Comment: former [employer] AA 20+yrs ago strong recovery  . Drug use: No  . Sexual activity: Yes    Partners: Female   Other Topics Concern  . Not on file   Social History Narrative   [employer]   Long-term relationship - '95    Currently attending classes in medical coding (May '12)    Past Surgical History:  Procedure Laterality Date  . COLONOSCOPY    . MOUTH SURGERY  2002  . ROTATOR CUFF REPAIR  10-24-05   had bone spur and laceration off cuff. Daldorf    Family History  Problem Relation Age of Onset  . Hyperlipidemia Mother   . Irritable bowel syndrome Mother   . Coronary artery disease Mother   . Heart attack Mother   . Aneurysm Mother     brain  . Diabetes Brother   . Cancer Brother     small cell lung ca with mets/ smoker  . Stroke    . Aneurysm    . Lung cancer    . Colon cancer Neg Hx     Allergies  Allergen Reactions  . Codeine     Makes heart race  . Penicillins  unknown  . Sulfonamide Derivatives     Has a rash with the use of silvadiene for burn  . Aspirin     Upset stomach, High dose only    Current Outpatient Prescriptions on File Prior to Visit  Medication Sig Dispense Refill  . aspirin 81 MG tablet Take 81 mg by mouth daily.    . Calcium Carbonate-Vitamin D (CALTRATE 600+D) 600-400 MG-UNIT per tablet Take 1 tablet by mouth daily.      . cefUROXime (CEFTIN) 250 MG tablet Take 1 tablet (250 mg total) by mouth 2 (two) times daily with a meal. 20 tablet 0  . Chromium 1000 MCG TABS Take 1 tablet by mouth daily.    . diphenhydramine-acetaminophen (TYLENOL PM) 25-500 MG TABS tablet Take 1 tablet by mouth at bedtime as needed (for sleep).    . Echinacea 125 MG CAPS Take 1 capsule by mouth daily.     . fluticasone (FLONASE) 50 MCG/ACT nasal spray Place 2 sprays into both nostrils daily as needed for allergies or  rhinitis.    . hyoscyamine (LEVBID) 0.375 MG 12 hr tablet Take 1 tablet by mouth  twice a day 180 tablet 1  . loratadine (CLARITIN) 10 MG tablet Take 1 tablet (10 mg total) by mouth daily. 90 tablet 3  . methocarbamol (ROBAXIN) 500 MG tablet TAKE 1 TABLET BY MOUTH  EVERY 6 HOURS AS NEEDED FOR MUSCLE SPASM 270 tablet 1  . Multiple Vitamin (MULTIVITAMIN) capsule Take 1 capsule by mouth daily.      Marland Kitchen omeprazole (PRILOSEC) 20 MG capsule Take 20 mg by mouth daily.      . rosuvastatin (CRESTOR) 5 MG tablet Take 1 tablet (5 mg total) by mouth at bedtime. 90 tablet 3   No current facility-administered medications on file prior to visit.     BP 118/62   Temp 98.2 F (36.8 C) (Oral)   Ht 5' 6.5" (1.689 m)   Wt 141 lb 12.8 oz (64.3 kg)   BMI 22.54 kg/m       Objective:   Physical Exam  Constitutional: She is oriented to person, place, and time. She appears well-developed and well-nourished. No distress.  HENT:  Head: Normocephalic and atraumatic.  Right Ear: Hearing, tympanic membrane, external ear and ear canal normal.  Left Ear: Hearing, tympanic membrane, external ear and ear canal normal.  Nose: Rhinorrhea present. No mucosal edema. Right sinus exhibits frontal sinus tenderness. Left sinus exhibits frontal sinus tenderness.  Mouth/Throat: Uvula is midline, oropharynx is clear and moist and mucous membranes are normal. No oropharyngeal exudate.  Eyes: Conjunctivae are normal. Pupils are equal, round, and reactive to light. Right eye exhibits no discharge. Left eye exhibits no discharge.  Cardiovascular: Normal rate, regular rhythm, normal heart sounds and intact distal pulses.  Exam reveals no gallop and no friction rub.   No murmur heard. Pulmonary/Chest: Effort normal and breath sounds normal. No respiratory distress. She has no wheezes. She has no rales. She exhibits no tenderness.  Lymphadenopathy:    She has no cervical adenopathy.  Neurological: She is alert and oriented to person,  place, and time.  Skin: Skin is warm and dry. No rash noted. She is not diaphoretic. No erythema. No pallor.  Psychiatric: She has a normal mood and affect. Her behavior is normal. Judgment and thought content normal.  Nursing note and vitals reviewed.     Assessment & Plan:  1. Acute recurrent frontal sinusitis - doxycycline (VIBRAMYCIN) 100 MG capsule; Take 1 capsule (  100 mg total) by mouth 2 (two) times daily.  Dispense: 14 capsule; Refill: 0 - predniSONE (DELTASONE) 10 MG tablet; Take 1 tablet (10 mg total) by mouth daily with breakfast.  Dispense: 5 tablet; Refill: 0 -Stay well hydrated and rest - Follow up if no improvement in next 2 or 3 days 2. Cough - predniSONE (DELTASONE) 10 MG tablet; Take 1 tablet (10 mg total) by mouth daily with breakfast.  Dispense: 5 tablet; Refill: 0  Dorothyann Peng, NP

## 2016-01-15 MED FILL — hydrOXYzine HCL 25 MG TABS: 25 | 30 days supply | Qty: 90 | Fill #2

## 2016-01-15 MED FILL — ROSUVASTATIN CALCIUM 5 MG T: 5 | 90 days supply | Qty: 90 | Fill #2

## 2016-02-22 DIAGNOSIS — Z0101 Encounter for examination of eyes and vision with abnormal findings: Secondary | ICD-10-CM | POA: Diagnosis not present

## 2016-03-05 DIAGNOSIS — R69 Illness, unspecified: Secondary | ICD-10-CM | POA: Diagnosis not present

## 2016-04-09 ENCOUNTER — Other Ambulatory Visit: Payer: Medicare HMO | Admitting: *Deleted

## 2016-04-09 ENCOUNTER — Telehealth: Payer: Self-pay

## 2016-04-09 DIAGNOSIS — Z79899 Other long term (current) drug therapy: Secondary | ICD-10-CM

## 2016-04-09 LAB — LIPID PANEL
Chol/HDL Ratio: 3.1 ratio units (ref 0.0–4.4)
Cholesterol, Total: 159 mg/dL (ref 100–199)
HDL: 51 mg/dL (ref >39)
LDL Calculated: 82 mg/dL (ref 0–99)
TRIGLYCERIDES: 129 mg/dL (ref 0–149)
VLDL Cholesterol Cal: 26 mg/dL (ref 5–40)

## 2016-04-09 LAB — HEPATIC FUNCTION PANEL
ALK PHOS: 67 IU/L (ref 39–117)
ALT: 21 IU/L (ref 0–32)
AST: 27 IU/L (ref 0–40)
Albumin: 4.4 g/dL (ref 3.6–4.8)
Bilirubin Total: 0.3 mg/dL (ref 0.0–1.2)
Bilirubin, Direct: 0.09 mg/dL (ref 0.00–0.40)
Total Protein: 6.9 g/dL (ref 6.0–8.5)

## 2016-04-09 NOTE — Addendum Note (Signed)
Addended by: Eulis Foster on: 04/09/2016 08:14 AM   Modules accepted: Orders

## 2016-04-09 NOTE — Progress Notes (Signed)
pid

## 2016-04-09 NOTE — Telephone Encounter (Signed)
-----   Message from Josue Hector, MD sent at 04/09/2016  3:51 PM EST ----- Cholesterol is at goal and LFT's are normal F/U labs in 6 months

## 2016-04-09 NOTE — Telephone Encounter (Signed)
Patient is aware of lab results. Made an appointment for repeat lab work on 09/24/16. Patient verbalized understanding.

## 2016-04-09 NOTE — Addendum Note (Signed)
Addended by: Eulis Foster on: 04/09/2016 08:13 AM   Modules accepted: Orders

## 2016-04-29 ENCOUNTER — Other Ambulatory Visit: Payer: Self-pay | Admitting: *Deleted

## 2016-04-29 MED ORDER — ROSUVASTATIN CALCIUM 5 MG PO TABS: 5.0000 mg | ORAL_TABLET | Freq: Every day | ORAL | 1 refills | Status: DC

## 2016-05-14 ENCOUNTER — Ambulatory Visit (INDEPENDENT_AMBULATORY_CARE_PROVIDER_SITE_OTHER): Payer: Medicare HMO | Admitting: Family Medicine

## 2016-05-14 ENCOUNTER — Encounter: Payer: Self-pay | Admitting: Family Medicine

## 2016-05-14 VITALS — BP 120/70 | HR 81 | Temp 98.2°F | Wt 144.3 lb

## 2016-05-14 DIAGNOSIS — J0110 Acute frontal sinusitis, unspecified: Secondary | ICD-10-CM

## 2016-05-14 MED ORDER — CEFUROXIME AXETIL 250 MG PO TABS: 250.0000 mg | ORAL_TABLET | Freq: Two times a day (BID) | ORAL | 0 refills | Status: DC

## 2016-05-14 MED ORDER — HYDROXYZINE HCL 25 MG PO TABS: 25.0000 mg | ORAL_TABLET | Freq: Three times a day (TID) | ORAL | 1 refills | Status: DC | PRN

## 2016-05-14 NOTE — Progress Notes (Signed)
Pre visit review using our clinic review tool, if applicable. No additional management support is needed unless otherwise documented below in the visit note. 

## 2016-05-14 NOTE — Patient Instructions (Signed)

## 2016-05-14 NOTE — Progress Notes (Signed)
Subjective:     Patient ID: Teresa Booker, female   DOB: 01/09/1946, 71 y.o.   MRN: [id number]  HPI Is seen with approximately 10-12 day history of increasing frontal sinus pressure and pain. She was up in [place] couple of weeks ago and symptoms started then and have progressed. She has intermittent greenish nasal discharge. She's tried saline irrigation and Flonase without improvement. No fevers or chills. Increased malaise. Occasional productive cough. No hemoptysis.  Past Medical History:  Diagnosis Date  . Alcohol abuse    - abstemious 20 years. Attends AA - strong recovery   . Allergic reaction   . Allergy    seasonal  . Anxiety   . Fibromyalgia   . GERD (gastroesophageal reflux disease)   . Hyperlipemia   . IBS (irritable bowel syndrome)   . Postnasal drip   . RBBB (right bundle branch block)   . Rotator cuff syndrome    right   Past Surgical History:  Procedure Laterality Date  . COLONOSCOPY    . MOUTH SURGERY  2002  . ROTATOR CUFF REPAIR  10-24-05   had bone spur and laceration off cuff. Daldorf    reports that she has quit smoking. She has never used smokeless tobacco. She reports that she does not drink alcohol or use drugs. family history includes Aneurysm in her mother; Cancer in her brother; Coronary artery disease in her mother; Diabetes in her brother; Heart attack in her mother; Hyperlipidemia in her mother; Irritable bowel syndrome in her mother. Allergies  Allergen Reactions  . Codeine     Makes heart race  . Penicillins     unknown  . Sulfonamide Derivatives     Has a rash with the use of silvadiene for burn  . Aspirin     Upset stomach, High dose only     Review of Systems  Constitutional: Positive for fatigue. Negative for chills and fever.  HENT: Positive for congestion, sinus pain and sinus pressure.   Respiratory: Positive for cough.   Neurological: Positive for headaches.       Objective:   Physical Exam  Constitutional: She appears  well-developed and well-nourished.  HENT:  Right Ear: External ear normal.  Left Ear: External ear normal.  Mouth/Throat: Oropharynx is clear and moist.  Erythematous nasal mucosa otherwise clear  Neck: Neck supple.  Cardiovascular: Normal rate and regular rhythm.   Pulmonary/Chest: Effort normal and breath sounds normal. No respiratory distress. She has no wheezes. She has no rales.  Lymphadenopathy:    She has no cervical adenopathy.       Assessment:     Probable acute frontal sinusitis    Plan:     -Continue saline nasal irrigation and Flonase -Stay well-hydrated -Ceftin 250 mg twice daily for 10 days  [name] MD [facility]

## 2016-08-22 ENCOUNTER — Other Ambulatory Visit: Payer: Self-pay | Admitting: Obstetrics & Gynecology

## 2016-08-22 DIAGNOSIS — Z1231 Encounter for screening mammogram for malignant neoplasm of breast: Secondary | ICD-10-CM

## 2016-09-13 ENCOUNTER — Ambulatory Visit
Admission: RE | Admit: 2016-09-13 | Discharge: 2016-09-13 | Disposition: A | Discharge status: Home / Self Care | Payer: Medicare HMO | Source: Ambulatory Visit | Attending: Obstetrics & Gynecology | Admitting: Obstetrics & Gynecology

## 2016-09-13 DIAGNOSIS — Z1231 Encounter for screening mammogram for malignant neoplasm of breast: Secondary | ICD-10-CM | POA: Diagnosis not present

## 2016-09-17 ENCOUNTER — Other Ambulatory Visit: Payer: Self-pay | Admitting: Family Medicine

## 2016-09-24 ENCOUNTER — Other Ambulatory Visit: Payer: Medicare HMO

## 2016-09-24 DIAGNOSIS — Z79899 Other long term (current) drug therapy: Secondary | ICD-10-CM | POA: Diagnosis not present

## 2016-09-24 LAB — HEPATIC FUNCTION PANEL
ALT: 21 IU/L (ref 0–32)
AST: 27 IU/L (ref 0–40)
Albumin: 4.3 g/dL (ref 3.6–4.8)
Alkaline Phosphatase: 70 IU/L (ref 39–117)
Bilirubin Total: 0.3 mg/dL (ref 0.0–1.2)
Bilirubin, Direct: 0.07 mg/dL (ref 0.00–0.40)
Total Protein: 6.5 g/dL (ref 6.0–8.5)

## 2016-09-24 LAB — LIPID PANEL
Chol/HDL Ratio: 2.8 ratio (ref 0.0–4.4)
Cholesterol, Total: 152 mg/dL (ref 100–199)
HDL: 54 mg/dL (ref >39)
LDL Calculated: 78 mg/dL (ref 0–99)
Triglycerides: 98 mg/dL (ref 0–149)
VLDL CHOLESTEROL CAL: 20 mg/dL (ref 5–40)

## 2016-09-25 ENCOUNTER — Telehealth: Payer: Self-pay

## 2016-09-25 DIAGNOSIS — Z79899 Other long term (current) drug therapy: Secondary | ICD-10-CM

## 2016-09-25 DIAGNOSIS — E785 Hyperlipidemia, unspecified: Secondary | ICD-10-CM

## 2016-09-25 DIAGNOSIS — R69 Illness, unspecified: Secondary | ICD-10-CM | POA: Diagnosis not present

## 2016-09-25 NOTE — Telephone Encounter (Signed)
Called patient with lab results. Per Dr. Johnsie Cancel, Cholesterol is at goal and LFT's are normal F/U labs in 6 months. Patient verbalized understanding and will come in 04/02/17 for repeat lab work.

## 2016-09-25 NOTE — Telephone Encounter (Signed)
-----   Message from Josue Hector, MD sent at 09/24/2016 10:31 PM EDT ----- Cholesterol is at goal and LFT's are normal F/U labs in 6 months

## 2016-10-28 ENCOUNTER — Other Ambulatory Visit: Payer: Self-pay | Admitting: Cardiovascular Disease

## 2016-10-31 ENCOUNTER — Encounter: Payer: Self-pay | Admitting: Family Medicine

## 2016-11-18 ENCOUNTER — Other Ambulatory Visit: Payer: Self-pay | Admitting: Family Medicine

## 2016-11-19 NOTE — Telephone Encounter (Signed)
Refill once 

## 2016-11-19 NOTE — Telephone Encounter (Signed)
Rx done. 

## 2016-11-25 ENCOUNTER — Other Ambulatory Visit: Payer: Self-pay | Admitting: Cardiovascular Disease

## 2016-12-03 NOTE — Progress Notes (Signed)
Patient ID: Teresa Booker, female   DOB: 1945-12-02, 71 y.o.   MRN: [id number] Teresa Booker is seen today for F/U of cholesterol and abnormal ECG. She goes by "Teresa Booker" Her LDL is great at 78 with normal LFTs and no side effects from low dose crestor. She took early retirement from [employer] and her stress level is much lower. Working at [employer]  ECG has shown [employer]' with no changes and is probably normal for her. No SSCP, palpitations, dyspnea. Previous palpable aorta but [place] with no AAA Back on weight watchers and doing well.   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  All other systems reviewed and negative  General: Affect appropriate Healthy:  appears stated age 71: normal Neck supple with no adenopathy JVP normal no bruits no thyromegaly Lungs clear with no wheezing and good diaphragmatic motion Heart:  S1/S2 no murmur, no rub, gallop or click PMI normal Abdomen: benighn, BS positve, no tenderness, no AAA no bruit.  No HSM or HJR Distal pulses intact with no bruits No edema Neuro non-focal Skin warm and dry No muscular weakness   Current Outpatient Prescriptions  Medication Sig Dispense Refill  . aspirin 81 MG tablet Take 81 mg by mouth daily.    . Chromium 1000 MCG TABS Take 1 tablet by mouth daily.    . diphenhydramine-acetaminophen (TYLENOL PM) 25-500 MG TABS tablet Take 1 tablet by mouth at bedtime as needed (for sleep).    . Echinacea 125 MG CAPS Take 1 capsule by mouth daily.     . fluticasone (FLONASE) 50 MCG/ACT nasal spray Place 2 sprays into both nostrils daily as needed for allergies or rhinitis.    . hydrOXYzine (ATARAX/VISTARIL)  25 MG tablet TAKE 1 TABLET BY MOUTH EVERY 8 HOURS AS NEEDED. 90 tablet 0  . hyoscyamine (LEVBID) 0.375 MG 12 hr tablet TAKE ONE TABLET BY MOUTH TWICE DAILY 180 tablet 1  . L-Lysine 500 MG TABS Take 500 mg by mouth daily.    Marland Kitchen loratadine (CLARITIN) 10 MG tablet Take 1 tablet (10 mg total) by mouth daily. 90 tablet 3  . Magnesium 250 MG TABS Take 250 mg by mouth daily.    . Multiple Vitamin (MULTIVITAMIN) capsule Take 1 capsule by mouth daily.      Marland Kitchen omeprazole (PRILOSEC) 20 MG capsule Take 20 mg by mouth daily.      . rosuvastatin (CRESTOR) 5 MG tablet TAKE 1 TABLET BY MOUTH AT BEDTIME 30 tablet 11   No current facility-administered medications for this visit.     Allergies  Codeine; Penicillins; Sulfonamide derivatives; and Aspirin  Electrocardiogram:`  11/04/13  NSR rate 78  Normal  12/06/16 NSR normal   Assessment and Plan  Abnormal ECG  RSR' not clinically significant and stable ECG today normal   Chol: on crestor LDL 78 09/24/16 with normal LFT;s   Lumbago:  Continue robaxin f/u primary   Primary:  Flu shot given today  Previous Smoker:  CXR today has never had one   F/u with me as needed    [employer]

## 2016-12-06 ENCOUNTER — Ambulatory Visit (INDEPENDENT_AMBULATORY_CARE_PROVIDER_SITE_OTHER): Payer: Medicare HMO | Admitting: Cardiovascular Disease

## 2016-12-06 ENCOUNTER — Encounter: Payer: Self-pay | Admitting: Cardiovascular Disease

## 2016-12-06 VITALS — BP 146/74 | HR 79 | Ht 66.0 in | Wt 146.8 lb

## 2016-12-06 DIAGNOSIS — Z23 Encounter for immunization: Secondary | ICD-10-CM | POA: Diagnosis not present

## 2016-12-06 DIAGNOSIS — Z87891 Personal history of nicotine dependence: Secondary | ICD-10-CM | POA: Diagnosis not present

## 2016-12-06 DIAGNOSIS — I4510 Unspecified right bundle-branch block: Secondary | ICD-10-CM

## 2016-12-06 NOTE — Patient Instructions (Signed)
Medication Instructions:  Your physician recommends that you continue on your current medications as directed. Please refer to the Current Medication list given to you today.  Labwork: NONE  Testing/Procedures: A chest x-ray takes a picture of the organs and structures inside the chest, including the heart, lungs, and blood vessels. This test can show several things, including, whether the heart is enlarges; whether fluid is building up in the lungs; and whether pacemaker / defibrillator leads are still in place.  Follow-Up: Your physician wants you to follow-up as needed with Dr. Johnsie Cancel.    If you need a refill on your cardiac medications before your next appointment, please call your pharmacy.

## 2016-12-17 ENCOUNTER — Ambulatory Visit
Admission: RE | Admit: 2016-12-17 | Discharge: 2016-12-17 | Disposition: A | Discharge status: Home / Self Care | Payer: Medicare HMO | Source: Ambulatory Visit | Attending: Cardiovascular Disease | Admitting: Cardiovascular Disease

## 2016-12-17 DIAGNOSIS — Z87891 Personal history of nicotine dependence: Secondary | ICD-10-CM

## 2017-01-21 DIAGNOSIS — R69 Illness, unspecified: Secondary | ICD-10-CM | POA: Diagnosis not present

## 2017-02-17 ENCOUNTER — Other Ambulatory Visit: Payer: Self-pay | Admitting: Family Medicine

## 2017-04-02 ENCOUNTER — Other Ambulatory Visit: Payer: Medicare HMO | Admitting: *Deleted

## 2017-04-02 ENCOUNTER — Telehealth: Payer: Self-pay

## 2017-04-02 DIAGNOSIS — Z79899 Other long term (current) drug therapy: Secondary | ICD-10-CM

## 2017-04-02 DIAGNOSIS — E785 Hyperlipidemia, unspecified: Secondary | ICD-10-CM | POA: Diagnosis not present

## 2017-04-02 LAB — LIPID PANEL
CHOL/HDL RATIO: 2.7 ratio (ref 0.0–4.4)
Cholesterol, Total: 153 mg/dL (ref 100–199)
HDL: 57 mg/dL (ref >39)
LDL Calculated: 70 mg/dL (ref 0–99)
Triglycerides: 129 mg/dL (ref 0–149)
VLDL Cholesterol Cal: 26 mg/dL (ref 5–40)

## 2017-04-02 LAB — HEPATIC FUNCTION PANEL
ALK PHOS: 77 IU/L (ref 39–117)
ALT: 24 IU/L (ref 0–32)
AST: 24 IU/L (ref 0–40)
Albumin: 4.6 g/dL (ref 3.6–4.8)
BILIRUBIN, DIRECT: 0.11 mg/dL (ref 0.00–0.40)
Bilirubin Total: 0.4 mg/dL (ref 0.0–1.2)
Total Protein: 7.2 g/dL (ref 6.0–8.5)

## 2017-04-02 NOTE — Telephone Encounter (Signed)
-----   Message from Josue Hector, MD sent at 04/02/2017  5:14 PM EST ----- Cholesterol is at goal and LFT's are normal F/U labs in 6 months

## 2017-04-02 NOTE — Telephone Encounter (Signed)
Order placed for labs.

## 2017-04-10 DIAGNOSIS — H2513 Age-related nuclear cataract, bilateral: Secondary | ICD-10-CM | POA: Diagnosis not present

## 2017-04-15 ENCOUNTER — Telehealth: Payer: Self-pay

## 2017-04-15 DIAGNOSIS — R002 Palpitations: Secondary | ICD-10-CM

## 2017-04-15 NOTE — Telephone Encounter (Signed)
Per Dr. Johnsie Cancel, can order 48 hour holter monitor for palpitations.

## 2017-04-18 ENCOUNTER — Ambulatory Visit (INDEPENDENT_AMBULATORY_CARE_PROVIDER_SITE_OTHER): Payer: Medicare HMO

## 2017-04-18 DIAGNOSIS — R002 Palpitations: Secondary | ICD-10-CM

## 2017-04-22 ENCOUNTER — Telehealth: Payer: Self-pay

## 2017-04-22 DIAGNOSIS — I493 Ventricular premature depolarization: Secondary | ICD-10-CM

## 2017-04-22 MED ORDER — METOPROLOL SUCCINATE ER 50 MG PO TB24: 50.0000 mg | ORAL_TABLET | Freq: Every day | ORAL | 3 refills | Status: DC

## 2017-04-22 MED FILL — METOPROLOL SUCCINATE ER 50: 50 | 90 days supply | Qty: 90 | Fill #0

## 2017-04-22 NOTE — Telephone Encounter (Addendum)
Message sent through [place] "Per Dr. Johnsie Cancel, your monitor only showed PVCs. You are having PVC's frequent enough to start a beta blocker, Metoprolol 50 mg by mouth daily. Dr. Johnsie Cancel would like you to have a exercise myoview, an echo and an office visit with him after your test. Someone will call you and schedule these appointments."  Sent message through [place] of instructions for myoview.

## 2017-04-22 NOTE — Telephone Encounter (Signed)
-----   Message from Josue Hector, MD sent at 04/22/2017  2:17 PM EDT ----- Monitor only shows PVCls  Frequent enough to start beta blocker Toprol 50 mg And do exercise myovue and echo f/u with me after tests

## 2017-05-01 ENCOUNTER — Telehealth (HOSPITAL_COMMUNITY): Payer: Self-pay | Admitting: *Deleted

## 2017-05-01 NOTE — Telephone Encounter (Signed)
Patient given detailed instructions per Myocardial Perfusion Study Information Sheet for the test on  05/06/17. Patient notified to arrive 15 minutes early and that it is imperative to arrive on time for appointment to keep from having the test rescheduled.  If you need to cancel or reschedule your appointment, please call the office within 24 hours of your appointment. . Patient verbalized understanding. Kirstie Peri

## 2017-05-06 ENCOUNTER — Ambulatory Visit (HOSPITAL_COMMUNITY): Payer: Medicare HMO | Attending: Cardiovascular Disease

## 2017-05-06 ENCOUNTER — Other Ambulatory Visit: Payer: Self-pay

## 2017-05-06 ENCOUNTER — Ambulatory Visit (HOSPITAL_BASED_OUTPATIENT_CLINIC_OR_DEPARTMENT_OTHER): Payer: Medicare HMO

## 2017-05-06 DIAGNOSIS — I4510 Unspecified right bundle-branch block: Secondary | ICD-10-CM | POA: Insufficient documentation

## 2017-05-06 DIAGNOSIS — I493 Ventricular premature depolarization: Secondary | ICD-10-CM | POA: Insufficient documentation

## 2017-05-06 DIAGNOSIS — R002 Palpitations: Secondary | ICD-10-CM | POA: Diagnosis not present

## 2017-05-06 DIAGNOSIS — Z87891 Personal history of nicotine dependence: Secondary | ICD-10-CM | POA: Insufficient documentation

## 2017-05-06 LAB — MYOCARDIAL PERFUSION IMAGING
CHL CUP RESTING HR STRESS: 84 {beats}/min
CSEPED: 4 min
Estimated workload: 5.8 METS
Exercise duration (sec): 0 s
LV sys vol: 22 mL
LVDIAVOL: 75 mL (ref 46–106)
MPHR: 151 {beats}/min
Peak HR: 144 {beats}/min
Percent HR: 95 %
RATE: 0.31
SDS: 2
SRS: 1
SSS: 3
TID: 0.9

## 2017-05-06 MED ORDER — TECHNETIUM TC 99M TETROFOSMIN IV KIT
32.4000 | PACK | Freq: Once | INTRAVENOUS | Status: AC | PRN
  Administered 2017-05-06: 32.4 via INTRAVENOUS
  Filled 2017-05-06: qty 33

## 2017-05-06 MED ORDER — TECHNETIUM TC 99M TETROFOSMIN IV KIT
10.7000 | PACK | Freq: Once | INTRAVENOUS | Status: AC | PRN
  Administered 2017-05-06: 10.7 via INTRAVENOUS
  Filled 2017-05-06: qty 11

## 2017-05-11 ENCOUNTER — Other Ambulatory Visit: Payer: Self-pay | Admitting: Family Medicine

## 2017-05-12 NOTE — Progress Notes (Signed)
Patient ID: Teresa Booker, female   DOB: 04-12-1945, 72 y.o.   MRN: [id number]   72 y.o. 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 "[name]" has HLD on statin. She has had recent palpitations with holter 04/18/17 showing some  PVCls Started on beta blocker but felt fatigue and dose lowered Toprol 50 gm ->25 mg   She had normal myovue 05/06/17 EF 70%  05/06/17 echo reviewed EF 60-65% some anterior leaflet prolapse no significant MR  Feels fine now except for some allergies and nasal congestion. Taking flonase and claritin   ROS: Denies fever, malais, weight loss, blurry vision, decreased visual acuity, cough, sputum, SOB, hemoptysis, pleuritic pain, palpitaitons, heartburn, abdominal pain, melena, lower extremity edema, claudication, or rash.  All other systems reviewed and negative  General: BP 122/72   Pulse 70   Ht 5\' 10"  (1.778 m)   Wt 147 lb (66.7 kg)   SpO2 97%   BMI 21.09 kg/m  Affect appropriate Healthy:  appears stated age 22: normal Neck supple with no adenopathy JVP normal no bruits no thyromegaly Lungs clear with no wheezing and good diaphragmatic motion Heart:  S1/S2 no murmur, no rub, gallop or click PMI normal Abdomen: benighn, BS positve, no tenderness, no AAA no bruit.  No HSM or HJR Distal pulses intact with no bruits No edema Neuro non-focal Skin warm and dry No muscular weakness    Current Outpatient Medications  Medication Sig Dispense Refill  . aspirin 81 MG tablet Take 81 mg by mouth daily.    . Chromium 1000 MCG TABS Take 1 tablet by mouth daily.    . diphenhydramine-acetaminophen (TYLENOL PM) 25-500 MG TABS tablet Take 1 tablet by mouth at bedtime as needed (for sleep).    . Echinacea 125 MG CAPS Take 1 capsule by mouth daily.     . fluticasone (FLONASE) 50 MCG/ACT nasal spray Place 2 sprays into both nostrils daily as needed for allergies or rhinitis.    . hydrOXYzine (ATARAX/VISTARIL) 25 MG tablet TAKE 1 TABLET BY MOUTH EVERY 8 HOURS AS NEEDED.  90 tablet 0  . hyoscyamine (LEVBID) 0.375 MG 12 hr tablet TAKE ONE TABLET BY MOUTH TWICE DAILY 180 tablet 1  . L-Lysine 500 MG TABS Take 500 mg by mouth daily.    Marland Kitchen loratadine (CLARITIN) 10 MG tablet Take 1 tablet (10 mg total) by mouth daily. 90 tablet 3  . Magnesium 250 MG TABS Take 250 mg by mouth daily.    . Multiple Vitamin (MULTIVITAMIN) capsule Take 1 capsule by mouth daily.      Marland Kitchen omeprazole (PRILOSEC) 20 MG capsule Take 20 mg by mouth daily.      . rosuvastatin (CRESTOR) 5 MG tablet TAKE 1 TABLET BY MOUTH AT BEDTIME 30 tablet 11  . metoprolol succinate (TOPROL XL) 25 MG 24 hr tablet Take 1 tablet (25 mg total) by mouth daily. 90 tablet 3   No current facility-administered medications for this visit.     Allergies  Codeine; Penicillins; Sulfonamide derivatives; and Aspirin  Electrocardiogram:`  11/04/13  NSR rate 78  Normal  12/06/16 NSR normal   Assessment and Plan  Abnormal ECG  RSR' not clinically significant and stable ECG today normal   Chol: on crestor LDL 78 09/24/16 with normal LFT;s    Previous Smoker:  CXR  12/17/16 NAD  Palpitations: with PVCls on Holter Normal echo and Myoview  with no evidence of structural heart disease. Fatigue with  Toprol 50 mg dose lowered to  25 mg better now with no hypotension   Allergies:  Pollen season continue flonase and claritin PRN sinex   F/u with me in 6 months    Jenkins Rouge

## 2017-05-15 ENCOUNTER — Ambulatory Visit: Payer: Medicare HMO | Admitting: Cardiovascular Disease

## 2017-05-15 ENCOUNTER — Encounter: Payer: Self-pay | Admitting: Cardiovascular Disease

## 2017-05-15 VITALS — BP 122/72 | HR 70 | Ht 70.0 in | Wt 147.0 lb

## 2017-05-15 DIAGNOSIS — R002 Palpitations: Secondary | ICD-10-CM

## 2017-05-15 DIAGNOSIS — I493 Ventricular premature depolarization: Secondary | ICD-10-CM

## 2017-05-15 MED ORDER — METOPROLOL SUCCINATE ER 25 MG PO TB24: 25.0000 mg | ORAL_TABLET | Freq: Every day | ORAL | 3 refills | Status: DC

## 2017-05-15 NOTE — Patient Instructions (Addendum)
Medication Instructions:  Your physician recommends that you continue on your current medications as directed. Please refer to the Current Medication list given to you today.  Labwork: NONE  Testing/Procedures: NONE  Follow-Up: Your physician recommends that you schedule a follow-up appointment in: 6 month with Dr. Johnsie Cancel.   If you need a refill on your cardiac medications before your next appointment, please call your pharmacy.

## 2017-05-27 DIAGNOSIS — R69 Illness, unspecified: Secondary | ICD-10-CM | POA: Diagnosis not present

## 2017-06-03 DIAGNOSIS — Z0100 Encounter for examination of eyes and vision without abnormal findings: Secondary | ICD-10-CM | POA: Diagnosis not present

## 2017-08-04 ENCOUNTER — Other Ambulatory Visit: Payer: Self-pay | Admitting: Obstetrics & Gynecology

## 2017-08-04 DIAGNOSIS — Z1231 Encounter for screening mammogram for malignant neoplasm of breast: Secondary | ICD-10-CM

## 2017-08-17 ENCOUNTER — Other Ambulatory Visit: Payer: Self-pay | Admitting: Family Medicine

## 2017-08-20 ENCOUNTER — Encounter: Payer: Self-pay | Admitting: Family Medicine

## 2017-08-20 MED ORDER — PREDNISONE 20 MG PO TABS: ORAL_TABLET | ORAL | 0 refills | Status: DC

## 2017-08-21 ENCOUNTER — Other Ambulatory Visit: Payer: Self-pay | Admitting: Family Medicine

## 2017-08-22 MED ORDER — HYDROXYZINE HCL 25 MG PO TABS: 25.0000 mg | ORAL_TABLET | Freq: Three times a day (TID) | ORAL | 0 refills | Status: DC | PRN

## 2017-08-26 ENCOUNTER — Ambulatory Visit: Payer: Medicare HMO | Admitting: Family Medicine

## 2017-08-26 ENCOUNTER — Encounter: Payer: Self-pay | Admitting: Family Medicine

## 2017-08-26 VITALS — BP 130/80 | HR 70 | Temp 98.3°F | Wt 147.7 lb

## 2017-08-26 DIAGNOSIS — J309 Allergic rhinitis, unspecified: Secondary | ICD-10-CM

## 2017-08-26 DIAGNOSIS — K589 Irritable bowel syndrome without diarrhea: Secondary | ICD-10-CM

## 2017-08-26 MED ORDER — HYOSCYAMINE SULFATE ER 0.375 MG PO TB12: 0.3750 mg | ORAL_TABLET | Freq: Two times a day (BID) | ORAL | 1 refills | Status: DC

## 2017-08-26 MED ORDER — HYDROXYZINE HCL 25 MG PO TABS: 25.0000 mg | ORAL_TABLET | Freq: Three times a day (TID) | ORAL | 11 refills | Status: DC | PRN

## 2017-08-26 NOTE — Progress Notes (Signed)
  Subjective:     Patient ID: Teresa Booker, female   DOB: Dec 06, 1945, 72 y.o.   MRN: [id number]  HPI Patient seen for medical follow-up requesting couple refills. She has history of IBS and takes Levbid as needed. Requesting refills. Symptoms relatively stable.  She has history of frequent allergic postnasal drip and takes regular Claritin and has been on regimen of Atarax 25 mg at night for years. Requesting refill. Did not do well with Benadryl.  Patient sees gynecologist for yearly physicals and mammograms  Past Medical History:  Diagnosis Date  . Alcohol abuse    - abstemious 20 years. Attends AA - strong recovery   . Allergic reaction   . Allergy    seasonal  . Anxiety   . Fibromyalgia   . GERD (gastroesophageal reflux disease)   . Hyperlipemia   . IBS (irritable bowel syndrome)   . Postnasal drip   . RBBB (right bundle branch block)   . Rotator cuff syndrome    right   Past Surgical History:  Procedure Laterality Date  . COLONOSCOPY    . MOUTH SURGERY  2002  . ROTATOR CUFF REPAIR  10-24-05   had bone spur and laceration off cuff. Daldorf    reports that she has quit smoking. She has never used smokeless tobacco. She reports that she does not drink alcohol or use drugs. family history includes Aneurysm in her mother and unknown relative; Cancer in her brother; Coronary artery disease in her mother; Diabetes in her brother; Heart attack in her mother; Hyperlipidemia in her mother; Irritable bowel syndrome in her mother; Lung cancer in her unknown relative; Stroke in her unknown relative. Allergies  Allergen Reactions  . Codeine     Makes heart race  . Penicillins     unknown  . Sulfonamide Derivatives     Has a rash with the use of silvadiene for burn  . Aspirin     Upset stomach, High dose only     Review of Systems  Constitutional: Negative for chills and fever.  HENT: Negative for congestion.   Respiratory: Negative for cough and shortness of breath.    Cardiovascular: Negative for chest pain.       Objective:   Physical Exam  Constitutional: She appears well-developed and well-nourished.  HENT:  Right Ear: External ear normal.  Left Ear: External ear normal.  Mouth/Throat: Oropharynx is clear and moist.  Neck: Neck supple.  Cardiovascular: Normal rate and regular rhythm.  Pulmonary/Chest: Effort normal and breath sounds normal.  Lymphadenopathy:    She has no cervical adenopathy.       Assessment:     #1 IBS symptomatically stable  #2 perennial allergies    Plan:     -Refilled Atarax and levbid for one year -She will continue with regular GYN follow-up for physicals -follow up in one year and sooner as needed.  [name] MD [facility]

## 2017-08-27 ENCOUNTER — Encounter: Payer: Self-pay | Admitting: Family Medicine

## 2017-08-27 MED ORDER — PREDNISONE 20 MG PO TABS: ORAL_TABLET | ORAL | 1 refills | Status: DC

## 2017-09-16 ENCOUNTER — Ambulatory Visit
Admission: RE | Admit: 2017-09-16 | Discharge: 2017-09-16 | Disposition: A | Discharge status: Home / Self Care | Payer: Medicare HMO | Source: Ambulatory Visit | Attending: Obstetrics & Gynecology | Admitting: Obstetrics & Gynecology

## 2017-09-16 DIAGNOSIS — Z1231 Encounter for screening mammogram for malignant neoplasm of breast: Secondary | ICD-10-CM | POA: Diagnosis not present

## 2017-10-02 DIAGNOSIS — R69 Illness, unspecified: Secondary | ICD-10-CM | POA: Diagnosis not present

## 2017-11-25 ENCOUNTER — Other Ambulatory Visit: Payer: Self-pay | Admitting: Cardiovascular Disease

## 2017-12-08 ENCOUNTER — Telehealth: Payer: Self-pay | Admitting: Gastroenterology

## 2017-12-09 DIAGNOSIS — R69 Illness, unspecified: Secondary | ICD-10-CM | POA: Diagnosis not present

## 2017-12-09 NOTE — Telephone Encounter (Signed)
Left message that her recall is in November of 2021.

## 2017-12-23 NOTE — Progress Notes (Signed)
Patient ID: ZURIA FOSDICK, female   DOB: [birth year], 72 y.o.   MRN: [id number]   72 y.o. 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 "[name]" has HLD on statin. She has had  palpitations with holter 04/18/17 showing some  PVCls Started on beta blocker but felt fatigue and dose lowered Toprol 50 gm ->25 mg   She had normal myovue 05/06/17 EF 70%  05/06/17 echo reviewed EF 60-65% some anterior leaflet prolapse no significant MR  Doing well with no cardiac symptoms No palpitaitons    ROS: Denies fever, malais, weight loss, blurry vision, decreased visual acuity, cough, sputum, SOB, hemoptysis, pleuritic pain, palpitaitons, heartburn, abdominal pain, melena, lower extremity edema, claudication, or rash.  All other systems reviewed and negative  General: BP 136/74   Pulse 80   Ht 5\' 10"  (1.778 m)   Wt 148 lb (67.1 kg)   SpO2 97%   BMI 21.24 kg/m  Affect appropriate Healthy:  appears stated age 72: normal Neck supple with no adenopathy JVP normal no bruits no thyromegaly Lungs clear with no wheezing and good diaphragmatic motion Heart:  S1/S2 no murmur, no rub, gallop or click PMI normal Abdomen: benighn, BS positve, no tenderness, no AAA no bruit.  No HSM or HJR Distal pulses intact with no bruits No edema Neuro non-focal Skin warm and dry No muscular weakness     Current Outpatient Medications  Medication Sig Dispense Refill  . aspirin 81 MG tablet Take 81 mg by mouth daily.    . Calcium 200 MG TABS Take 1 tablet by mouth daily.    . Chromium 1000 MCG TABS Take 1 tablet by mouth daily.    . diphenhydramine-acetaminophen (TYLENOL PM) 25-500 MG TABS tablet Take 1 tablet by mouth at bedtime as needed (for sleep).    . Echinacea 125 MG CAPS Take 1 capsule by mouth daily.     . fluticasone (FLONASE) 50 MCG/ACT nasal spray Place 2 sprays into both nostrils daily as needed for allergies or rhinitis.    . hydrOXYzine (ATARAX/VISTARIL) 25 MG tablet Take 1 tablet (25 mg total) by  mouth every 8 (eight) hours as needed. 30 tablet 11  . hyoscyamine (LEVBID) 0.375 MG 12 hr tablet Take 1 tablet (0.375 mg total) by mouth 2 (two) times daily. 180 tablet 1  . L-Lysine 500 MG TABS Take 500 mg by mouth daily.    Marland Kitchen loratadine (CLARITIN) 10 MG tablet Take 1 tablet (10 mg total) by mouth daily. 90 tablet 3  . Magnesium 250 MG TABS Take 250 mg by mouth daily.    . metoprolol succinate (TOPROL XL) 25 MG 24 hr tablet Take 1 tablet (25 mg total) by mouth daily. 90 tablet 3  . Multiple Vitamin (MULTIVITAMIN) capsule Take 1 capsule by mouth daily.      . Multiple Vitamins-Minerals (OCUVITE PRESERVISION PO) Take 1 tablet by mouth daily.    Marland Kitchen omeprazole (PRILOSEC) 20 MG capsule Take 20 mg by mouth daily.      . predniSONE (DELTASONE) 20 MG tablet Take 2 tablets daily for 5 days. 10 tablet 1  . rosuvastatin (CRESTOR) 5 MG tablet TAKE 1 TABLET BY MOUTH AT BEDTIME 30 tablet 6   No current facility-administered medications for this visit.     Allergies  Codeine; Penicillins; Sulfonamide derivatives; and Aspirin  Electrocardiogram:`  11/04/13  NSR rate 78  Normal  12/06/16 NSR normal  12/29/17 NSR rate 69 normal ECG  Assessment and Plan  Abnormal ECG  RSR' not clinically significant and stable ECG today normal   Chol: on crestor LDL 70 04/03/27 with normal LFTls f/u labs in February  Previous Smoker:  CXR  12/17/16 NAD no symptoms   Palpitations: with PVCls on Holter Normal echo and Myoview  with no evidence of structural heart disease. Fatigue with  Toprol 50 mg dose lowered to 25 mg better now with no hypotension   Allergies:  Continue flonase and claritin PRN sinex   F/u with me in 6 months    Jenkins Rouge

## 2017-12-29 ENCOUNTER — Encounter: Payer: Self-pay | Admitting: Cardiovascular Disease

## 2017-12-29 ENCOUNTER — Ambulatory Visit: Payer: Medicare HMO | Admitting: Cardiovascular Disease

## 2017-12-29 VITALS — BP 136/74 | HR 80 | Ht 70.0 in | Wt 148.0 lb

## 2017-12-29 DIAGNOSIS — E785 Hyperlipidemia, unspecified: Secondary | ICD-10-CM | POA: Diagnosis not present

## 2017-12-29 DIAGNOSIS — I4510 Unspecified right bundle-branch block: Secondary | ICD-10-CM | POA: Diagnosis not present

## 2017-12-29 DIAGNOSIS — R002 Palpitations: Secondary | ICD-10-CM

## 2017-12-29 NOTE — Patient Instructions (Signed)
Medication Instructions:   If you need a refill on your cardiac medications before your next appointment, please call your pharmacy.   Lab work: Your physician recommends that you return for lab work in: last week in February 2020 for Lipid and liver panel and TSH  If you have labs (blood work) drawn today and your tests are completely normal, you will receive your results only by: Marland Kitchen MyChart Message (if you have MyChart) OR . A paper copy in the mail If you have any lab test that is abnormal or we need to change your treatment, we will call you to review the results.  Testing/Procedures: NONE ordered today.  Follow-Up: At [facility], you and your health needs are our priority.  As part of our continuing mission to provide you with exceptional heart care, we have created designated Provider Care Teams.  These Care Teams include your primary Cardiologist (physician) and Advanced Practice Providers (APPs -  Physician Assistants and Nurse Practitioners) who all work together to provide you with the care you need, when you need it. You will need a follow up appointment in 1 years.  Please call our office 2 months in advance to schedule this appointment.  You may see Jenkins Rouge, MD or one of the following Advanced Practice Providers on your designated Care Team:   Truitt Merle, NP Cecilie Kicks, NP . Kathyrn Drown, NP

## 2018-01-02 ENCOUNTER — Ambulatory Visit (INDEPENDENT_AMBULATORY_CARE_PROVIDER_SITE_OTHER): Payer: Medicare HMO | Admitting: Family

## 2018-01-02 ENCOUNTER — Encounter: Payer: Self-pay | Admitting: Family

## 2018-01-02 ENCOUNTER — Other Ambulatory Visit (INDEPENDENT_AMBULATORY_CARE_PROVIDER_SITE_OTHER): Payer: Medicare HMO

## 2018-01-02 VITALS — BP 132/70 | HR 71 | Temp 97.8°F | Ht 70.0 in | Wt 148.1 lb

## 2018-01-02 DIAGNOSIS — R42 Dizziness and giddiness: Secondary | ICD-10-CM

## 2018-01-02 DIAGNOSIS — H6983 Other specified disorders of Eustachian tube, bilateral: Secondary | ICD-10-CM | POA: Diagnosis not present

## 2018-01-02 LAB — CBC WITH DIFFERENTIAL/PLATELET
Basophils Absolute: 0 10*3/uL (ref 0.0–0.1)
Basophils Relative: 0.4 % (ref 0.0–3.0)
Eosinophils Absolute: 0.1 10*3/uL (ref 0.0–0.7)
Eosinophils Relative: 0.6 % (ref 0.0–5.0)
HEMATOCRIT: 36.5 % (ref 36.0–46.0)
HEMOGLOBIN: 12.2 g/dL (ref 12.0–15.0)
LYMPHS ABS: 2.8 10*3/uL (ref 0.7–4.0)
Lymphocytes Relative: 29.4 % (ref 12.0–46.0)
MCHC: 33.5 g/dL (ref 30.0–36.0)
MCV: 91.8 fl (ref 78.0–100.0)
MONO ABS: 0.5 10*3/uL (ref 0.1–1.0)
Monocytes Relative: 5.7 % (ref 3.0–12.0)
NEUTROS ABS: 6.1 10*3/uL (ref 1.4–7.7)
Neutrophils Relative %: 63.9 % (ref 43.0–77.0)
Platelets: 267 10*3/uL (ref 150.0–400.0)
RBC: 3.98 Mil/uL (ref 3.87–5.11)
RDW: 12.5 % (ref 11.5–15.5)
WBC: 9.5 10*3/uL (ref 4.0–10.5)

## 2018-01-02 LAB — COMPREHENSIVE METABOLIC PANEL
ALBUMIN: 4.4 g/dL (ref 3.5–5.2)
ALK PHOS: 58 U/L (ref 39–117)
ALT: 22 U/L (ref 0–35)
AST: 25 U/L (ref 0–37)
BILIRUBIN TOTAL: 0.3 mg/dL (ref 0.2–1.2)
BUN: 17 mg/dL (ref 6–23)
CALCIUM: 9.5 mg/dL (ref 8.4–10.5)
CO2: 30 mEq/L (ref 19–32)
Chloride: 102 mEq/L (ref 96–112)
Creatinine, Ser: 0.96 mg/dL (ref 0.40–1.20)
GFR: 60.97 mL/min (ref >60.00)
Glucose, Bld: 98 mg/dL (ref 70–99)
Potassium: 4.1 mEq/L (ref 3.5–5.1)
Sodium: 139 mEq/L (ref 135–145)
TOTAL PROTEIN: 7.2 g/dL (ref 6.0–8.3)

## 2018-01-02 LAB — TSH: TSH: 1.46 u[IU]/mL (ref 0.35–4.50)

## 2018-01-02 MED ORDER — FLUTICASONE PROPIONATE 50 MCG/ACT NA SUSP: 2.0000 | Freq: Every day | NASAL | 3 refills | Status: DC | PRN

## 2018-01-02 NOTE — Patient Instructions (Signed)
Eustachian Tube Dysfunction The eustachian tube connects the middle ear to the back of the nose. It regulates air pressure in the middle ear by allowing air to move between the ear and nose. It also helps to drain fluid from the middle ear space. When the eustachian tube does not function properly, air pressure, fluid, or both can build up in the middle ear. Eustachian tube dysfunction can affect one or both ears. What are the causes? This condition happens when the eustachian tube becomes blocked or cannot open normally. This may result from:  Ear infections.  Colds and other upper respiratory infections.  Allergies.  Irritation, such as from cigarette smoke or acid from the stomach coming up into the esophagus (gastroesophageal reflux).  Sudden changes in air pressure, such as from descending in an airplane.  Abnormal growths in the nose or throat, such as nasal polyps, tumors, or enlarged tissue at the back of the throat (adenoids).  What increases the risk? This condition may be more likely to develop in people who smoke and people who are overweight. Eustachian tube dysfunction may also be more likely to develop in children, especially children who have:  Certain birth defects of the mouth, such as cleft palate.  Large tonsils and adenoids.  What are the signs or symptoms? Symptoms of this condition may include:  A feeling of fullness in the ear.  Ear pain.  Clicking or popping noises in the ear.  Ringing in the ear.  Hearing loss.  Loss of balance.  Symptoms may get worse when the air pressure around you changes, such as when you travel to an area of high elevation or fly on an airplane. How is this diagnosed? This condition may be diagnosed based on:  Your symptoms.  A physical exam of your ear, nose, and throat.  Tests, such as those that measure: ? The movement of your eardrum (tympanogram). ? Your hearing (audiometry).  How is this treated? Treatment  depends on the cause and severity of your condition. If your symptoms are mild, you may be able to relieve your symptoms by moving air into ("popping") your ears. If you have symptoms of fluid in your ears, treatment may include:  Decongestants.  Antihistamines.  Nasal sprays or ear drops that contain medicines that reduce swelling (steroids).  In some cases, you may need to have a procedure to drain the fluid in your eardrum (myringotomy). In this procedure, a small tube is placed in the eardrum to:  Drain the fluid.  Restore the air in the middle ear space.  Follow these instructions at home:  Take over-the-counter and prescription medicines only as told by your health care provider.  Use techniques to help pop your ears as recommended by your health care provider. These may include: ? Chewing gum. ? Yawning. ? Frequent, forceful swallowing. ? Closing your mouth, holding your nose closed, and gently blowing as if you are trying to blow air out of your nose.  Do not do any of the following until your health care provider approves: ? Travel to high altitudes. ? Fly in airplanes. ? Work in a pressurized cabin or room. ? Scuba dive.  Keep your ears dry. Dry your ears completely after showering or bathing.  Do not smoke.  Keep all follow-up visits as told by your health care provider. This is important. Contact a health care provider if:  Your symptoms do not go away after treatment.  Your symptoms come back after treatment.  You are   unable to pop your ears.  You have: ? A fever. ? Pain in your ear. ? Pain in your head or neck. ? Fluid draining from your ear.  Your hearing suddenly changes.  You become very dizzy.  You lose your balance. This information is not intended to replace advice given to you by your health care provider. Make sure you discuss any questions you have with your health care provider. Document Released: 02/24/2015 Document Revised: 07/06/2015  Document Reviewed: 02/16/2014 Elsevier Interactive Patient Education  2018 Elsevier Inc.  

## 2018-01-02 NOTE — Progress Notes (Signed)
Teresa Booker is a 72 y.o. female with the following history as recorded in EpicCare:  Patient Active Problem List   Diagnosis Date Noted  . Hyperlipidemia 08/31/2014  . Elevated BP 11/18/2012  . Abdominal pain, RUQ 08/14/2010  . ABDOMINAL BRUIT 06/27/2008  . RIGHT BUNDLE BRANCH BLOCK 06/24/2008  . Allergic rhinitis 09/09/2007  . IBS 08/13/2007  . ROTATOR CUFF SYNDROME, RIGHT 08/13/2007  . Elevated lipids 06/24/2007    Current Outpatient Medications  Medication Sig Dispense Refill  . aspirin 81 MG tablet Take 81 mg by mouth daily.    . Calcium 200 MG TABS Take 1 tablet by mouth daily.     . Chromium 1000 MCG TABS Take 1 tablet by mouth daily.    . diphenhydramine-acetaminophen (TYLENOL PM) 25-500 MG TABS tablet Take 1 tablet by mouth at bedtime as needed (for sleep).    . Echinacea 125 MG CAPS Take 1 capsule by mouth daily.     . fluticasone (FLONASE) 50 MCG/ACT nasal spray Place 2 sprays into both nostrils daily as needed for allergies or rhinitis. 16 g 3  . hydrOXYzine (ATARAX/VISTARIL) 25 MG tablet Take 1 tablet (25 mg total) by mouth every 8 (eight) hours as needed. 30 tablet 11  . hyoscyamine (LEVBID) 0.375 MG 12 hr tablet Take 1 tablet (0.375 mg total) by mouth 2 (two) times daily. 180 tablet 1  . L-Lysine 500 MG TABS Take 500 mg by mouth daily.    Marland Kitchen loratadine (CLARITIN) 10 MG tablet Take 1 tablet (10 mg total) by mouth daily. 90 tablet 3  . Magnesium 250 MG TABS Take 250 mg by mouth daily.    . metoprolol succinate (TOPROL XL) 25 MG 24 hr tablet Take 1 tablet (25 mg total) by mouth daily. 90 tablet 3  . Multiple Vitamin (MULTIVITAMIN) capsule Take 1 capsule by mouth daily.      . Multiple Vitamins-Minerals (OCUVITE PRESERVISION PO) Take 1 tablet by mouth daily.    Marland Kitchen omeprazole (PRILOSEC) 20 MG capsule Take 20 mg by mouth daily.      . predniSONE (DELTASONE) 20 MG tablet Take 2 tablets daily for 5 days. 10 tablet 1  . rosuvastatin (CRESTOR) 5 MG tablet TAKE 1 TABLET BY MOUTH AT  BEDTIME 30 tablet 6   No current facility-administered medications for this visit.     Allergies: Codeine; Penicillins; Sulfonamide derivatives; and Aspirin  Past Medical History:  Diagnosis Date  . Alcohol abuse    - abstemious 20 years. Attends AA - strong recovery   . Allergic reaction   . Allergy    seasonal  . Anxiety   . Fibromyalgia   . GERD (gastroesophageal reflux disease)   . Hyperlipemia   . IBS (irritable bowel syndrome)   . Postnasal drip   . RBBB (right bundle branch block)   . Rotator cuff syndrome    right    Past Surgical History:  Procedure Laterality Date  . COLONOSCOPY    . MOUTH SURGERY  2002  . ROTATOR CUFF REPAIR  10-24-05   had bone spur and laceration off cuff. Daldorf    Family History  Problem Relation Age of Onset  . Hyperlipidemia Mother   . Irritable bowel syndrome Mother   . Coronary artery disease Mother   . Heart attack Mother   . Aneurysm Mother        brain  . Diabetes Brother   . Cancer Brother        small cell lung ca  with mets/ smoker  . Stroke Unknown   . Aneurysm Unknown   . Lung cancer Unknown   . Colon cancer Neg Hx     Social History   Tobacco Use  . Smoking status: Former Research scientist (life sciences)  . Smokeless tobacco: Never Used  . Tobacco comment: quit appox 10 yrs ago  Substance Use Topics  . Alcohol use: No    Alcohol/week: 0.0 standard drinks    Comment: former usuer AA 20+yrs ago strong recovery    Subjective:  Patient presents with concerns for headache/ runny nose x 1 month; + sinus pressure; +dizziness on and off for the past week; notes that she just has felt off balance; has used Neti-Pot with some relief today; does have prescription for Flonase on her medication list but not currently taking; no vision changes; no numbness/ tingling in extremities; has been on Toprol- recently lowered dosage to 12.5 mg; no chest pain, nausea or shortness of breath;    Objective:  Vitals:   01/02/18 1339  BP: 132/70  Pulse: 71   Temp: 97.8 F (36.6 C)  TempSrc: Oral  SpO2: 96%  Weight: 148 lb 1.3 oz (67.2 kg)  Height: '5\' 10"'$  (1.778 m)    General: Well developed, well nourished, in no acute distress  Skin : Warm and dry.  Head: Normocephalic and atraumatic  Eyes: Sclera and conjunctiva clear; pupils round and reactive to light; extraocular movements intact  Ears: External normal; canals clear; tympanic membranes normal  Oropharynx: Pink, supple. No suspicious lesions  Neck: Supple without thyromegaly, adenopathy  Lungs: Respirations unlabored; clear to auscultation bilaterally without wheeze, rales, rhonchi  CVS exam: normal rate and regular rhythm.  Neurologic: Alert and oriented; speech intact; face symmetrical; moves all extremities well; CNII-XII intact without focal deficit   Assessment:  1. Dysfunction of both eustachian tubes   2. Dizziness     Plan:  Reassurance- do not feel antibiotic is warranted; continue Claritin and Hydroxyzine; recommend for her to re-start her Flonase; will update labs today including CBC, CMP, TSH; follow-up to be determined.  No follow-ups on file.  Orders Placed This Encounter  Procedures  . CBC w/Diff    Standing Status:   Future    Number of Occurrences:   1    Standing Expiration Date:   01/02/2019  . Comp Met (CMET)    Standing Status:   Future    Number of Occurrences:   1    Standing Expiration Date:   01/02/2019  . TSH    Standing Status:   Future    Number of Occurrences:   1    Standing Expiration Date:   01/02/2019    Requested Prescriptions   Signed Prescriptions Disp Refills  . fluticasone (FLONASE) 50 MCG/ACT nasal spray 16 g 3    Sig: Place 2 sprays into both nostrils daily as needed for allergies or rhinitis.

## 2018-03-03 DIAGNOSIS — R69 Illness, unspecified: Secondary | ICD-10-CM | POA: Diagnosis not present

## 2018-03-12 ENCOUNTER — Encounter: Payer: Self-pay | Admitting: Obstetrics & Gynecology

## 2018-03-12 ENCOUNTER — Ambulatory Visit (INDEPENDENT_AMBULATORY_CARE_PROVIDER_SITE_OTHER): Payer: Medicare HMO | Admitting: Obstetrics & Gynecology

## 2018-03-12 VITALS — BP 150/80 | Ht 65.75 in | Wt 148.0 lb

## 2018-03-12 DIAGNOSIS — Z13820 Encounter for screening for osteoporosis: Secondary | ICD-10-CM

## 2018-03-12 DIAGNOSIS — Z780 Asymptomatic menopausal state: Secondary | ICD-10-CM

## 2018-03-12 DIAGNOSIS — Z01419 Encounter for gynecological examination (general) (routine) without abnormal findings: Secondary | ICD-10-CM

## 2018-03-12 NOTE — Progress Notes (Signed)
Teresa Booker [birth year] [id number]   History:    73 y.o. G0 Single.  Same sex partner.  RP:  Established patient presenting for annual gyn exam   HPI:  Menopause, well on no HRT.  No PMB.  No pelvic pain.  Abstinent x 2 years.  Breasts normal.  Urine/BMs normal.  BMI 24.07.  Swimmer.  Healthy nutrition.  Health Labs normal 2019.  Colono 2016.  Past medical history,surgical history, family history and social history were all reviewed and documented in the EPIC chart.  Gynecologic History No LMP recorded. Patient is postmenopausal. Contraception: abstinence and post menopausal status Last Pap: 2018. Results were: normal Last mammogram: 09/2017. Results were: Negative Bone Density: 2011 Colonoscopy: 2016  Obstetric History OB History  Gravida Para Term Preterm AB Living  0 0 0 0 0 0  SAB TAB Ectopic Multiple Live Births  0 0 0 0 0     ROS: A ROS was performed and pertinent positives and negatives are included in the history.  GENERAL: No fevers or chills. HEENT: No change in vision, no earache, sore throat or sinus congestion. NECK: No pain or stiffness. CARDIOVASCULAR: No chest pain or pressure. No palpitations. PULMONARY: No shortness of breath, cough or wheeze. GASTROINTESTINAL: No abdominal pain, nausea, vomiting or diarrhea, melena or bright red blood per rectum. GENITOURINARY: No urinary frequency, urgency, hesitancy or dysuria. MUSCULOSKELETAL: No joint or muscle pain, no back pain, no recent trauma. DERMATOLOGIC: No rash, no itching, no lesions. ENDOCRINE: No polyuria, polydipsia, no heat or cold intolerance. No recent change in weight. HEMATOLOGICAL: No anemia or easy bruising or bleeding. NEUROLOGIC: No headache, seizures, numbness, tingling or weakness. PSYCHIATRIC: No depression, no loss of interest in normal activity or change in sleep pattern.     Exam:   BP (!) 150/80   Ht 5' 5.75" (1.67 m)   Wt 148 lb (67.1 kg)   BMI 24.07 kg/m   Body mass index is 24.07  kg/m.  General appearance : Well developed well nourished female. No acute distress HEENT: Eyes: no retinal hemorrhage or exudates,  Neck supple, trachea midline, no carotid bruits, no thyroidmegaly Lungs: Clear to auscultation, no rhonchi or wheezes, or rib retractions  Heart: Regular rate and rhythm, no murmurs or gallops Breast:Examined in sitting and supine position were symmetrical in appearance, no palpable masses or tenderness,  no skin retraction, no nipple inversion, no nipple discharge, no skin discoloration, no axillary or supraclavicular lymphadenopathy Abdomen: no palpable masses or tenderness, no rebound or guarding Extremities: no edema or skin discoloration or tenderness  Pelvic: Vulva: Normal             Vagina: No gross lesions or discharge  Cervix: No gross lesions or discharge  Uterus  AV, normal size, shape and consistency, non-tender and mobile  Adnexa  Without masses or tenderness  Anus: Normal   Assessment/Plan:  74 y.o. female for annual exam   1. Well female exam with routine gynecological exam Normal gynecologic exam and menopause.  Pap test normal in 2018.  Will repeat at 3 years.  Breast exam normal.  Screening mammogram in August 2019 was negative.  Colonoscopy in 2016.  Health labs with family physician.  2. Postmenopausal Well on no hormone replacement therapy.  No postmenopausal bleeding.  3. Screening for osteoporosis No recent bone density.  Will schedule a bone density here now.  Vitamin D supplements, calcium intake of 1.2 to 1.5 g/day including nutritional and supplemental, weightbearing physical activities on  a regular basis. - DG Bone Density; Future  Princess Bruins MD, 10:53 AM 03/12/2018

## 2018-03-18 ENCOUNTER — Ambulatory Visit (INDEPENDENT_AMBULATORY_CARE_PROVIDER_SITE_OTHER): Payer: Medicare HMO

## 2018-03-18 DIAGNOSIS — Z780 Asymptomatic menopausal state: Secondary | ICD-10-CM

## 2018-03-18 DIAGNOSIS — Z13820 Encounter for screening for osteoporosis: Secondary | ICD-10-CM

## 2018-03-19 ENCOUNTER — Other Ambulatory Visit: Payer: Self-pay | Admitting: Obstetrics & Gynecology

## 2018-03-19 ENCOUNTER — Encounter: Payer: Self-pay | Admitting: Obstetrics & Gynecology

## 2018-03-19 DIAGNOSIS — Z780 Asymptomatic menopausal state: Secondary | ICD-10-CM

## 2018-03-19 NOTE — Patient Instructions (Signed)
1. Well female exam with routine gynecological exam Normal gynecologic exam and menopause.  Pap test normal in 2018.  Will repeat at 3 years.  Breast exam normal.  Screening mammogram in August 2019 was negative.  Colonoscopy in 2016.  Health labs with family physician.  2. Postmenopausal Well on no hormone replacement therapy.  No postmenopausal bleeding.  3. Screening for osteoporosis No recent bone density.  Will schedule a bone density here now.  Vitamin D supplements, calcium intake of 1.2 to 1.5 g/day including nutritional and supplemental, weightbearing physical activities on a regular basis. - DG Bone Density; Future  Teresa Booker, it was a pleasure seeing you today!  I will inform you of your bone density results when available.

## 2018-04-08 ENCOUNTER — Other Ambulatory Visit: Payer: Medicare HMO | Admitting: *Deleted

## 2018-04-08 DIAGNOSIS — E785 Hyperlipidemia, unspecified: Secondary | ICD-10-CM | POA: Diagnosis not present

## 2018-04-08 DIAGNOSIS — I4510 Unspecified right bundle-branch block: Secondary | ICD-10-CM

## 2018-04-08 LAB — LIPID PANEL
Chol/HDL Ratio: 3.2 ratio (ref 0.0–4.4)
Cholesterol, Total: 168 mg/dL (ref 100–199)
HDL: 52 mg/dL (ref >39)
LDL Calculated: 86 mg/dL (ref 0–99)
Triglycerides: 148 mg/dL (ref 0–149)
VLDL Cholesterol Cal: 30 mg/dL (ref 5–40)

## 2018-04-08 LAB — HEPATIC FUNCTION PANEL
ALT: 28 IU/L (ref 0–32)
AST: 28 IU/L (ref 0–40)
Albumin: 4.5 g/dL (ref 3.8–4.8)
Alkaline Phosphatase: 63 IU/L (ref 39–117)
Bilirubin Total: 0.3 mg/dL (ref 0.0–1.2)
Bilirubin, Direct: 0.1 mg/dL (ref 0.00–0.40)
Total Protein: 6.7 g/dL (ref 6.0–8.5)

## 2018-04-08 LAB — TSH: TSH: 2.91 u[IU]/mL (ref 0.450–4.500)

## 2018-05-12 ENCOUNTER — Other Ambulatory Visit: Payer: Self-pay | Admitting: Cardiovascular Disease

## 2018-05-12 MED ORDER — ROSUVASTATIN CALCIUM 5 MG PO TABS: 5.0000 mg | ORAL_TABLET | Freq: Every day | ORAL | 2 refills | Status: DC

## 2018-05-12 NOTE — Telephone Encounter (Signed)
Pt's medication was sent to pt's pharmacy as requested. Confirmation received.  °

## 2018-07-01 DIAGNOSIS — R69 Illness, unspecified: Secondary | ICD-10-CM | POA: Diagnosis not present

## 2018-07-03 ENCOUNTER — Other Ambulatory Visit: Payer: Self-pay | Admitting: Cardiovascular Disease

## 2018-07-23 DIAGNOSIS — R69 Illness, unspecified: Secondary | ICD-10-CM | POA: Diagnosis not present

## 2018-08-12 ENCOUNTER — Other Ambulatory Visit: Payer: Self-pay | Admitting: Obstetrics & Gynecology

## 2018-08-12 DIAGNOSIS — Z1231 Encounter for screening mammogram for malignant neoplasm of breast: Secondary | ICD-10-CM

## 2018-08-21 ENCOUNTER — Other Ambulatory Visit: Payer: Self-pay | Admitting: Family Medicine

## 2018-08-24 ENCOUNTER — Telehealth: Payer: Self-pay

## 2018-08-24 NOTE — Progress Notes (Signed)
Cardiology Office Note    Date:  08/25/2018   ID:  Teresa Booker, DOB 05-13-1945, MRN [id number]  PCP:  [name], MD  Cardiologist: Jenkins Rouge, MD EPS: None  Chief Complaint  Patient presents with  . Palpitations    History of Present Illness:  Teresa Booker is a 73 y.o. female , partner of [name], CMA in our office, with history of hyperlipidemia on statin, palpitations with Holter 04/18/2017 showing PVCs treated with Toprol, normal NST 05/06/2017 LVEF 70% echo 04/2017 LVEF 60 to 65% with anterior leaflet prolapse no significant MR  Patient was added onto my schedule today because of skipped heartbeats associated with some dizziness.  She has had PVCs on a monitor in the past and was placed on Toprol XL 50 mg but only takes 25 mg daily because heart rate tends to run in the 50s BP is usually on the lower side.  She takes an extra 25 mg as needed.  Patient says she got very overheated working in the yard July 4th. The next day her heart started skipping every 3 beats-needs to take a deep breath. Yesterday and today felt a little off balance. Some stomach ache/spasm but has had in the past. Took an extra toprol for 4 days. Hasn't been able to swim. Was drinking 3 cups coffee/day. She also took a prednisone for a bee sting the day before all this started. It makes her anxious.  Past Medical History:  Diagnosis Date  . Alcohol abuse    - abstemious 20 years. Attends AA - strong recovery   . Allergic reaction   . Allergy    seasonal  . Anxiety   . Fibromyalgia   . GERD (gastroesophageal reflux disease)   . Hyperlipemia   . IBS (irritable bowel syndrome)   . Postnasal drip   . RBBB (right bundle branch block)   . Rotator cuff syndrome    right    Past Surgical History:  Procedure Laterality Date  . COLONOSCOPY    . MOUTH SURGERY  2002  . ROTATOR CUFF REPAIR  10-24-05   had bone spur and laceration off cuff. Daldorf    Current Medications: Current Meds   Medication Sig  . aspirin 81 MG tablet Take 81 mg by mouth daily.  . Calcium 200 MG TABS Take 1 tablet by mouth daily.   . Chromium 1000 MCG TABS Take 1 tablet by mouth daily.  . diphenhydramine-acetaminophen (TYLENOL PM) 25-500 MG TABS tablet Take 1 tablet by mouth at bedtime as needed (for sleep).  . Echinacea 125 MG CAPS Take 1 capsule by mouth daily.   . hydrOXYzine (ATARAX/VISTARIL) 25 MG tablet Take 1 tablet (25 mg total) by mouth every 8 (eight) hours as needed.  . hyoscyamine (LEVBID) 0.375 MG 12 hr tablet Take 1 tablet (0.375 mg total) by mouth 2 (two) times daily.  Marland Kitchen L-Lysine 500 MG TABS Take 500 mg by mouth daily.  Marland Kitchen loratadine (CLARITIN) 10 MG tablet Take 1 tablet (10 mg total) by mouth daily.  . Magnesium 250 MG TABS Take 250 mg by mouth daily.  . Multiple Vitamin (MULTIVITAMIN) capsule Take 1 capsule by mouth daily.    Marland Kitchen omeprazole (PRILOSEC) 20 MG capsule Take 20 mg by mouth daily.    . predniSONE (DELTASONE) 20 MG tablet Take 2 tablets daily for 5 days.  . rosuvastatin (CRESTOR) 5 MG tablet Take 1 tablet (5 mg total) by mouth at bedtime.  . [DISCONTINUED] metoprolol succinate (  TOPROL-XL) 25 MG 24 hr tablet Take 1 tablet by mouth once daily     Allergies:   Codeine, Penicillins, Sulfonamide derivatives, and Aspirin   Social History   Socioeconomic History  . Marital status: Single    Spouse name: Not on file  . Number of children: Not on file  . Years of education: 41  . Highest education level: Not on file  Occupational History  . 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 Financial resource strain: Not on file  . Food insecurity    Worry: Not on file    Inability: Not on file  . Transportation needs    Medical: Not on file    Non-medical: Not on file  Tobacco Use  . Smoking status: Former Smoker    Quit date: 03/12/1996    Years since quitting: 22.4  . Smokeless tobacco: Never Used  . Tobacco comment: quit appox 10 yrs ago  Substance and  Sexual Activity  . Alcohol use: No    Alcohol/week: 0.0 standard drinks    Comment: former [employer]uer AA 20+yrs ago strong recovery  . Drug use: No  . Sexual activity: Yes    Partners: Female  Lifestyle  . Physical activity    Days per week: Not on file    Minutes per session: Not on file  . Stress: Not on file  Relationships  . Social Herbalist on phone: Not on file    Gets together: Not on file    Attends religious service: Not on file    Active member of club or organization: Not on file    Attends meetings of clubs or organizations: Not on file    Relationship status: Not on file  Other Topics Concern  . Not on file  Social History Narrative   [employer]   Long-term relationship - '95    Currently attending classes in medical coding (May '12)     Family History:  The patient's   family history includes Aneurysm in her mother and another family member; Cancer in her brother; Coronary artery disease in her mother; Diabetes in her brother; Heart attack in her mother; Hyperlipidemia in her mother; Irritable bowel syndrome in her mother; Lung cancer in an other family member; Stroke in an other family member.   ROS:   Please see the history of present illness.    ROS All other systems reviewed and are negative.   PHYSICAL EXAM:   VS:  BP (!) 148/60   Pulse 76   Ht 5' 5.75" (1.67 m)   Wt 144 lb 12.8 oz (65.7 kg)   SpO2 97%   BMI 23.55 kg/m   Physical Exam  GEN: Thin, in no acute distress  Neck: no JVD, carotid bruits, or masses Cardiac:RRR; skipping no murmurs, rubs, or gallops  Respiratory:  clear to auscultation bilaterally, normal work of breathing GI: soft, nontender, nondistended, + BS Ext: without cyanosis, clubbing, or edema, Good distal pulses bilaterally Neuro:  Alert and Oriented x 3 Psych: euthymic mood, full affect  Wt Readings from Last 3 Encounters:  08/25/18 144 lb 12.8 oz (65.7 kg)  03/12/18 148 lb (67.1 kg)  01/02/18 148 lb 1.3 oz  (67.2 kg)      Studies/Labs Reviewed:   EKG:  EKG is ordered today.  The ekg ordered today demonstrates NSR with frequent PVC's  Recent Labs: 01/02/2018: BUN 17; Creatinine, Ser 0.96; Hemoglobin 12.2; Platelets 267.0; Potassium 4.1; Sodium  139 04/08/2018: ALT 28; TSH 2.910   Lipid Panel    Component Value Date/Time   CHOL 168 04/08/2018 0835   TRIG 148 04/08/2018 0835   HDL 52 04/08/2018 0835   CHOLHDL 3.2 04/08/2018 0835   CHOLHDL 2.7 10/05/2015 0758   VLDL 28 10/05/2015 0758   LDLCALC 86 04/08/2018 0835    Additional studies/ records that were reviewed today include:  NST 05/06/2017   Nuclear stress EF: 70%.  There was no ST segment deviation noted during stress.  Blood pressure demonstrated a hypertensive response to exercise.  The study is normal.  This is a low risk study.  The left ventricular ejection fraction is hyperdynamic (>65%).   Normal exercise nuclear stress test with no evidence for prior infarct or ischemia.  Normal LVEF. Poor exercise capacity. Hypertensive response to exercise.   2D echo 3/26/2019Study Conclusions   - Left ventricle: The cavity size was normal. Systolic function was   normal. The estimated ejection fraction was in the range of 60%   to 65%. Wall motion was normal; there were no regional wall   motion abnormalities. Left ventricular diastolic function   parameters were normal. - Mitral valve: Mild, late systolicprolapse, involving the anterior   leaflet. There was no significant regurgitation. - Pulmonary arteries: PA peak pressure: 32 mm Hg (S).   Holter monitor 04/2017 Sinus rhythm PAC;s/ PVC;s No significant arrhythmias       ASSESSMENT:    1. Palpitation   2. PVC (premature ventricular contraction)   3. Medication management   4. Hyperlipidemia, unspecified hyperlipidemia type      PLAN:  In order of problems listed above:   Palpitations PVCs and PACs on Holter monitor 04/2017 treated with  beta-blocker-recurrent PVC's after becoming overheated and taking prednisone 20 mg x 1 for a bee sting. Was also drinking 3 cups coffee daily. Will increase toprol XL to 50 mg daily until it settles down, check cmet, mg, cbc, tsh. If doesn't improve place holter monitor. Decrease caffeine.  Hyperlipidemia on Crestor LDL 86 03/2018   Medication Adjustments/Labs and Tests Ordered: Current medicines are reviewed at length with the patient today.  Concerns regarding medicines are outlined above.  Medication changes, Labs and Tests ordered today are listed in the Patient Instructions below. Patient Instructions  Medication Instructions:   Your physician has recommended you make the following change in your medication:   Increase Metoprolol to 50MG , 1 tablet once a day  If you need a refill on your cardiac medications before your next appointment, please call your pharmacy.   Lab work:  You will have labs drawn today: CMET, Magnesium, CBC, and TSH  If you have labs (blood work) drawn today and your tests are completely normal, you will receive your results only by: Marland Kitchen MyChart Message (if you have MyChart) OR . A paper copy in the mail If you have any lab test that is abnormal or we need to change your treatment, we will call you to review the results.  Testing/Procedures:  None ordered today  Follow-Up: At [facility], you and your health needs are our priority.  As part of our continuing mission to provide you with exceptional heart care, we have created designated Provider Care Teams.  These Care Teams include your primary Cardiologist (physician) and Advanced Practice Providers (APPs -  Physician Assistants and Nurse Practitioners) who all work together to provide you with the care you need, when you need it. You will need a follow up appointment  in 6 months.  Please call our office 2 months in advance to schedule this appointment.  You may see Jenkins Rouge, MD or one of the following  Advanced Practice Providers on your designated Care Team:   Truitt Merle, NP Cecilie Kicks, NP . Kathyrn Drown, NP  Any Other Special Instructions Will Be Listed Below (If Applicable).  Call our office before 6 month follow up if you are not feeling better after increasing Metoprolol.      Sumner Boast, PA-C  08/25/2018 10:42 AM    [facility] [address], [place], [place]  [address] Phone: [phone]; Fax: [phone]

## 2018-08-24 NOTE — Telephone Encounter (Signed)
Call received from patient's partner Arbie Cookey (DPR on file). She states that the patient has had frequent episodes of "skipped beats". She states that it happens every 3rd beat. She states that they have been very bothersome for the patient and that she has been having some accompanying lightheadedness and dizziness. She states that the patient had a monitor 1 year ago that showed PVCs. She was instructed to take Toprol 50 mg QD at that time. She states that since her HR is around 50 bpm and her BP is usually on the lower side (no reading available) so she only takes 25 mg QD and will take the extra 25 mg PRN. She also states that the patient has white coat and her BP is usually higher in the office. She states that the patient does not drink alcohol and has not started any new medicines. She states that the patient drinks about 1 cup of coffee a day but has had her limiting that and increasing her water intake to see if that would help. She would like to schedule an appointment for the patient to be evaluated. Appointment made with Ermalinda Barrios, PA on 7/14 at 10:00 AM. Per Arbie Cookey the patient answers no to all screening questions below. She understands that there are no visitors allowed. She understands not to arrive more than 15 minutes prior to the scheduled appointment time and that a mask will be required for the visit.      COVID-19 Pre-Screening Questions:  . In the past 7 to 10 days have you had a cough,  shortness of breath, headache, congestion, fever (100 or greater) body aches, chills, sore throat, or sudden loss of taste or sense of smell? NO . Have you been around anyone with known Covid 19? NO . Have you been around anyone who is awaiting Covid 19 test results in the past 7 to 10 days? NO . Have you been around anyone who has been exposed to Covid 19, or has mentioned symptoms of Covid 19 within the past 7 to 10 days? NO

## 2018-08-25 ENCOUNTER — Encounter: Payer: Self-pay | Admitting: Physician Assistant

## 2018-08-25 ENCOUNTER — Other Ambulatory Visit: Payer: Self-pay

## 2018-08-25 ENCOUNTER — Ambulatory Visit: Payer: Medicare HMO | Admitting: Physician Assistant

## 2018-08-25 VITALS — BP 148/60 | HR 76 | Ht 65.75 in | Wt 144.8 lb

## 2018-08-25 DIAGNOSIS — R002 Palpitations: Secondary | ICD-10-CM | POA: Diagnosis not present

## 2018-08-25 DIAGNOSIS — Z79899 Other long term (current) drug therapy: Secondary | ICD-10-CM | POA: Diagnosis not present

## 2018-08-25 DIAGNOSIS — E785 Hyperlipidemia, unspecified: Secondary | ICD-10-CM | POA: Diagnosis not present

## 2018-08-25 DIAGNOSIS — I493 Ventricular premature depolarization: Secondary | ICD-10-CM

## 2018-08-25 LAB — CBC
Hematocrit: 37.8 % (ref 34.0–46.6)
Hemoglobin: 12.4 g/dL (ref 11.1–15.9)
MCH: 30.6 pg (ref 26.6–33.0)
MCHC: 32.8 g/dL (ref 31.5–35.7)
MCV: 93 fL (ref 79–97)
Platelets: 250 10*3/uL (ref 150–450)
RBC: 4.05 x10E6/uL (ref 3.77–5.28)
RDW: 12.3 % (ref 11.7–15.4)
WBC: 7.5 10*3/uL (ref 3.4–10.8)

## 2018-08-25 LAB — COMPREHENSIVE METABOLIC PANEL
ALT: 28 IU/L (ref 0–32)
AST: 27 IU/L (ref 0–40)
Albumin/Globulin Ratio: 2.3 — ABNORMAL HIGH (ref 1.2–2.2)
Albumin: 4.6 g/dL (ref 3.7–4.7)
Alkaline Phosphatase: 67 IU/L (ref 39–117)
BUN/Creatinine Ratio: 17 (ref 12–28)
BUN: 17 mg/dL (ref 8–27)
Bilirubin Total: 0.3 mg/dL (ref 0.0–1.2)
CO2: 25 mmol/L (ref 20–29)
Calcium: 9.8 mg/dL (ref 8.7–10.3)
Chloride: 103 mmol/L (ref 96–106)
Creatinine, Ser: 1.02 mg/dL — ABNORMAL HIGH (ref 0.57–1.00)
GFR calc Af Amer: 64 mL/min/{1.73_m2} (ref >59)
GFR calc non Af Amer: 55 mL/min/{1.73_m2} — ABNORMAL LOW (ref >59)
Globulin, Total: 2 g/dL (ref 1.5–4.5)
Glucose: 117 mg/dL — ABNORMAL HIGH (ref 65–99)
Potassium: 4.7 mmol/L (ref 3.5–5.2)
Sodium: 143 mmol/L (ref 134–144)
Total Protein: 6.6 g/dL (ref 6.0–8.5)

## 2018-08-25 LAB — TSH: TSH: 1.85 u[IU]/mL (ref 0.450–4.500)

## 2018-08-25 LAB — MAGNESIUM: Magnesium: 2.3 mg/dL (ref 1.6–2.3)

## 2018-08-25 MED ORDER — METOPROLOL SUCCINATE ER 50 MG PO TB24: 50.0000 mg | ORAL_TABLET | Freq: Every day | ORAL | 3 refills | Status: DC

## 2018-08-25 NOTE — Patient Instructions (Signed)
Medication Instructions:   Your physician has recommended you make the following change in your medication:   Increase Metoprolol to 50MG , 1 tablet once a day  If you need a refill on your cardiac medications before your next appointment, please call your pharmacy.   Lab work:  You will have labs drawn today: CMET, Magnesium, CBC, and TSH  If you have labs (blood work) drawn today and your tests are completely normal, you will receive your results only by: Marland Kitchen MyChart Message (if you have MyChart) OR . A paper copy in the mail If you have any lab test that is abnormal or we need to change your treatment, we will call you to review the results.  Testing/Procedures:  None ordered today  Follow-Up: At [facility], you and your health needs are our priority.  As part of our continuing mission to provide you with exceptional heart care, we have created designated Provider Care Teams.  These Care Teams include your primary Cardiologist (physician) and Advanced Practice Providers (APPs -  Physician Assistants and Nurse Practitioners) who all work together to provide you with the care you need, when you need it. You will need a follow up appointment in 6 months.  Please call our office 2 months in advance to schedule this appointment.  You may see Jenkins Rouge, MD or one of the following Advanced Practice Providers on your designated Care Team:   Truitt Merle, NP Cecilie Kicks, NP . Kathyrn Drown, NP  Any Other Special Instructions Will Be Listed Below (If Applicable).  Call our office before 6 month follow up if you are not feeling better after increasing Metoprolol.

## 2018-08-28 ENCOUNTER — Other Ambulatory Visit: Payer: Self-pay | Admitting: Family Medicine

## 2018-09-01 ENCOUNTER — Encounter: Payer: Self-pay | Admitting: Family Medicine

## 2018-09-02 ENCOUNTER — Other Ambulatory Visit: Payer: Self-pay

## 2018-09-02 MED ORDER — HYOSCYAMINE SULFATE ER 0.375 MG PO TB12: 0.3750 mg | ORAL_TABLET | Freq: Two times a day (BID) | ORAL | 0 refills | Status: DC

## 2018-09-02 MED ORDER — HYDROXYZINE HCL 25 MG PO TABS: 25.0000 mg | ORAL_TABLET | Freq: Three times a day (TID) | ORAL | 0 refills | Status: DC | PRN

## 2018-09-22 ENCOUNTER — Other Ambulatory Visit: Payer: Self-pay

## 2018-09-22 ENCOUNTER — Ambulatory Visit
Admission: RE | Admit: 2018-09-22 | Discharge: 2018-09-22 | Disposition: A | Discharge status: Home / Self Care | Payer: Medicare HMO | Source: Ambulatory Visit | Attending: Obstetrics & Gynecology | Admitting: Obstetrics & Gynecology

## 2018-09-22 DIAGNOSIS — Z1231 Encounter for screening mammogram for malignant neoplasm of breast: Secondary | ICD-10-CM

## 2018-11-10 DIAGNOSIS — R69 Illness, unspecified: Secondary | ICD-10-CM | POA: Diagnosis not present

## 2018-11-20 ENCOUNTER — Ambulatory Visit (INDEPENDENT_AMBULATORY_CARE_PROVIDER_SITE_OTHER): Payer: Medicare HMO | Admitting: Family Medicine

## 2018-11-20 ENCOUNTER — Encounter: Payer: Self-pay | Admitting: Family Medicine

## 2018-11-20 ENCOUNTER — Other Ambulatory Visit: Payer: Self-pay

## 2018-11-20 VITALS — BP 124/68 | HR 77 | Temp 97.3°F | Resp 16 | Ht 65.0 in | Wt 147.2 lb

## 2018-11-20 DIAGNOSIS — R739 Hyperglycemia, unspecified: Secondary | ICD-10-CM | POA: Diagnosis not present

## 2018-11-20 DIAGNOSIS — Z23 Encounter for immunization: Secondary | ICD-10-CM

## 2018-11-20 DIAGNOSIS — Z0000 Encounter for general adult medical examination without abnormal findings: Secondary | ICD-10-CM

## 2018-11-20 LAB — POCT GLYCOSYLATED HEMOGLOBIN (HGB A1C)
HbA1c POC (<> result, manual entry): 5.6 % (ref 4.0–5.6)
HbA1c, POC (controlled diabetic range): 5.6 % (ref 0.0–7.0)
HbA1c, POC (prediabetic range): 5.6 % — AB (ref 5.7–6.4)
Hemoglobin A1C: 5.6 % (ref 4.0–5.6)

## 2018-11-20 NOTE — Progress Notes (Signed)
Subjective:     Patient ID: Teresa Booker, female   DOB: 1945-04-08, 73 y.o.   MRN: [id number]  HPI Teresa Booker is seen for physical exam.  Generally fairly healthy.  She has had some IBS issues in the past and has history of hyperlipidemia and frequent palpitations.  She is on metoprolol which seems to be helping.  She is followed by cardiology.  She had some intermittent right upper quadrant pain which is usually improved with eating.  Previous ultrasound 2016 no gallstones.  No melena.  No consistent reflux symptoms.  She takes omeprazole daily  She had some recent labs through cardiology and had fasting glucose 117  Health maintenance reviewed -Had DEXA scan last February which was good -Mammogram in August -Colonoscopy due next year -Needs tetanus -Prior reported reaction with Pneumovax and declines further.  Also declines flu vaccine  Past Medical History:  Diagnosis Date  . Alcohol abuse    - abstemious 20 years. Attends AA - strong recovery   . Allergic reaction   . Allergy    seasonal  . Anxiety   . Fibromyalgia   . GERD (gastroesophageal reflux disease)   . Hyperlipemia   . IBS (irritable bowel syndrome)   . Postnasal drip   . RBBB (right bundle branch block)   . Rotator cuff syndrome    right   Past Surgical History:  Procedure Laterality Date  . COLONOSCOPY    . MOUTH SURGERY  2002  . ROTATOR CUFF REPAIR  10-24-05   had bone spur and laceration off cuff. Daldorf    reports that she quit smoking about 22 years ago. She has never used smokeless tobacco. She reports that she does not drink alcohol or use drugs. family history includes Aneurysm in her mother and another family member; Cancer in her brother; Coronary artery disease in her mother; Diabetes in her brother; Heart attack in her mother; Hyperlipidemia in her mother; Irritable bowel syndrome in her mother; Lung cancer in an other family member; Stroke in an other family member. Allergies  Allergen Reactions  .  Codeine     Makes heart race  . Penicillins     unknown  . Sulfonamide Derivatives     Has a rash with the use of silvadiene for burn  . Aspirin     Upset stomach, High dose only      Review of Systems  Constitutional: Negative for activity change, appetite change, fatigue, fever and unexpected weight change.  HENT: Negative for ear pain, hearing loss, sore throat and trouble swallowing.   Eyes: Negative for visual disturbance.  Respiratory: Negative for cough and shortness of breath.   Cardiovascular: Negative for chest pain and palpitations.  Gastrointestinal: Positive for abdominal pain. Negative for blood in stool, constipation and diarrhea.  Genitourinary: Negative for dysuria and hematuria.  Musculoskeletal: Negative for arthralgias, back pain and myalgias.  Skin: Negative for rash.  Neurological: Negative for dizziness, syncope and headaches.  Hematological: Negative for adenopathy.  Psychiatric/Behavioral: Negative for confusion and dysphoric mood.       Objective:   Physical Exam Constitutional:      Appearance: She is well-developed.  HENT:     Head: Normocephalic and atraumatic.  Eyes:     Pupils: Pupils are equal, round, and reactive to light.  Neck:     Musculoskeletal: Normal range of motion and neck supple.     Thyroid: No thyromegaly.     Vascular: No JVD.  Cardiovascular:  Rate and Rhythm: Normal rate and regular rhythm.     Heart sounds: Normal heart sounds. No gallop.   Pulmonary:     Effort: Pulmonary effort is normal. No respiratory distress.     Breath sounds: Normal breath sounds. No wheezing or rales.  Abdominal:     General: Bowel sounds are normal. There is no distension.     Palpations: Abdomen is soft. There is no mass.     Tenderness: There is no abdominal tenderness. There is no guarding or rebound.  Musculoskeletal: Normal range of motion.     Right lower leg: No edema.     Left lower leg: No edema.  Lymphadenopathy:      Cervical: No cervical adenopathy.  Skin:    Findings: No rash.  Neurological:     Mental Status: She is alert and oriented to person, place, and time.     Cranial Nerves: No cranial nerve deficit.  Psychiatric:        Behavior: Behavior normal.        Thought Content: Thought content normal.        Judgment: Judgment normal.        Assessment:     Physical exam.  Generally healthy 73 year old female.  She has had some intermittent right upper quadrant pains which are actually improved with eating which seems unlikely to represent gallstones.  No gallstones noted on prior imaging.  She does not have any red flags such as loss of appetite or unexpected weight loss    Plan:     -Tetanus booster given -Patient declined flu vaccine as above -A1c 5.6%.  Continue fairly low glycemic diet -Consider upper GI if she has any persistent right upper quadrant discomfort. -Continue low saturated fat diet  [name] MD [facility]

## 2018-11-20 NOTE — Patient Instructions (Signed)
Preventive Care 71 Years and Older, Female Preventive care refers to lifestyle choices and visits with your health care provider that can promote health and wellness. This includes:  A yearly physical exam. This is also called an annual well check.  Regular dental and eye exams.  Immunizations.  Screening for certain conditions.  Healthy lifestyle choices, such as diet and exercise. What can I expect for my preventive care visit? Physical exam Your health care provider will check:  Height and weight. These may be used to calculate body mass index (BMI), which is a measurement that tells if you are at a healthy weight.  Heart rate and blood pressure.  Your skin for abnormal spots. Counseling Your health care provider may ask you questions about:  Alcohol, tobacco, and drug use.  Emotional well-being.  Home and relationship well-being.  Sexual activity.  Eating habits.  History of falls.  Memory and ability to understand (cognition).  Work and work Statistician.  Pregnancy and menstrual history. What immunizations do I need?  Influenza (flu) vaccine  This is recommended every year. Tetanus, diphtheria, and pertussis (Tdap) vaccine  You may need a Td booster every 10 years. Varicella (chickenpox) vaccine  You may need this vaccine if you have not already been vaccinated. Zoster (shingles) vaccine  You may need this after age 71. Pneumococcal conjugate (PCV13) vaccine  One dose is recommended after age 71. Pneumococcal polysaccharide (PPSV23) vaccine  One dose is recommended after age 71. Measles, mumps, and rubella (MMR) vaccine  You may need at least one dose of MMR if you were born in 1957 or later. You may also need a second dose. Meningococcal conjugate (MenACWY) vaccine  You may need this if you have certain conditions. Hepatitis A vaccine  You may need this if you have certain conditions or if you travel or work in places where you may be exposed  to hepatitis A. Hepatitis B vaccine  You may need this if you have certain conditions or if you travel or work in places where you may be exposed to hepatitis B. Haemophilus influenzae type b (Hib) vaccine  You may need this if you have certain conditions. You may receive vaccines as individual doses or as more than one vaccine together in one shot (combination vaccines). Talk with your health care provider about the risks and benefits of combination vaccines. What tests do I need? Blood tests  Lipid and cholesterol levels. These may be checked every 5 years, or more frequently depending on your overall health.  Hepatitis C test.  Hepatitis B test. Screening  Lung cancer screening. You may have this screening every year starting at age 71 if you have a 30-pack-year history of smoking and currently smoke or have quit within the past 15 years.  Colorectal cancer screening. All adults should have this screening starting at age 71 and continuing until age 15. Your health care provider may recommend screening at age 23 if you are at increased risk. You will have tests every 1-10 years, depending on your results and the type of screening test.  Diabetes screening. This is done by checking your blood sugar (glucose) after you have not eaten for a while (fasting). You may have this done every 1-3 years.  Mammogram. This may be done every 1-2 years. Talk with your health care provider about how often you should have regular mammograms.  BRCA-related cancer screening. This may be done if you have a family history of breast, ovarian, tubal, or peritoneal cancers.  Other tests  Sexually transmitted disease (STD) testing.  Bone density scan. This is done to screen for osteoporosis. You may have this done starting at age 71. Follow these instructions at home: Eating and drinking  Eat a diet that includes fresh fruits and vegetables, whole grains, lean protein, and low-fat dairy products. Limit  your intake of foods with high amounts of sugar, saturated fats, and salt.  Take vitamin and mineral supplements as recommended by your health care provider.  Do not drink alcohol if your health care provider tells you not to drink.  If you drink alcohol: ? Limit how much you have to 0-1 drink a day. ? Be aware of how much alcohol is in your drink. In the [place] one drink equals one 12 oz bottle of beer (355 mL), one 5 oz glass of wine (148 mL), or one 1 oz glass of hard liquor (44 mL). Lifestyle  Take daily care of your teeth and gums.  Stay active. Exercise for at least 30 minutes on 5 or more days each week.  Do not use any products that contain nicotine or tobacco, such as cigarettes, e-cigarettes, and chewing tobacco. If you need help quitting, ask your health care provider.  If you are sexually active, practice safe sex. Use a condom or other form of protection in order to prevent STIs (sexually transmitted infections).  Talk with your health care provider about taking a low-dose aspirin or statin. What's next?  Go to your health care provider once a year for a well check visit.  Ask your health care provider how often you should have your eyes and teeth checked.  Stay up to date on all vaccines. This information is not intended to replace advice given to you by your health care provider. Make sure you discuss any questions you have with your health care provider. Document Released: 02/24/2015 Document Revised: 01/22/2018 Document Reviewed: 01/22/2018 Elsevier Patient Education  2020 [employer].

## 2018-11-24 ENCOUNTER — Encounter: Payer: Self-pay | Admitting: Family Medicine

## 2018-11-24 DIAGNOSIS — R1011 Right upper quadrant pain: Secondary | ICD-10-CM

## 2018-11-24 NOTE — Telephone Encounter (Signed)
I have ordered upper GI and will try to get for early morning if possible.  Radiology will instruct her regarding specific guidelines and instructions

## 2018-11-27 DIAGNOSIS — R69 Illness, unspecified: Secondary | ICD-10-CM | POA: Diagnosis not present

## 2018-12-10 DIAGNOSIS — Z0100 Encounter for examination of eyes and vision without abnormal findings: Secondary | ICD-10-CM | POA: Diagnosis not present

## 2018-12-22 ENCOUNTER — Other Ambulatory Visit: Payer: Self-pay | Admitting: Family Medicine

## 2018-12-25 NOTE — Telephone Encounter (Signed)
Patient checking on the status of 11/10 medication request for hydrOXYzine (ATARAX/VISTARIL) 25 MG tablet, please advise     [facility] [place], [place] [address]

## 2018-12-25 NOTE — Telephone Encounter (Signed)
See refill request.

## 2018-12-26 ENCOUNTER — Encounter: Payer: Self-pay | Admitting: Family Medicine

## 2018-12-28 ENCOUNTER — Ambulatory Visit: Payer: Medicare HMO | Admitting: Cardiovascular Disease

## 2019-01-15 ENCOUNTER — Ambulatory Visit
Admission: RE | Admit: 2019-01-15 | Discharge: 2019-01-15 | Disposition: A | Discharge status: Home / Self Care | Payer: Medicare HMO | Source: Ambulatory Visit | Attending: Family Medicine | Admitting: Family Medicine

## 2019-01-15 ENCOUNTER — Encounter: Payer: Self-pay | Admitting: Family Medicine

## 2019-01-15 DIAGNOSIS — R1011 Right upper quadrant pain: Secondary | ICD-10-CM

## 2019-01-19 ENCOUNTER — Other Ambulatory Visit: Payer: Self-pay | Admitting: Family Medicine

## 2019-02-16 NOTE — Progress Notes (Signed)
Cardiology Office Note    Date:  03/01/2019   ID:  Teresa Booker, DOB 1945/06/02, MRN [id number]  PCP:  Teresa Post, MD  Cardiologist: Teresa Rouge, MD EPS: None   History of Present Illness:  Teresa Booker is a 74 y.o. female , partner of Teresa Booker, Pooler in our office, with history of hyperlipidemia on statin, palpitations with Holter 04/18/2017 showing PVCs treated with Toprol, normal NST 05/06/2017 LVEF 70% echo 04/2017 LVEF 60 to 65% with anterior leaflet prolapse no significant MR  Seen by PA July 2020 with increased palpitations due to prednisone and excess caffeine intake Toprol dose increased   Now doing some better Getting use to taking higher dose Toprol in am with less fatigue Finds reading novels helps with her anxiety   Getting COVID vaccine in February   Past Medical History:  Diagnosis Date  . Alcohol abuse    - abstemious 20 years. Attends AA - strong recovery   . Allergic reaction   . Allergy    seasonal  . Anxiety   . Fibromyalgia   . GERD (gastroesophageal reflux disease)   . Hyperlipemia   . IBS (irritable bowel syndrome)   . Postnasal drip   . RBBB (right bundle branch block)   . Rotator cuff syndrome    right    Past Surgical History:  Procedure Laterality Date  . COLONOSCOPY    . MOUTH SURGERY  2002  . ROTATOR CUFF REPAIR  10-24-05   had bone spur and laceration off cuff. [place]    Current Medications: Current Meds  Medication Sig  . aspirin 81 MG tablet Take 81 mg by mouth daily.  . Calcium 200 MG TABS Take 1 tablet by mouth daily.   . diphenhydramine-acetaminophen (TYLENOL PM) 25-500 MG TABS tablet Take 1 tablet by mouth at bedtime as needed (for sleep).  . Echinacea 125 MG CAPS Take 1 capsule by mouth daily.   . hydrOXYzine (ATARAX/VISTARIL) 25 MG tablet TAKE 1 TABLET BY MOUTH EVERY 8 HOURS AS NEEDED  . hyoscyamine (LEVBID) 0.375 MG 12 hr tablet Take 1 tablet by mouth twice daily  . L-Lysine 500 MG TABS Take 500 mg by mouth daily.    Marland Kitchen loratadine (CLARITIN) 10 MG tablet Take 1 tablet (10 mg total) by mouth daily.  . Magnesium 250 MG TABS Take 250 mg by mouth daily.  . metoprolol succinate (TOPROL-XL) 50 MG 24 hr tablet Take 1 tablet (50 mg total) by mouth daily. Take with or immediately following a meal.  . Multiple Vitamin (MULTIVITAMIN) capsule Take 1 capsule by mouth daily.    Marland Kitchen omeprazole (PRILOSEC) 20 MG capsule Take 20 mg by mouth daily.    . rosuvastatin (CRESTOR) 5 MG tablet Take 1 tablet (5 mg total) by mouth at bedtime.     Allergies:   Codeine, Penicillins, Sulfonamide derivatives, and Aspirin   Social History   Socioeconomic History  . Marital status: Single    Spouse name: Not on file  . Number of children: Not on file  . Years of education: 10  . Highest education level: Not on file  Occupational History  . Occupation: Freight forwarder    Comment: retired  Tobacco Use  . Smoking status: Former Smoker    Quit date: 03/12/1996    Years since quitting: 22.9  . Smokeless tobacco: Never Used  . Tobacco comment: quit appox 10 yrs ago  Substance and Sexual Activity  . Alcohol use: No    Alcohol/week:  0.0 standard drinks    Comment: former usuer AA 20+yrs ago strong recovery  . Drug use: No  . Sexual activity: Yes    Partners: Female  Other Topics Concern  . Not on file  Social History Narrative   [employer]   Long-term relationship - '95    Currently attending classes in medical coding (May '12)   Social Determinants of Health   Financial Resource Strain:   . Difficulty of Paying Living Expenses: Not on file  Food Insecurity:   . Worried About Charity fundraiser in the Last Year: Not on file  . Ran Out of Food in the Last Year: Not on file  Transportation Needs:   . Lack of Transportation (Medical): Not on file  . Lack of Transportation (Non-Medical): Not on file  Physical Activity:   . Days of Exercise per Week: Not on file  . Minutes of Exercise per Session: Not on file  Stress:   .  Feeling of Stress : Not on file  Social Connections:   . Frequency of Communication with Friends and Family: Not on file  . Frequency of Social Gatherings with Friends and Family: Not on file  . Attends Religious Services: Not on file  . Active Member of Clubs or Organizations: Not on file  . Attends Archivist Meetings: Not on file  . Marital Status: Not on file     Family History:  The patient's   family history includes Aneurysm in her mother and another family member; Cancer in her brother; Coronary artery disease in her mother; Diabetes in her brother; Heart attack in her mother; Hyperlipidemia in her mother; Irritable bowel syndrome in her mother; Lung cancer in an other family member; Stroke in an other family member.   ROS:   Please see the history of present illness.    ROS All other systems reviewed and are negative.   PHYSICAL EXAM:   VS:  BP 136/78 (BP Location: Left Arm, Patient Position: Sitting, Cuff Size: Normal)   Pulse 68   Ht 5\' 5"  (1.651 m)   Wt 148 lb (67.1 kg)   SpO2 98%   BMI 24.63 kg/m    Affect appropriate Healthy:  appears stated age HEENT: normal Neck supple with no adenopathy JVP normal no bruits no thyromegaly Lungs clear with no wheezing and good diaphragmatic motion Heart:  S1/S2 no murmur, no rub, gallop or click PMI normal Abdomen: benighn, BS positve, no tenderness, no AAA no bruit.  No HSM or HJR Distal pulses intact with no bruits No edema Neuro non-focal Skin warm and dry No muscular weakness  Wt Readings from Last 3 Encounters:  03/01/19 148 lb (67.1 kg)  11/20/18 147 lb 4 oz (66.8 kg)  08/25/18 144 lb 12.8 oz (65.7 kg)      Studies/Labs Reviewed:   EKG:   08/25/18 NSR rate 76 nonspecific ST changes with frequent PVC's  Recent Labs: 08/25/2018: ALT 28; BUN 17; Creatinine, Ser 1.02; Hemoglobin 12.4; Magnesium 2.3; Platelets 250; Potassium 4.7; Sodium 143; TSH 1.850   Lipid Panel    Component Value Date/Time   CHOL  168 04/08/2018 0835   TRIG 148 04/08/2018 0835   HDL 52 04/08/2018 0835   CHOLHDL 3.2 04/08/2018 0835   CHOLHDL 2.7 10/05/2015 0758   VLDL 28 10/05/2015 0758   LDLCALC 86 04/08/2018 0835    Additional studies/ records that were reviewed today include:  NST 05/06/2017   Nuclear stress EF: 70%.  There  was no ST segment deviation noted during stress.  Blood pressure demonstrated a hypertensive response to exercise.  The study is normal.  This is a low risk study.  The left ventricular ejection fraction is hyperdynamic (>65%).   Normal exercise nuclear stress test with no evidence for prior infarct or ischemia.  Normal LVEF. Poor exercise capacity. Hypertensive response to exercise.   2D echo 3/26/2019Study Conclusions   - Left ventricle: The cavity size was normal. Systolic function was   normal. The estimated ejection fraction was in the range of 60%   to 65%. Wall motion was normal; there were no regional wall   motion abnormalities. Left ventricular diastolic function   parameters were normal. - Mitral valve: Mild, late systolicprolapse, involving the anterior   leaflet. There was no significant regurgitation. - Pulmonary arteries: PA peak pressure: 32 mm Hg (S).   Holter monitor 04/2017 Sinus rhythm PAC;s/ PVC;s No significant arrhythmias       ASSESSMENT:    No diagnosis found.   PLAN:  In order of problems listed above:   Palpitations PVCs and PACs on Holter monitor 04/2017 worse in June 2020 on prednisone for bee sting and excess caffeine Better on higher dose Toprol .Labs Including TSH/Hct/K and Cr normal 08/25/18   Hyperlipidemia on Crestor LDL 86 03/2018   Medication Adjustments/Labs and Tests Ordered: Current medicines are reviewed at length with the patient today.  Concerns regarding medicines are outlined above.  Medication changes, Labs and Tests ordered today are listed in the Patient Instructions below. Patient Instructions  Medication  Instructions:   *If you need a refill on your cardiac medications before your next appointment, please call your pharmacy*  Lab Work:  If you have labs (blood work) drawn today and your tests are completely normal, you will receive your results only by: Marland Kitchen MyChart Message (if you have MyChart) OR . A paper copy in the mail If you have any lab test that is abnormal or we need to change your treatment, we will call you to review the results.  Follow-Up: At [facility], you and your health needs are our priority.  As part of our continuing mission to provide you with exceptional heart care, we have created designated Provider Care Teams.  These Care Teams include your primary Cardiologist (physician) and Advanced Practice Providers (APPs -  Physician Assistants and Nurse Practitioners) who all work together to provide you with the care you need, when you need it.  Your next appointment:   6 months  The format for your next appointment:   In Person  Provider:   You may see Teresa Rouge, MD or one of the following Advanced Practice Providers on your designated Care Team:    Truitt Merle, NP  Cecilie Kicks, NP  Kathyrn Drown, NP       Signed, Teresa Rouge, MD  03/01/2019 10:02 AM    [address], [place], [place]  [address] Phone: [phone]; Fax: [phone]

## 2019-02-18 ENCOUNTER — Telehealth: Payer: Self-pay | Admitting: *Deleted

## 2019-02-18 NOTE — Telephone Encounter (Signed)
Copied from [place] [id number]. Topic: General - Inquiry >> Feb 18, 2019 11:10 AM Richardo Priest, NT wrote: Reason for CRM: Adrienne with [employer] called in checking on status of PA that was sent back, multiple times, around 11/20/2018 for the pt to have the TB vaccine covered. Pt had to end up paying the claim and [employer] is wondering if office would be able to reimburse pt the 119.00. Please advise.

## 2019-02-19 NOTE — Telephone Encounter (Signed)
This looks like it needs to go to billing and I am not sure what to do with this? Can you help? I was out for surgery from the end of September until November 16th so I am not sure who was here to complete this request.

## 2019-02-19 NOTE — Telephone Encounter (Signed)
I tried to reach [place] at [place] and it was not a working number. She will need to call billing at [phone]. I do not see a credit on patient's account where the claim was paid.

## 2019-03-01 ENCOUNTER — Ambulatory Visit: Payer: Medicare HMO | Admitting: Cardiovascular Disease

## 2019-03-01 ENCOUNTER — Encounter: Payer: Self-pay | Admitting: Cardiovascular Disease

## 2019-03-01 ENCOUNTER — Other Ambulatory Visit: Payer: Self-pay

## 2019-03-01 VITALS — BP 136/78 | HR 68 | Ht 65.0 in | Wt 148.0 lb

## 2019-03-01 DIAGNOSIS — R002 Palpitations: Secondary | ICD-10-CM

## 2019-03-01 DIAGNOSIS — E785 Hyperlipidemia, unspecified: Secondary | ICD-10-CM

## 2019-03-01 NOTE — Patient Instructions (Addendum)
Medication Instructions:   *If you need a refill on your cardiac medications before your next appointment, please call your pharmacy*  Lab Work:  If you have labs (blood work) drawn today and your tests are completely normal, you will receive your results only by: Marland Kitchen MyChart Message (if you have MyChart) OR . A paper copy in the mail If you have any lab test that is abnormal or we need to change your treatment, we will call you to review the results.  Follow-Up: At [facility], you and your health needs are our priority.  As part of our continuing mission to provide you with exceptional heart care, we have created designated Provider Care Teams.  These Care Teams include your primary Cardiologist (physician) and Advanced Practice Providers (APPs -  Physician Assistants and Nurse Practitioners) who all work together to provide you with the care you need, when you need it.  Your next appointment:   6 months  The format for your next appointment:   In Person  Provider:   You may see Jenkins Rouge, MD or one of the following Advanced Practice Providers on your designated Care Team:    Truitt Merle, NP  Cecilie Kicks, NP  Kathyrn Drown, NP

## 2019-03-01 NOTE — Progress Notes (Signed)
Ordered lab work per [employer] on 03/01/19

## 2019-03-03 ENCOUNTER — Other Ambulatory Visit: Payer: Self-pay

## 2019-03-03 ENCOUNTER — Other Ambulatory Visit: Payer: Medicare HMO | Admitting: *Deleted

## 2019-03-03 DIAGNOSIS — E785 Hyperlipidemia, unspecified: Secondary | ICD-10-CM | POA: Diagnosis not present

## 2019-03-03 DIAGNOSIS — R002 Palpitations: Secondary | ICD-10-CM

## 2019-03-03 DIAGNOSIS — R7309 Other abnormal glucose: Secondary | ICD-10-CM | POA: Diagnosis not present

## 2019-03-04 LAB — LIPID PANEL
Chol/HDL Ratio: 2.9 ratio (ref 0.0–4.4)
Cholesterol, Total: 161 mg/dL (ref 100–199)
HDL: 55 mg/dL (ref >39)
LDL Chol Calc (NIH): 83 mg/dL (ref 0–99)
Triglycerides: 128 mg/dL (ref 0–149)
VLDL Cholesterol Cal: 23 mg/dL (ref 5–40)

## 2019-03-04 LAB — HEMOGLOBIN A1C
Est. average glucose Bld gHb Est-mCnc: 123 mg/dL
Hgb A1c MFr Bld: 5.9 % — ABNORMAL HIGH (ref 4.8–5.6)

## 2019-03-04 LAB — HEPATIC FUNCTION PANEL
ALT: 17 IU/L (ref 0–32)
AST: 22 IU/L (ref 0–40)
Albumin: 4.7 g/dL (ref 3.7–4.7)
Alkaline Phosphatase: 66 IU/L (ref 39–117)
Bilirubin Total: 0.4 mg/dL (ref 0.0–1.2)
Bilirubin, Direct: 0.1 mg/dL (ref 0.00–0.40)
Total Protein: 7 g/dL (ref 6.0–8.5)

## 2019-03-09 ENCOUNTER — Other Ambulatory Visit: Payer: Self-pay | Admitting: Cardiovascular Disease

## 2019-03-09 DIAGNOSIS — R69 Illness, unspecified: Secondary | ICD-10-CM | POA: Diagnosis not present

## 2019-03-12 ENCOUNTER — Other Ambulatory Visit: Payer: Self-pay

## 2019-03-12 ENCOUNTER — Ambulatory Visit: Payer: Medicare HMO

## 2019-03-12 DIAGNOSIS — E785 Hyperlipidemia, unspecified: Secondary | ICD-10-CM

## 2019-03-12 NOTE — Progress Notes (Signed)
Per Dr. Johnsie Cancel, okay for patient to have CT calcium score

## 2019-03-18 ENCOUNTER — Ambulatory Visit: Payer: Medicare HMO | Attending: Internal Medicine

## 2019-03-18 DIAGNOSIS — Z23 Encounter for immunization: Secondary | ICD-10-CM

## 2019-03-18 NOTE — Progress Notes (Signed)
   Covid-19 Vaccination Clinic  Name:  MIRIAH MARUYAMA    MRN: [id number] DOB: [birth year]  03/18/2019  Ms. Lipsey was observed post Covid-19 immunization for 15 minutes without incidence. She was provided with Vaccine Information Sheet and instruction to access the V-Safe system.   Ms. Verry was instructed to call 911 with any severe reactions post vaccine: Marland Kitchen Difficulty breathing  . Swelling of your face and throat  . A fast heartbeat  . A bad rash all over your body  . Dizziness and weakness    Immunizations Administered    Name Date Dose VIS Date Route   Pfizer COVID-19 Vaccine 03/18/2019  2:49 PM 0.3 mL 01/22/2019 Intramuscular   Manufacturer: [place]   Lot: [id number]   [place]: [id number]

## 2019-03-24 DIAGNOSIS — H5203 Hypermetropia, bilateral: Secondary | ICD-10-CM | POA: Diagnosis not present

## 2019-03-24 DIAGNOSIS — Z0101 Encounter for examination of eyes and vision with abnormal findings: Secondary | ICD-10-CM | POA: Diagnosis not present

## 2019-03-29 ENCOUNTER — Ambulatory Visit: Payer: Medicare HMO

## 2019-03-30 ENCOUNTER — Ambulatory Visit (INDEPENDENT_AMBULATORY_CARE_PROVIDER_SITE_OTHER): Payer: Medicare HMO

## 2019-03-30 ENCOUNTER — Other Ambulatory Visit: Payer: Self-pay | Admitting: Family Medicine

## 2019-03-30 VITALS — Ht 66.0 in | Wt 144.0 lb

## 2019-03-30 DIAGNOSIS — Z0000 Encounter for general adult medical examination without abnormal findings: Secondary | ICD-10-CM

## 2019-03-30 NOTE — Patient Instructions (Addendum)
Teresa Booker , Thank you for taking time to participate in your Medicare Wellness Visit. I appreciate your ongoing commitment to your health goals. Please review the following plan we discussed and let me know if I can assist you in the future.   Screening recommendations/referrals: Colorectal Screening: colonoscopy completed 01/10/2015; due 01/11/2020.  Mammogram: completed 09/22/2018; due 09/23/2019. Bone Density: completed 03/19/2018; due 03/21/2023.   Vision and Dental Exams: Recommended annual ophthalmology exams for early detection of glaucoma and other disorders of the eye Recommended annual dental exams for proper oral hygiene  Diabetic Exams: Diabetic Eye Exam: N/A Diabetic Foot Exam: N/A  Vaccinations: Influenza vaccine: completed 11/27/2018; due again in fall 2021. Pneumococcal vaccine: completed 01/23/2009.  Tdap vaccine: completed 11/20/2018; due 11/19/2028. Shingles vaccine: Please call your insurance company to determine your out of pocket expense for the Shingrix vaccine. You may receive this vaccine at your local pharmacy.  Advanced directives: Advance directives discussed with you today.  Please bring a copy of your POA (Power of [place]) and/or Living Will to your next appointment.  Goals: Recommend to drink at least 6-8 8oz glasses of water per day.  Recommend to exercise for at least 150 minutes per week.  Recommend to remove any items from the home that may cause slips or trips.  Next appointment: Please schedule your Annual Wellness Visit with your Nurse Health Advisor in one year.  Preventive Care 23 Years and Older, Female Preventive care refers to lifestyle choices and visits with your health care provider that can promote health and wellness. What does preventive care include?  A yearly physical exam. This is also called an annual well check.  Dental exams once or twice a year.  Routine eye exams. Ask your health care provider how often you should have  your eyes checked.  Personal lifestyle choices, including:  Daily care of your teeth and gums.  Regular physical activity.  Eating a healthy diet.  Avoiding tobacco and drug use.  Limiting alcohol use.  Practicing safe sex.  Taking low-dose aspirin every day if recommended by your health care provider.  Taking vitamin and mineral supplements as recommended by your health care provider. What happens during an annual well check? The services and screenings done by your health care provider during your annual well check will depend on your age, overall health, lifestyle risk factors, and family history of disease. Counseling  Your health care provider may ask you questions about your:  Alcohol use.  Tobacco use.  Drug use.  Emotional well-being.  Home and relationship well-being.  Sexual activity.  Eating habits.  History of falls.  Memory and ability to understand (cognition).  Work and work Statistician.  Reproductive health. Screening  You may have the following tests or measurements:  Height, weight, and BMI.  Blood pressure.  Lipid and cholesterol levels. These may be checked every 5 years, or more frequently if you are over 51 years old.  Skin check.  Lung cancer screening. You may have this screening every year starting at age 98 if you have a 30-pack-year history of smoking and currently smoke or have quit within the past 15 years.  Fecal occult blood test (FOBT) of the stool. You may have this test every year starting at age 43.  Flexible sigmoidoscopy or colonoscopy. You may have a sigmoidoscopy every 5 years or a colonoscopy every 10 years starting at age 58.  Hepatitis C blood test.  Hepatitis B blood test.  Sexually transmitted disease (STD) testing.  Diabetes screening. This is done by checking your blood sugar (glucose) after you have not eaten for a while (fasting). You may have this done every 1-3 years.  Bone density scan. This is  done to screen for osteoporosis. You may have this done starting at age 22.  Mammogram. This may be done every 1-2 years. Talk to your health care provider about how often you should have regular mammograms. Talk with your health care provider about your test results, treatment options, and if necessary, the need for more tests. Vaccines  Your health care provider may recommend certain vaccines, such as:  Influenza vaccine. This is recommended every year.  Tetanus, diphtheria, and acellular pertussis (Tdap, Td) vaccine. You may need a Td booster every 10 years.  Zoster vaccine. You may need this after age 15.  Pneumococcal 13-valent conjugate (PCV13) vaccine. One dose is recommended after age 42.  Pneumococcal polysaccharide (PPSV23) vaccine. One dose is recommended after age 8. Talk to your health care provider about which screenings and vaccines you need and how often you need them. This information is not intended to replace advice given to you by your health care provider. Make sure you discuss any questions you have with your health care provider. Document Released: 02/24/2015 Document Revised: 10/18/2015 Document Reviewed: 11/29/2014 Elsevier Interactive Patient Education  2017 [place] Prevention in the Home Falls can cause injuries. They can happen to people of all ages. There are many things you can do to make your home safe and to help prevent falls. What can I do on the outside of my home?  Regularly fix the edges of walkways and driveways and fix any cracks.  Remove anything that might make you trip as you walk through a door, such as a raised step or threshold.  Trim any bushes or trees on the path to your home.  Use bright outdoor lighting.  Clear any walking paths of anything that might make someone trip, such as rocks or tools.  Regularly check to see if handrails are loose or broken. Make sure that both sides of any steps have handrails.  Any raised  decks and porches should have guardrails on the edges.  Have any leaves, snow, or ice cleared regularly.  Use sand or salt on walking paths during winter.  Clean up any spills in your garage right away. This includes oil or grease spills. What can I do in the bathroom?  Use night lights.  Install grab bars by the toilet and in the tub and shower. Do not use towel bars as grab bars.  Use non-skid mats or decals in the tub or shower.  If you need to sit down in the shower, use a plastic, non-slip stool.  Keep the floor dry. Clean up any water that spills on the floor as soon as it happens.  Remove soap buildup in the tub or shower regularly.  Attach bath mats securely with double-sided non-slip rug tape.  Do not have throw rugs and other things on the floor that can make you trip. What can I do in the bedroom?  Use night lights.  Make sure that you have a light by your bed that is easy to reach.  Do not use any sheets or blankets that are too big for your bed. They should not hang down onto the floor.  Have a firm chair that has side arms. You can use this for support while you get dressed.  Do not have throw  rugs and other things on the floor that can make you trip. What can I do in the kitchen?  Clean up any spills right away.  Avoid walking on wet floors.  Keep items that you use a lot in easy-to-reach places.  If you need to reach something above you, use a strong step stool that has a grab bar.  Keep electrical cords out of the way.  Do not use floor polish or wax that makes floors slippery. If you must use wax, use non-skid floor wax.  Do not have throw rugs and other things on the floor that can make you trip. What can I do with my stairs?  Do not leave any items on the stairs.  Make sure that there are handrails on both sides of the stairs and use them. Fix handrails that are broken or loose. Make sure that handrails are as long as the stairways.  Check  any carpeting to make sure that it is firmly attached to the stairs. Fix any carpet that is loose or worn.  Avoid having throw rugs at the top or bottom of the stairs. If you do have throw rugs, attach them to the floor with carpet tape.  Make sure that you have a light switch at the top of the stairs and the bottom of the stairs. If you do not have them, ask someone to add them for you. What else can I do to help prevent falls?  Wear shoes that:  Do not have high heels.  Have rubber bottoms.  Are comfortable and fit you well.  Are closed at the toe. Do not wear sandals.  If you use a stepladder:  Make sure that it is fully opened. Do not climb a closed stepladder.  Make sure that both sides of the stepladder are locked into place.  Ask someone to hold it for you, if possible.  Clearly mark and make sure that you can see:  Any grab bars or handrails.  First and last steps.  Where the edge of each step is.  Use tools that help you move around (mobility aids) if they are needed. These include:  Canes.  Walkers.  Scooters.  Crutches.  Turn on the lights when you go into a dark area. Replace any light bulbs as soon as they burn out.  Set up your furniture so you have a clear path. Avoid moving your furniture around.  If any of your floors are uneven, fix them.  If there are any pets around you, be aware of where they are.  Review your medicines with your doctor. Some medicines can make you feel dizzy. This can increase your chance of falling. Ask your doctor what other things that you can do to help prevent falls. This information is not intended to replace advice given to you by your health care provider. Make sure you discuss any questions you have with your health care provider. Document Released: 11/24/2008 Document Revised: 07/06/2015 Document Reviewed: 03/04/2014 Elsevier Interactive Patient Education  2017 [employer].

## 2019-03-30 NOTE — Progress Notes (Signed)
This visit is being conducted via phone call due to the COVID-19 pandemic. This patient has given me verbal consent via phone to conduct this visit, patient states they are participating from their home address. Some vital signs may be absent or patient reported.   Patient identification: identified by name, DOB, and current address.  Location provider: [facility], Office Persons participating in the virtual visit: Teresa Booker and Teresa Forts, LPN.    Subjective:   Teresa Booker is a 74 y.o. female who presents for an Initial Medicare Annual Wellness Visit. She is sleeping well and 7 hours nightly, eating healthy, balanced meals with minimal fried foods and plenty of fresh fruits and vegetables. She will return to [facility] when virus pandemic settles down. She walks outside when weather permits.   Review of Systems    No ROS: Annual Medicare Wellness Visit  Cardiac Risk Factors include: dyslipidemia;advanced age (>50men, >74 women);sedentary lifestyle     Objective:    Today's Vitals   03/30/19 0959  Weight: 144 lb (65.3 kg)  Height: 5\' 6"  (1.676 m)   Body mass index is 23.24 kg/m.  Advanced Directives 03/30/2019  Does Patient Have a Medical Advance Directive? Yes  Type of Paramedic of [place];Living will  Does patient want to make changes to medical advance directive? No - Patient declined  Copy of [place] in Chart? No - copy requested    Current Medications (verified) Outpatient Encounter Medications as of 03/30/2019  Medication Sig  . aspirin 81 MG tablet Take 81 mg by mouth daily.  . Calcium 200 MG TABS Take 1 tablet by mouth daily.   . diphenhydramine-acetaminophen (TYLENOL PM) 25-500 MG TABS tablet Take 1 tablet by mouth at bedtime as needed (for sleep).  . Echinacea 125 MG CAPS Take 1 capsule by mouth daily.   . hydrOXYzine (ATARAX/VISTARIL) 25 MG tablet TAKE 1 TABLET BY MOUTH EVERY 8 HOURS AS NEEDED  . hyoscyamine  (LEVBID) 0.375 MG 12 hr tablet Take 1 tablet by mouth twice daily  . L-Lysine 500 MG TABS Take 500 mg by mouth daily.  Marland Kitchen loratadine (CLARITIN) 10 MG tablet Take 1 tablet (10 mg total) by mouth daily.  . Magnesium 250 MG TABS Take 250 mg by mouth daily.  . metoprolol succinate (TOPROL-XL) 50 MG 24 hr tablet Take 1 tablet (50 mg total) by mouth daily. Take with or immediately following a meal.  . Multiple Vitamin (MULTIVITAMIN) capsule Take 1 capsule by mouth daily.    Marland Kitchen omeprazole (PRILOSEC) 20 MG capsule Take 20 mg by mouth daily.    . rosuvastatin (CRESTOR) 5 MG tablet TAKE 1 TABLET BY MOUTH AT BEDTIME   No facility-administered encounter medications on file as of 03/30/2019.    Allergies (verified) Codeine, Penicillins, Sulfonamide derivatives, and Aspirin   History: Past Medical History:  Diagnosis Date  . Alcohol abuse    - abstemious 20 years. Attends AA - strong recovery   . Allergic reaction   . Allergy    seasonal  . Anxiety   . Fibromyalgia   . GERD (gastroesophageal reflux disease)   . Hyperlipemia   . IBS (irritable bowel syndrome)   . Postnasal drip   . RBBB (right bundle branch block)   . Rotator cuff syndrome    right   Past Surgical History:  Procedure Laterality Date  . COLONOSCOPY    . MOUTH SURGERY  2002  . ROTATOR CUFF REPAIR  10-24-05   had  bone spur and laceration off cuff. Daldorf   Family History  Problem Relation Age of Onset  . Hyperlipidemia Mother   . Irritable bowel syndrome Mother   . Coronary artery disease Mother   . Heart attack Mother   . Aneurysm Mother        brain  . Diabetes Brother   . Cancer Brother        small cell lung ca with mets/ smoker  . Stroke Other   . Aneurysm Other   . Lung cancer Other   . Colon cancer Neg Hx    Social History   Socioeconomic History  . Marital status: Single    Spouse name: Not on file  . Number of children: 0  . Years of education: 16  . Highest education level: Bachelor's degree (e.g.,  BA, AB, BS)  Occupational History  . Occupation: Freight forwarder    Comment: retired  Tobacco Use  . Smoking status: Former Smoker    Quit date: 03/12/1996    Years since quitting: 23.0  . Smokeless tobacco: Never Used  . Tobacco comment: quit appox 10 yrs ago  Substance and Sexual Activity  . Alcohol use: No    Alcohol/week: 0.0 standard drinks    Comment: former usuer AA 20+yrs ago strong recovery  . Drug use: No  . Sexual activity: Yes    Partners: Female  Other Topics Concern  . Not on file  Social History Narrative   03/30/19:   [employer]   Long-term relationship - '95   1 cat   Social Determinants of Health   Financial Resource Strain: Low Risk   . Difficulty of Paying Living Expenses: Not hard at all  Food Insecurity: No Food Insecurity  . Worried About Charity fundraiser in the Last Year: Never true  . Ran Out of Food in the Last Year: Never true  Transportation Needs: No Transportation Needs  . Lack of Transportation (Medical): No  . Lack of Transportation (Non-Medical): No  Physical Activity: Sufficiently Active  . Days of Exercise per Week: 3 days  . Minutes of Exercise per Session: 60 min  Stress: Stress Concern Present  . Feeling of Stress : To some extent  Social Connections: Unknown  . Frequency of Communication with Friends and Family: More than three times a week  . Frequency of Social Gatherings with Friends and Family: Once a week  . Attends Religious Services: Never  . Active Member of Clubs or Organizations: Yes  . Attends Archivist Meetings: More than 4 times per year  . Marital Status: Not on file    Tobacco Counseling Counseling given: Not Answered Comment: quit appox 10 yrs ago   Clinical Intake:  Pre-visit preparation completed: Yes  Pain : No/denies pain     BMI - recorded: 23.24 Nutritional Status: BMI of 19-24  Normal Diabetes: No  How often do you need to have someone help you when you read  instructions, pamphlets, or other written materials from your doctor or pharmacy?: 1 - Never What is the last grade level you completed in school?: 4 years college  Interpreter Needed?: No  Information entered by :: Teresa Forts, LPN.   Activities of Daily Living In your present state of health, do you have any difficulty performing the following activities: 03/30/2019  Hearing? N  Vision? N  Difficulty concentrating or making decisions? N  Walking or climbing stairs? N  Dressing or bathing? N  Doing  errands, shopping? N  Preparing Food and eating ? N  Using the Toilet? N  In the past six months, have you accidently leaked urine? N  Do you have problems with loss of bowel control? N  Managing your Medications? N  Managing your Finances? N  Housekeeping or managing your Housekeeping? N  Some recent data might be hidden     Immunizations and Health Maintenance Immunization History  Administered Date(s) Administered  . Influenza Split 11/29/2010  . Influenza, High Dose Seasonal PF 11/27/2018  . Influenza,inj,Quad PF,6+ Mos 01/11/2014, 12/06/2016  . Influenza-Unspecified 12/22/2014, 12/19/2015, 11/26/2017  . PFIZER SARS-COV-2 Vaccination 03/18/2019  . Pneumococcal Polysaccharide-23 01/23/2009  . Td 01/23/2009, 11/20/2018  . Zoster 07/10/2010   Health Maintenance Due  Topic Date Due  . Hepatitis C Screening  [birth year]    Patient Care Team: [name], MD as PCP - General (Family Medicine) Josue Hector, MD as PCP - Cardiology (Cardiology)  Indicate any recent Medical Services you may have received from other than [facility] providers in the past year (date may be approximate).     Assessment:   This is a routine wellness examination for Teresa Booker.  Hearing/Vision screen  Hearing Screening   125Hz  250Hz  500Hz  1000Hz  2000Hz  3000Hz  4000Hz  6000Hz  8000Hz   Right ear:           Left ear:           Comments: Denies hearing problems  Vision Screening Comments: Wears  glasses; last eye exam in Feb 2021.   Dietary issues and exercise activities discussed: Current Exercise Habits: The patient does not participate in regular exercise at present, Exercise limited by: None identified  Goals   None    Depression Screen PHQ 2/9 Scores 03/30/2019 11/20/2018 08/26/2017 05/14/2016 08/31/2014 06/27/2013  PHQ - 2 Score 0 0 0 0 0 0    Fall Risk Fall Risk  03/30/2019 11/20/2018 08/26/2017 05/14/2016 08/31/2014  Falls in the past year? 0 0 No No No  Risk for fall due to : Medication side effect - - - -  Follow up Falls evaluation completed;Education provided;Falls prevention discussed Education provided;Falls evaluation completed;Falls prevention discussed - - -    Is the patient's home free of loose throw rugs in walkways, pet beds, electrical cords, etc?   yes      Grab bars in the bathroom? yes      Handrails on the stairs?   yes      Adequate lighting?   yes  Timed Get Up and Go Performed N/A due to telephone visit  Cognitive Function:     6CIT Screen 03/30/2019 03/30/2019  What Year? 0 points 0 points  What month? 0 points 0 points  What time? 0 points 0 points  Count back from 20 0 points 0 points  Months in reverse 0 points 0 points  Repeat phrase 0 points 0 points  Total Score 0 0    Screening Tests Health Maintenance  Topic Date Due  . Hepatitis C Screening  1945/09/18  . PNA vac Low Risk Adult (1 of 2 - PCV13) 12/30/2028 (Originally 06/06/2012)  . COLONOSCOPY  01/10/2020  . MAMMOGRAM  09/21/2020  . TETANUS/TDAP  11/19/2028  . INFLUENZA VACCINE  Completed  . DEXA SCAN  Completed    Qualifies for Shingles Vaccine? yes  Cancer Screenings: Lung: Low Dose CT Chest recommended if Age 59-80 years, 30 pack-year currently smoking OR have quit w/in 15years. Patient does not qualify. Breast: Up to date on Mammogram?  Yes   Up to date of Bone Density/Dexa? Yes Colorectal: yes  Additional Screenings:  Hepatitis C Screening: needs to be completed at next  office visit with lab work.    Plan:   Teresa Booker will check with her insurance company for out of pocket cost for shingrix vaccines. Hep C screening needs to be collected at next office visit with lab work. She is up to date with all other preventive health screenings and immunizations.  I have personally reviewed and noted the following in the patient's chart:   . Medical and social history . Use of alcohol, tobacco or illicit drugs  . Current medications and supplements . Functional ability and status . Nutritional status . Physical activity . Advanced directives . List of other physicians . Hospitalizations, surgeries, and ER visits in previous 12 months . Vitals . Screenings to include cognitive, depression, and falls . Referrals and appointments  In addition, I have reviewed and discussed with patient certain preventive protocols, quality metrics, and best practice recommendations. A written personalized care plan for preventive services as well as general preventive health recommendations were provided to patient.     Teresa Forts, LPN   [id number]

## 2019-04-05 ENCOUNTER — Telehealth: Payer: Self-pay | Admitting: Cardiovascular Disease

## 2019-04-05 ENCOUNTER — Telehealth: Payer: Self-pay

## 2019-04-05 ENCOUNTER — Ambulatory Visit (INDEPENDENT_AMBULATORY_CARE_PROVIDER_SITE_OTHER)
Admission: RE | Admit: 2019-04-05 | Discharge: 2019-04-05 | Disposition: A | Discharge status: Home / Self Care | Payer: Self-pay | Source: Ambulatory Visit | Attending: Cardiovascular Disease | Admitting: Cardiovascular Disease

## 2019-04-05 ENCOUNTER — Other Ambulatory Visit: Payer: Self-pay

## 2019-04-05 DIAGNOSIS — E785 Hyperlipidemia, unspecified: Secondary | ICD-10-CM

## 2019-04-05 DIAGNOSIS — J189 Pneumonia, unspecified organism: Secondary | ICD-10-CM

## 2019-04-05 DIAGNOSIS — R911 Solitary pulmonary nodule: Secondary | ICD-10-CM

## 2019-04-05 DIAGNOSIS — R931 Abnormal findings on diagnostic imaging of heart and coronary circulation: Secondary | ICD-10-CM

## 2019-04-05 MED ORDER — ROSUVASTATIN CALCIUM 10 MG PO TABS: 10.0000 mg | ORAL_TABLET | Freq: Every day | ORAL | 3 refills | Status: DC

## 2019-04-05 NOTE — Telephone Encounter (Signed)
-----   Message from Josue Hector, MD sent at 04/05/2019 11:59 AM EST ----- Calcium score 109 71 st percentile LdL is above 70 increase crestor to 10 mg daily and repeat labs in 3 months See note regarding need for f/u non contrast chest CT in 3 months

## 2019-04-05 NOTE — Telephone Encounter (Signed)
IMPRESSION: Small focus of pulmonary infection versus ground-glass nodule in superior segment of the RIGHT lower lobe. Recommend follow-up noncontrast chest CT in 3 months to evaluate for persistence.  Called back to speak with [facility] regarding the results above.  Dr Johnsie Cancel to review and read coronary CT.  Result is in Dr Kyla Balzarine inbasket to read.

## 2019-04-05 NOTE — Telephone Encounter (Signed)
[facility] calling to give pt's call report

## 2019-04-05 NOTE — Telephone Encounter (Signed)
Called patient with results. Per Dr. Johnsie Cancel, Calcium score 109 71 st percentile LdL is above 70 increase crestor to 10 mg daily and repeat labs in 3 months See note regarding need for f/u non contrast chest CT in 3 months . Patient will get repeat lab work on 07/07/19. Sent medication to patient's pharmacy of choice. Will send message to [facility] to call and schedule CT.

## 2019-04-08 ENCOUNTER — Encounter: Payer: Self-pay | Admitting: Family Medicine

## 2019-04-08 ENCOUNTER — Other Ambulatory Visit: Payer: Self-pay

## 2019-04-08 NOTE — Telephone Encounter (Signed)
Please advise okay for refill?

## 2019-04-09 ENCOUNTER — Ambulatory Visit (INDEPENDENT_AMBULATORY_CARE_PROVIDER_SITE_OTHER): Payer: Medicare HMO | Admitting: Obstetrics & Gynecology

## 2019-04-09 ENCOUNTER — Encounter: Payer: Self-pay | Admitting: Obstetrics & Gynecology

## 2019-04-09 VITALS — BP 154/90 | Ht 65.0 in | Wt 145.0 lb

## 2019-04-09 DIAGNOSIS — Z01419 Encounter for gynecological examination (general) (routine) without abnormal findings: Secondary | ICD-10-CM | POA: Diagnosis not present

## 2019-04-09 DIAGNOSIS — Z780 Asymptomatic menopausal state: Secondary | ICD-10-CM

## 2019-04-09 MED ORDER — HYDROXYZINE HCL 25 MG PO TABS: 25.0000 mg | ORAL_TABLET | Freq: Three times a day (TID) | ORAL | 1 refills | Status: DC | PRN

## 2019-04-09 NOTE — Patient Instructions (Signed)
1. Well female exam with routine gynecological exam Normal gynecologic exam in menopause.  No longer indicated to do Pap tests.  Breast exam normal.  Screening mammogram August 2020 was negative.  Colonoscopy in 2016.  Health labs with family physician.  Good body mass index at 24.13.  Will start back swimming after her 2nd Covid vaccine.  2. Postmenopausal Well on no hormone replacement therapy.  No postmenopausal bleeding.  Bone density February 2020 was normal, will repeat at 5 years.  Continue with vitamin D supplements, calcium intake of 1200 mg daily and regular weightbearing physical activities.  Teresa Booker, it was a pleasure seeing you today!

## 2019-04-09 NOTE — Progress Notes (Signed)
Teresa Booker [birth year] [id number]   History:    74 y.o. G0 Single.  Same sex partner.  RP:  Established patient presenting for annual gyn exam   HPI:  Menopause, well on no HRT.  No PMB.  No pelvic pain.  Abstinent x 3 years.  Breasts normal.  Urine/BMs normal.  BMI 24.13.  Swimmer, but stopped during the Covid pandemic, ready to start again after she receives her 2nd vaccine.  Healthy nutrition.  Health Labs with Fam MD.  Followed by Cardio for Dysrhythmia.  Colono 2016.   Past medical history,surgical history, family history and social history were all reviewed and documented in the EPIC chart.  Gynecologic History No LMP recorded. Patient is postmenopausal.  Obstetric History OB History  Gravida Para Term Preterm AB Living  0 0 0 0 0 0  SAB TAB Ectopic Multiple Live Births  0 0 0 0 0     ROS: A ROS was performed and pertinent positives and negatives are included in the history.  GENERAL: No fevers or chills. HEENT: No change in vision, no earache, sore throat or sinus congestion. NECK: No pain or stiffness. CARDIOVASCULAR: No chest pain or pressure. No palpitations. PULMONARY: No shortness of breath, cough or wheeze. GASTROINTESTINAL: No abdominal pain, nausea, vomiting or diarrhea, melena or bright red blood per rectum. GENITOURINARY: No urinary frequency, urgency, hesitancy or dysuria. MUSCULOSKELETAL: No joint or muscle pain, no back pain, no recent trauma. DERMATOLOGIC: No rash, no itching, no lesions. ENDOCRINE: No polyuria, polydipsia, no heat or cold intolerance. No recent change in weight. HEMATOLOGICAL: No anemia or easy bruising or bleeding. NEUROLOGIC: No headache, seizures, numbness, tingling or weakness. PSYCHIATRIC: No depression, no loss of interest in normal activity or change in sleep pattern.     Exam:   BP (!) 154/90   Ht 5\' 5"  (1.651 m)   Wt 145 lb (65.8 kg)   BMI 24.13 kg/m   Body mass index is 24.13 kg/m.  General appearance : Well developed well  nourished female. No acute distress HEENT: Eyes: no retinal hemorrhage or exudates,  Neck supple, trachea midline, no carotid bruits, no thyroidmegaly Lungs: Clear to auscultation, no rhonchi or wheezes, or rib retractions  Heart: Regular rate and rhythm, no murmurs or gallops Breast:Examined in sitting and supine position were symmetrical in appearance, no palpable masses or tenderness,  no skin retraction, no nipple inversion, no nipple discharge, no skin discoloration, no axillary or supraclavicular lymphadenopathy Abdomen: no palpable masses or tenderness, no rebound or guarding Extremities: no edema or skin discoloration or tenderness  Pelvic: Vulva: Normal             Vagina: No gross lesions or discharge  Cervix: No gross lesions or discharge  Uterus  AV, normal size, shape and consistency, non-tender and mobile  Adnexa  Without masses or tenderness  Anus: Normal   Assessment/Plan:  74 y.o. female for annual exam   1. Well female exam with routine gynecological exam Normal gynecologic exam in menopause.  No longer indicated to do Pap tests.  Breast exam normal.  Screening mammogram August 2020 was negative.  Colonoscopy in 2016.  Health labs with family physician.  Good body mass index at 24.13.  Will start back swimming after her 2nd Covid vaccine.  2. Postmenopausal Well on no hormone replacement therapy.  No postmenopausal bleeding.  Bone density February 2020 was normal, will repeat at 5 years.  Continue with vitamin D supplements, calcium intake of 1200 mg  daily and regular weightbearing physical activities.  Princess Bruins MD, 9:48 AM 04/09/2019

## 2019-04-12 ENCOUNTER — Ambulatory Visit: Payer: Medicare HMO | Attending: Internal Medicine

## 2019-04-12 DIAGNOSIS — Z23 Encounter for immunization: Secondary | ICD-10-CM

## 2019-04-12 NOTE — Progress Notes (Signed)
   Covid-19 Vaccination Clinic  Name:  KIALEE KHAM    MRN: [id number] DOB: 1945/10/03  04/12/2019  Ms. Kuch was observed post Covid-19 immunization for 15 minutes without incidence. She was provided with Vaccine Information Sheet and instruction to access the V-Safe system.   Ms. Wohlfarth was instructed to call 911 with any severe reactions post vaccine: Marland Kitchen Difficulty breathing  . Swelling of your face and throat  . A fast heartbeat  . A bad rash all over your body  . Dizziness and weakness    Immunizations Administered    Name Date Dose VIS Date Route   Pfizer COVID-19 Vaccine 04/12/2019  4:58 PM 0.3 mL 01/22/2019 Intramuscular   Manufacturer: [place]   Lot: [id number]   [place]: [id number]

## 2019-05-07 ENCOUNTER — Other Ambulatory Visit: Payer: Self-pay | Admitting: Family Medicine

## 2019-05-18 ENCOUNTER — Telehealth (INDEPENDENT_AMBULATORY_CARE_PROVIDER_SITE_OTHER): Payer: Medicare HMO | Admitting: Family Medicine

## 2019-05-18 DIAGNOSIS — R1011 Right upper quadrant pain: Secondary | ICD-10-CM | POA: Diagnosis not present

## 2019-05-18 NOTE — Progress Notes (Addendum)
Patient ID: Teresa Booker, female   DOB: Oct 08, 1945, 74 y.o.   MRN: [id number]  This visit type was conducted due to national recommendations for restrictions regarding the COVID-19 pandemic in an effort to limit this patient's exposure and mitigate transmission in our community.   Virtual Visit via Telephone Note  I connected with Teresa Booker on 05/18/19 at  2:00 PM EDT by telephone and verified that I am speaking with the correct person using two identifiers.   I discussed the limitations, risks, security and privacy concerns of performing an evaluation and management service by telephone and the availability of in person appointments. I also discussed with the patient that there may be a patient responsible charge related to this service. The patient expressed understanding and agreed to proceed.  Location patient: home Location provider: work or home office Participants present for the call: patient, provider Patient did not have a visit in the prior 7 days to address this/these issue(s).   History of Present Illness: She has had several months now of intermittent right upper quadrant abdominal pain.  Her pain radiates frequently to the back.  She recently went to chiropractor and had some "adjustments ".  She felt some better afterwards.  She has a sensation of muscle spasm in the back but differentiates that from her abdominal pain which is consistently in the right upper quadrant.    She had abdominal ultrasound 2016 which did not show any gallbladder stones.  This did show some fatty liver changes she had recent upper GI series which was normal.  She has noted that her pain seems to be triggered by rich, heavy, fatty foods at times.  She has not had any associated fever.  She does have occasional nausea but no vomiting.  No stool changes.  She had recent hepatic panel through cardiology that was normal.  She had recent CT cardiac scoring and had some proximal LAD calcification.  There was also  mention of some groundglass opacity with infection versus nodule right lower lobe.  Recommendation was for 32-month follow-up CT and this has been ordered.  She denies any appetite or weight changes.  Past Medical History:  Diagnosis Date  . Alcohol abuse    - abstemious 20 years. Attends AA - strong recovery   . Allergic reaction   . Allergy    seasonal  . Anxiety   . Fibromyalgia   . GERD (gastroesophageal reflux disease)   . Hyperlipemia   . IBS (irritable bowel syndrome)   . Postnasal drip   . RBBB (right bundle branch block)   . Rotator cuff syndrome    right   Past Surgical History:  Procedure Laterality Date  . COLONOSCOPY    . MOUTH SURGERY  2002  . ROTATOR CUFF REPAIR  10-24-05   had bone spur and laceration off cuff. Daldorf    reports that she quit smoking about 23 years ago. She has never used smokeless tobacco. She reports that she does not drink alcohol or use drugs. family history includes Aneurysm in her mother and another family member; Cancer in her brother; Coronary artery disease in her mother; Diabetes in her brother; Heart attack in her mother; Hyperlipidemia in her mother; Irritable bowel syndrome in her mother; Lung cancer in an other family member; Stroke in an other family member. Allergies  Allergen Reactions  . Codeine     Makes heart race  . Penicillins     unknown  . Sulfonamide Derivatives  Has a rash with the use of silvadiene for burn  . Aspirin     Upset stomach, High dose only      Observations/Objective: Patient sounds cheerful and well on the phone. I do not appreciate any SOB. Speech and thought processing are grossly intact. Patient reported vitals:  Assessment and Plan:  Several month history of intermittent right upper quadrant abdominal pain with some associated nausea.  Differential includes gallstones versus acalculus cholecystitis versus other.  She is not any red flags such as fever, appetite loss, or weight  loss.  -Start with repeat ultrasound since her last one was several years ago.  If normal, consider HIDA scan -Avoid high-fat foods in the meantime -Follow-up immediately for any fever or worsening pain  Follow Up Instructions:  -as above.    99441 5-10 99442 11-20 99443 21-30 I did not refer this patient for an OV in the next 24 hours for this/these issue(s).  I discussed the assessment and treatment plan with the patient. The patient was provided an opportunity to ask questions and all were answered. The patient agreed with the plan and demonstrated an understanding of the instructions.   The patient was advised to call back or seek an in-person evaluation if the symptoms worsen or if the condition fails to improve as anticipated.  I provided 22 minutes of non-face-to-face time during this encounter.   Carolann Littler, MD   05-23-19.   Ultrasound normal.  Will set up HIDA scan.  [name] MD [facility]

## 2019-05-20 ENCOUNTER — Ambulatory Visit
Admission: RE | Admit: 2019-05-20 | Discharge: 2019-05-20 | Disposition: A | Discharge status: Home / Self Care | Payer: Medicare HMO | Source: Ambulatory Visit | Attending: Family Medicine | Admitting: Family Medicine

## 2019-05-20 DIAGNOSIS — K760 Fatty (change of) liver, not elsewhere classified: Secondary | ICD-10-CM | POA: Diagnosis not present

## 2019-05-20 DIAGNOSIS — R1011 Right upper quadrant pain: Secondary | ICD-10-CM

## 2019-05-22 ENCOUNTER — Encounter: Payer: Self-pay | Admitting: Family Medicine

## 2019-05-23 NOTE — Addendum Note (Signed)
Addended by: [name] on: 05/23/2019 08:28 PM   Modules accepted: Orders

## 2019-05-28 ENCOUNTER — Encounter: Payer: Self-pay | Admitting: Family Medicine

## 2019-06-11 ENCOUNTER — Encounter (HOSPITAL_COMMUNITY)
Admission: RE | Admit: 2019-06-11 | Discharge: 2019-06-11 | Disposition: A | Discharge status: Home / Self Care | Payer: Medicare HMO | Source: Ambulatory Visit | Attending: Family Medicine | Admitting: Family Medicine

## 2019-06-11 ENCOUNTER — Other Ambulatory Visit: Payer: Self-pay

## 2019-06-11 DIAGNOSIS — R1011 Right upper quadrant pain: Secondary | ICD-10-CM | POA: Diagnosis not present

## 2019-06-11 MED ORDER — TECHNETIUM TC 99M MEBROFENIN IV KIT: 5.1000 | PACK | Freq: Once | INTRAVENOUS | Status: DC | PRN

## 2019-06-14 NOTE — Telephone Encounter (Signed)
Noted  

## 2019-06-15 ENCOUNTER — Telehealth: Payer: Self-pay | Admitting: Family Medicine

## 2019-06-15 NOTE — Telephone Encounter (Signed)
Pt was returning Kendra's call. Pt needs a call back at [phone]

## 2019-06-16 NOTE — Telephone Encounter (Signed)
This has been handled via [employer]

## 2019-07-07 ENCOUNTER — Other Ambulatory Visit: Payer: Medicare HMO | Admitting: *Deleted

## 2019-07-07 ENCOUNTER — Ambulatory Visit (INDEPENDENT_AMBULATORY_CARE_PROVIDER_SITE_OTHER)
Admission: RE | Admit: 2019-07-07 | Discharge: 2019-07-07 | Disposition: A | Discharge status: Home / Self Care | Payer: Medicare HMO | Source: Ambulatory Visit | Attending: Cardiovascular Disease | Admitting: Cardiovascular Disease

## 2019-07-07 ENCOUNTER — Other Ambulatory Visit: Payer: Self-pay

## 2019-07-07 DIAGNOSIS — R931 Abnormal findings on diagnostic imaging of heart and coronary circulation: Secondary | ICD-10-CM

## 2019-07-07 DIAGNOSIS — R911 Solitary pulmonary nodule: Secondary | ICD-10-CM

## 2019-07-07 DIAGNOSIS — E785 Hyperlipidemia, unspecified: Secondary | ICD-10-CM

## 2019-07-07 DIAGNOSIS — R918 Other nonspecific abnormal finding of lung field: Secondary | ICD-10-CM | POA: Diagnosis not present

## 2019-07-07 DIAGNOSIS — J189 Pneumonia, unspecified organism: Secondary | ICD-10-CM

## 2019-07-07 LAB — LIPID PANEL
Chol/HDL Ratio: 2.5 ratio (ref 0.0–4.4)
Cholesterol, Total: 141 mg/dL (ref 100–199)
HDL: 57 mg/dL (ref >39)
LDL Chol Calc (NIH): 67 mg/dL (ref 0–99)
Triglycerides: 89 mg/dL (ref 0–149)
VLDL Cholesterol Cal: 17 mg/dL (ref 5–40)

## 2019-07-07 LAB — HEPATIC FUNCTION PANEL
ALT: 20 IU/L (ref 0–32)
AST: 25 IU/L (ref 0–40)
Albumin: 4.6 g/dL (ref 3.7–4.7)
Alkaline Phosphatase: 67 IU/L (ref 48–121)
Bilirubin Total: 0.4 mg/dL (ref 0.0–1.2)
Bilirubin, Direct: 0.14 mg/dL (ref 0.00–0.40)
Total Protein: 7 g/dL (ref 6.0–8.5)

## 2019-07-08 ENCOUNTER — Telehealth: Payer: Self-pay

## 2019-07-08 DIAGNOSIS — R911 Solitary pulmonary nodule: Secondary | ICD-10-CM

## 2019-07-08 DIAGNOSIS — R918 Other nonspecific abnormal finding of lung field: Secondary | ICD-10-CM

## 2019-07-08 NOTE — Telephone Encounter (Signed)
Put in referral to pulmonology.

## 2019-07-08 NOTE — Telephone Encounter (Signed)
-----   Message from Josue Hector, MD sent at 07/08/2019 10:05 AM EDT ----- Some changes to lung nodule have her see pulmonary ASAP may need further w/u

## 2019-07-14 NOTE — Progress Notes (Signed)
Cardiology Office Note    Date:  07/19/2019   ID:  Teresa Booker, DOB 05-01-1945, MRN [id number]  PCP:  [name], MD  Cardiologist: Jenkins Rouge, MD EPS: None   History of Present Illness:  Teresa Booker is a 74 y.o. female , partner of Teresa Booker, CMA in our office, with history of hyperlipidemia on statin, palpitations with Holter 04/18/2017 showing PVCs treated with Toprol, normal NST 05/06/2017 LVEF 70% echo 04/2017 LVEF 60 to 65% with anterior leaflet prolapse no significant MR  Seen by PA July 2020 with increased palpitations due to prednisone and excess caffeine intake Toprol dose increased   Now doing some better Getting use to taking higher dose Toprol in am with less fatigue Finds reading novels helps with her anxiety   Had COVID vaccine in February  Calcium score 04/05/19 109 which is 10 st percentile for age and sex isolated to LAD On crestor with LDL at goal 67 07/07/19 This showed nodule in superior segment of RLL F/U CT 07/07/19 with 2.3 x 1.8 cm nodule in same area with adjacent tenting of lung tissue She was referred to pulmonary   He significant other Teresa Booker recently has sepsis after breast reduction surgery  Patient to see Dr Valeta Harms on the 16 th will likely need biopsy or PET scan    Past Medical History:  Diagnosis Date  . Alcohol abuse    - abstemious 20 years. Attends AA - strong recovery   . Allergic reaction   . Allergy    seasonal  . Anxiety   . Fibromyalgia   . GERD (gastroesophageal reflux disease)   . Hyperlipemia   . IBS (irritable bowel syndrome)   . Postnasal drip   . RBBB (right bundle branch block)   . Rotator cuff syndrome    right    Past Surgical History:  Procedure Laterality Date  . COLONOSCOPY    . MOUTH SURGERY  2002  . ROTATOR CUFF REPAIR  10-24-05   had bone spur and laceration off cuff. Daldorf    Current Medications: Current Meds  Medication Sig  . aspirin 81 MG tablet Take 81 mg by mouth daily.  . Calcium 200 MG  TABS Take 1 tablet by mouth daily.   . Echinacea 125 MG CAPS Take 1 capsule by mouth daily.   . hydrOXYzine (ATARAX/VISTARIL) 25 MG tablet Take 1 tablet (25 mg total) by mouth every 8 (eight) hours as needed.  . hyoscyamine (LEVBID) 0.375 MG 12 hr tablet Take 1 tablet by mouth twice daily  . L-Lysine 500 MG TABS Take 500 mg by mouth daily.  Marland Kitchen loratadine (CLARITIN) 10 MG tablet Take 1 tablet (10 mg total) by mouth daily.  . Magnesium 250 MG TABS Take 250 mg by mouth daily.  . metoprolol succinate (TOPROL-XL) 50 MG 24 hr tablet Take 1 tablet (50 mg total) by mouth daily. Take with or immediately following a meal.  . Multiple Vitamin (MULTIVITAMIN) capsule Take 1 capsule by mouth daily.    Marland Kitchen omeprazole (PRILOSEC) 20 MG capsule Take 20 mg by mouth daily.    . rosuvastatin (CRESTOR) 10 MG tablet Take 1 tablet (10 mg total) by mouth at bedtime.     Allergies:   Codeine, Penicillins, Sulfonamide derivatives, and Aspirin   Social History   Socioeconomic History  . Marital status: Single    Spouse name: Not on file  . Number of children: 0  . Years of education: 16  . Highest  education level: Bachelor's degree (e.g., BA, AB, BS)  Occupational History  . Occupation: Freight forwarder    Comment: retired  Tobacco Use  . Smoking status: Former Smoker    Quit date: 03/12/1996    Years since quitting: 23.3  . Smokeless tobacco: Never Used  . Tobacco comment: quit appox 10 yrs ago  Substance and Sexual Activity  . Alcohol use: No    Alcohol/week: 0.0 standard drinks    Comment: former usuer AA 20+yrs ago strong recovery  . Drug use: No  . Sexual activity: Yes    Partners: Female  Other Topics Concern  . Not on file  Social History Narrative   03/30/19:   [employer]   Long-term relationship - '95   1 cat   Social Determinants of Health   Financial Resource Strain: Low Risk   . Difficulty of Paying Living Expenses: Not hard at all  Food Insecurity: No Food Insecurity  . Worried  About Charity fundraiser in the Last Year: Never true  . Ran Out of Food in the Last Year: Never true  Transportation Needs: No Transportation Needs  . Lack of Transportation (Medical): No  . Lack of Transportation (Non-Medical): No  Physical Activity: Sufficiently Active  . Days of Exercise per Week: 3 days  . Minutes of Exercise per Session: 60 min  Stress: Stress Concern Present  . Feeling of Stress : To some extent  Social Connections: Unknown  . Frequency of Communication with Friends and Family: More than three times a week  . Frequency of Social Gatherings with Friends and Family: Once a week  . Attends Religious Services: Never  . Active Member of Clubs or Organizations: Yes  . Attends Archivist Meetings: More than 4 times per year  . Marital Status: Not on file     Family History:  The patient's   family history includes Aneurysm in her mother and another family member; Cancer in her brother; Coronary artery disease in her mother; Diabetes in her brother; Heart attack in her mother; Hyperlipidemia in her mother; Irritable bowel syndrome in her mother; Lung cancer in an other family member; Stroke in an other family member.   ROS:   Please see the history of present illness.    ROS All other systems reviewed and are negative.   PHYSICAL EXAM:   VS:  BP 134/62   Pulse 71   Ht 5' 6.5" (1.689 m)   Wt 140 lb (63.5 kg)   SpO2 98%   BMI 22.26 kg/m    Affect appropriate Healthy:  appears stated age 74: normal Neck supple with no adenopathy JVP normal no bruits no thyromegaly Lungs clear with no wheezing and good diaphragmatic motion Heart:  S1/S2 no murmur, no rub, gallop or click PMI normal Abdomen: benighn, BS positve, no tenderness, no AAA no bruit.  No HSM or HJR Distal pulses intact with no bruits No edema Neuro non-focal Skin warm and dry No muscular weakness  Wt Readings from Last 3 Encounters:  07/19/19 140 lb (63.5 kg)  04/09/19 145 lb  (65.8 kg)  03/30/19 144 lb (65.3 kg)      Studies/Labs Reviewed:   EKG:   08/25/18 NSR rate 76 nonspecific ST changes with frequent PVC's  Recent Labs: 08/25/2018: BUN 17; Creatinine, Ser 1.02; Hemoglobin 12.4; Magnesium 2.3; Platelets 250; Potassium 4.7; Sodium 143; TSH 1.850 07/07/2019: ALT 20   Lipid Panel    Component Value Date/Time  CHOL 141 07/07/2019 0752   TRIG 89 07/07/2019 0752   HDL 57 07/07/2019 0752   CHOLHDL 2.5 07/07/2019 0752   CHOLHDL 2.7 10/05/2015 0758   VLDL 28 10/05/2015 0758   LDLCALC 67 07/07/2019 0752    Additional studies/ records that were reviewed today include:  NST 05/06/2017   Nuclear stress EF: 70%.  There was no ST segment deviation noted during stress.  Blood pressure demonstrated a hypertensive response to exercise.  The study is normal.  This is a low risk study.  The left ventricular ejection fraction is hyperdynamic (>65%).   Normal exercise nuclear stress test with no evidence for prior infarct or ischemia.  Normal LVEF. Poor exercise capacity. Hypertensive response to exercise.   2D echo 3/26/2019Study Conclusions   - Left ventricle: The cavity size was normal. Systolic function was   normal. The estimated ejection fraction was in the range of 60%   to 65%. Wall motion was normal; there were no regional wall   motion abnormalities. Left ventricular diastolic function   parameters were normal. - Mitral valve: Mild, late systolicprolapse, involving the anterior   leaflet. There was no significant regurgitation. - Pulmonary arteries: PA peak pressure: 32 mm Hg (S).   Holter monitor 04/2017 Sinus rhythm PAC;s/ PVC;s No significant arrhythmias       ASSESSMENT:    Palpitations, HLD, Pulmonary Nodule   PLAN:  In order of problems listed above:   Palpitations PVCs and PACs on Holter monitor 04/2017 worse in June 2020 on prednisone for bee sting and excess caffeine Better on higher dose Toprol .Labs Including  TSH/Hct/K and Cr normal 08/25/18   Hyperlipidemia on Crestor LDL  67 at goal  Pulmonary: change in right lower lobe nodule noted on recent CT F/U with pulmonary ASAP To see Dr Valeta Harms Note forwarded to him to ? expedite PET scan or further w/u    Medication Adjustments/Labs and Tests Ordered: Current medicines are reviewed at length with the patient today.  Concerns regarding medicines are outlined above.  Medication changes, Labs and Tests ordered today are listed in the Patient Instructions below. There are no Patient Instructions on file for this visit.   Signed, Jenkins Rouge, MD  07/19/2019 9:57 AM    [address], [place], [place]  [address] Phone: [phone]; Fax: [phone]

## 2019-07-15 DIAGNOSIS — R69 Illness, unspecified: Secondary | ICD-10-CM | POA: Diagnosis not present

## 2019-07-19 ENCOUNTER — Other Ambulatory Visit: Payer: Self-pay

## 2019-07-19 ENCOUNTER — Ambulatory Visit: Payer: Medicare HMO | Admitting: Cardiovascular Disease

## 2019-07-19 ENCOUNTER — Encounter: Payer: Self-pay | Admitting: Cardiovascular Disease

## 2019-07-19 VITALS — BP 134/62 | HR 71 | Ht 66.5 in | Wt 140.0 lb

## 2019-07-19 DIAGNOSIS — R002 Palpitations: Secondary | ICD-10-CM | POA: Diagnosis not present

## 2019-07-19 DIAGNOSIS — R911 Solitary pulmonary nodule: Secondary | ICD-10-CM

## 2019-07-19 NOTE — Patient Instructions (Signed)
Medication Instructions:  *If you need a refill on your cardiac medications before your next appointment, please call your pharmacy*  Lab Work: If you have labs (blood work) drawn today and your tests are completely normal, you will receive your results only by: Marland Kitchen MyChart Message (if you have MyChart) OR . A paper copy in the mail If you have any lab test that is abnormal or we need to change your treatment, we will call you to review the results.  Follow-Up: At [facility] you and your health needs are our priority.  As part of our continuing mission to provide you with exceptional heart care, we have created designated Provider Care Teams.  These Care Teams include your primary Cardiologist (physician) and Advanced Practice Providers (APPs -  Physician Assistants and Nurse Practitioners) who all work together to provide you with the care you need, when you need it.  We recommend signing up for the patient portal called "MyChart".  Sign up information is provided on this After Visit Summary.  MyChart is used to connect with patients for Virtual Visits (Telemedicine).  Patients are able to view lab/test results, encounter notes, upcoming appointments, etc.  Non-urgent messages can be sent to your provider as well.   To learn more about what you can do with MyChart, go to [email or web address].    Your next appointment:   6 month(s)  The format for your next appointment:   In Person  Provider:   You may see Jenkins Rouge, MD or one of the following Advanced Practice Providers on your designated Care Team:    Truitt Merle, NP  Cecilie Kicks, NP  Kathyrn Drown, NP

## 2019-07-27 ENCOUNTER — Other Ambulatory Visit: Payer: Self-pay

## 2019-07-27 ENCOUNTER — Ambulatory Visit: Payer: Medicare HMO | Admitting: Internal Medicine

## 2019-07-27 ENCOUNTER — Encounter: Payer: Self-pay | Admitting: Internal Medicine

## 2019-07-27 VITALS — BP 124/82 | HR 78 | Temp 98.2°F | Ht 66.5 in | Wt 139.0 lb

## 2019-07-27 DIAGNOSIS — R911 Solitary pulmonary nodule: Secondary | ICD-10-CM

## 2019-07-27 DIAGNOSIS — IMO0001 Reserved for inherently not codable concepts without codable children: Secondary | ICD-10-CM

## 2019-07-27 NOTE — Patient Instructions (Addendum)
-   Repeat CT in August then f/u with Dr. Valeta Harms - If nodule grows it probably needs to come out, if stable in size we can just watch.  We will get lung function tests in case it needs to come out. - I will send message to Dr. Johnsie Cancel regarding

## 2019-07-27 NOTE — Progress Notes (Signed)
Synopsis: Lung nodule  Assessment & Plan:  Problem 1 Subsolid nodule in smoker: stable size x 3 months.   Could be a BAC based on appearance vs. Inflammation/scarring.  Would just watch for now. - PFTs in case needs resection - Repeat CT in August (~6 mo from initial) with super D protocol, if stable can probably space out imaging; will have see Icard after next CT as if it grows probably warrants ENB  MDM . I reviewed prior external note(s) from Dr. Johnsie Cancel on 07/19/19 . I reviewed the result(s) of myocardial perfusion study on 05/06/17 showing no signs of ischemia . I have ordered repeat CT in August and f/u with Dr. Valeta Harms   Review of patient's CT chest 5/26 images reveal stable mostly almost scar-like RLL supseg nodule with pleural tenting as well as bi-apical pleural scarring. The patient's images have been independently reviewed by me.      End of visit medications:  Current Outpatient Medications:  .  aspirin 81 MG tablet, Take 81 mg by mouth daily., Disp: , Rfl:  .  Calcium 200 MG TABS, Take 1 tablet by mouth daily. , Disp: , Rfl:  .  Echinacea 125 MG CAPS, Take 1 capsule by mouth daily. , Disp: , Rfl:  .  hydrOXYzine (ATARAX/VISTARIL) 25 MG tablet, Take 1 tablet (25 mg total) by mouth every 8 (eight) hours as needed., Disp: 90 tablet, Rfl: 1 .  hyoscyamine (LEVBID) 0.375 MG 12 hr tablet, Take 1 tablet by mouth twice daily, Disp: 180 tablet, Rfl: 0 .  L-Lysine 500 MG TABS, Take 500 mg by mouth daily., Disp: , Rfl:  .  loratadine (CLARITIN) 10 MG tablet, Take 1 tablet (10 mg total) by mouth daily., Disp: 90 tablet, Rfl: 3 .  Magnesium 250 MG TABS, Take 250 mg by mouth daily., Disp: , Rfl:  .  metoprolol succinate (TOPROL-XL) 50 MG 24 hr tablet, Take 1 tablet (50 mg total) by mouth daily. Take with or immediately following a meal., Disp: 90 tablet, Rfl: 3 .  Multiple Vitamin (MULTIVITAMIN) capsule, Take 1 capsule by mouth daily.  , Disp: , Rfl:  .  omeprazole (PRILOSEC) 20 MG  capsule, Take 20 mg by mouth daily.  , Disp: , Rfl:  .  rosuvastatin (CRESTOR) 10 MG tablet, Take 1 tablet (10 mg total) by mouth at bedtime., Disp: 90 tablet, Rfl: 3   Candee Furbish, MD [facility] 07/27/2019 1:57 PM    Subjective:   PATIENT ID: Teresa Booker GENDER: female DOB: [birth year], MRN: [id number]  Chief Complaint  Patient presents with  . Consult    Referred by Dr. Johnsie Cancel for abnormal CT scans in February and May 2021.     HPI Here for lung nodule found after risk stratification coronary CT. History of ~30 pack years smoking, quit 23 years ago. MMRC 0 dyspnea. Only complaint is some abdominal pain with negative hepatic workup that is actually improving with chiropractic manipulation. Her daughter comes with her, she works with Dr. Johnsie Cancel, the referring physician.  Ancillary information including prior medications, full medical/surgical/family/social histoies, and PFTs (when available) are listed below and have been reviewed.   ROS + symptoms in bold Fevers, chills, weight loss Nausea, vomiting, diarrhea Shortness of breath, wheezing, cough Chest pain, palpitations, lower ext edema   Objective:   Vitals:   07/27/19 1325  BP: 124/82  Pulse: 78  Temp: 98.2 F (36.8 C)  TempSrc: Oral  SpO2: 98%  Weight: 139 lb (  63 kg)  Height: 5' 6.5" (1.689 m)   98% on RA BMI Readings from Last 3 Encounters:  07/27/19 22.10 kg/m  07/19/19 22.26 kg/m  04/09/19 24.13 kg/m   Wt Readings from Last 3 Encounters:  07/27/19 139 lb (63 kg)  07/19/19 140 lb (63.5 kg)  04/09/19 145 lb (65.8 kg)    GEN: well appearing woman in no acute distress HEENT: trachea midline, mucus membranes moist, no palpable adenopathy CV: Regular rate and rhythm, extremities are warm PULM: Clear, no wheezing GI: Soft, +BS EXT: no edema NEURO: Moves all 4 extremities   Ancillary Information    Past Medical History:  Diagnosis Date  . Alcohol abuse    - abstemious  20 years. Attends AA - strong recovery   . Allergic reaction   . Allergy    seasonal  . Anxiety   . Fibromyalgia   . GERD (gastroesophageal reflux disease)   . Hyperlipemia   . IBS (irritable bowel syndrome)   . Postnasal drip   . RBBB (right bundle branch block)   . Rotator cuff syndrome    right     Family History  Problem Relation Age of Onset  . Hyperlipidemia Mother   . Irritable bowel syndrome Mother   . Coronary artery disease Mother   . Heart attack Mother   . Aneurysm Mother        brain  . Diabetes Brother   . Cancer Brother        small cell lung ca with mets/ smoker  . Stroke Other   . Aneurysm Other   . Lung cancer Other   . Colon cancer Neg Hx      Past Surgical History:  Procedure Laterality Date  . COLONOSCOPY    . MOUTH SURGERY  2002  . ROTATOR CUFF REPAIR  10-24-05   had bone spur and laceration off cuff. Daldorf    Social History   Socioeconomic History  . Marital status: Single    Spouse name: Not on file  . Number of children: 0  . Years of education: 16  . Highest education level: Bachelor's degree (e.g., BA, AB, BS)  Occupational History  . Occupation: Freight forwarder    Comment: retired  Tobacco Use  . Smoking status: Former Smoker    Quit date: 03/12/1996    Years since quitting: 23.3  . Smokeless tobacco: Never Used  . Tobacco comment: quit appox 10 yrs ago  Vaping Use  . Vaping Use: Never used  Substance and Sexual Activity  . Alcohol use: No    Alcohol/week: 0.0 standard drinks    Comment: former usuer AA 20+yrs ago strong recovery  . Drug use: No  . Sexual activity: Yes    Partners: Female  Other Topics Concern  . Not on file  Social History Narrative   03/30/19:   [employer]   Long-term relationship - '95   1 cat   Social Determinants of Health   Financial Resource Strain: Low Risk   . Difficulty of Paying Living Expenses: Not hard at all  Food Insecurity: No Food Insecurity  . Worried About Ship broker in the Last Year: Never true  . Ran Out of Food in the Last Year: Never true  Transportation Needs: No Transportation Needs  . Lack of Transportation (Medical): No  . Lack of Transportation (Non-Medical): No  Physical Activity: Sufficiently Active  . Days of Exercise per Week: 3 days  . Minutes  of Exercise per Session: 60 min  Stress: Stress Concern Present  . Feeling of Stress : To some extent  Social Connections: Unknown  . Frequency of Communication with Friends and Family: More than three times a week  . Frequency of Social Gatherings with Friends and Family: Once a week  . Attends Religious Services: Never  . Active Member of Clubs or Organizations: Yes  . Attends Archivist Meetings: More than 4 times per year  . Marital Status: Not on file  Intimate Partner Violence:   . Fear of Current or Ex-Partner:   . Emotionally Abused:   Marland Kitchen Physically Abused:   . Sexually Abused:      Allergies  Allergen Reactions  . Codeine     Makes heart race  . Penicillins     unknown  . Sulfonamide Derivatives     Has a rash with the use of silvadiene for burn  . Aspirin     Upset stomach, High dose only     CBC    Component Value Date/Time   WBC 7.5 08/25/2018 1043   WBC 9.5 01/02/2018 1408   RBC 4.05 08/25/2018 1043   RBC 3.98 01/02/2018 1408   HGB 12.4 08/25/2018 1043   HCT 37.8 08/25/2018 1043   PLT 250 08/25/2018 1043   MCV 93 08/25/2018 1043   MCH 30.6 08/25/2018 1043   MCHC 32.8 08/25/2018 1043   MCHC 33.5 01/02/2018 1408   RDW 12.3 08/25/2018 1043   LYMPHSABS 2.8 01/02/2018 1408   MONOABS 0.5 01/02/2018 1408   EOSABS 0.1 01/02/2018 1408   BASOSABS 0.0 01/02/2018 1408    Pulmonary Functions Testing Results: No flowsheet data found.  Outpatient Medications Prior to Visit  Medication Sig Dispense Refill  . aspirin 81 MG tablet Take 81 mg by mouth daily.    . Calcium 200 MG TABS Take 1 tablet by mouth daily.     . Echinacea 125 MG CAPS Take 1  capsule by mouth daily.     . hydrOXYzine (ATARAX/VISTARIL) 25 MG tablet Take 1 tablet (25 mg total) by mouth every 8 (eight) hours as needed. 90 tablet 1  . hyoscyamine (LEVBID) 0.375 MG 12 hr tablet Take 1 tablet by mouth twice daily 180 tablet 0  . L-Lysine 500 MG TABS Take 500 mg by mouth daily.    Marland Kitchen loratadine (CLARITIN) 10 MG tablet Take 1 tablet (10 mg total) by mouth daily. 90 tablet 3  . Magnesium 250 MG TABS Take 250 mg by mouth daily.    . metoprolol succinate (TOPROL-XL) 50 MG 24 hr tablet Take 1 tablet (50 mg total) by mouth daily. Take with or immediately following a meal. 90 tablet 3  . Multiple Vitamin (MULTIVITAMIN) capsule Take 1 capsule by mouth daily.      Marland Kitchen omeprazole (PRILOSEC) 20 MG capsule Take 20 mg by mouth daily.      . rosuvastatin (CRESTOR) 10 MG tablet Take 1 tablet (10 mg total) by mouth at bedtime. 90 tablet 3   No facility-administered medications prior to visit.

## 2019-07-28 ENCOUNTER — Institutional Professional Consult (permissible substitution): Payer: Medicare HMO | Admitting: Pulmonary Disease

## 2019-08-09 ENCOUNTER — Other Ambulatory Visit: Payer: Self-pay | Admitting: Obstetrics & Gynecology

## 2019-08-09 DIAGNOSIS — Z1231 Encounter for screening mammogram for malignant neoplasm of breast: Secondary | ICD-10-CM

## 2019-08-24 NOTE — Telephone Encounter (Signed)
PCC's can you check status of appt for CT/Super D?

## 2019-08-31 ENCOUNTER — Other Ambulatory Visit: Payer: Self-pay | Admitting: Family Medicine

## 2019-09-13 ENCOUNTER — Ambulatory Visit (INDEPENDENT_AMBULATORY_CARE_PROVIDER_SITE_OTHER): Payer: Medicare HMO | Admitting: Internal Medicine

## 2019-09-13 ENCOUNTER — Other Ambulatory Visit: Payer: Self-pay

## 2019-09-13 DIAGNOSIS — IMO0001 Reserved for inherently not codable concepts without codable children: Secondary | ICD-10-CM

## 2019-09-13 DIAGNOSIS — R911 Solitary pulmonary nodule: Secondary | ICD-10-CM

## 2019-09-13 LAB — PULMONARY FUNCTION TEST
DL/VA % pred: 102 %
DL/VA: 4.16 ml/min/mmHg/L
DLCO cor % pred: 88 %
DLCO cor: 18.24 ml/min/mmHg
DLCO unc % pred: 88 %
DLCO unc: 18.24 ml/min/mmHg
FEF 25-75 Post: 1.41 L/sec
FEF 25-75 Pre: 1.07 L/sec
FEF2575-%Change-Post: 31 %
FEF2575-%Pred-Post: 73 %
FEF2575-%Pred-Pre: 55 %
FEV1-%Change-Post: 10 %
FEV1-%Pred-Post: 81 %
FEV1-%Pred-Pre: 73 %
FEV1-Post: 1.95 L
FEV1-Pre: 1.76 L
FEV1FVC-%Change-Post: 0 %
FEV1FVC-%Pred-Pre: 80 %
FEV6-%Change-Post: 10 %
FEV6-%Pred-Post: 103 %
FEV6-%Pred-Pre: 93 %
FEV6-Post: 3.13 L
FEV6-Pre: 2.84 L
FEV6FVC-%Change-Post: 0 %
FEV6FVC-%Pred-Post: 104 %
FEV6FVC-%Pred-Pre: 104 %
FVC-%Change-Post: 9 %
FVC-%Pred-Post: 99 %
FVC-%Pred-Pre: 90 %
FVC-Post: 3.17 L
FVC-Pre: 2.89 L
Post FEV1/FVC ratio: 61 %
Post FEV6/FVC ratio: 100 %
Pre FEV1/FVC ratio: 61 %
Pre FEV6/FVC Ratio: 100 %
RV % pred: 91 %
RV: 2.14 L
TLC % pred: 99 %
TLC: 5.32 L

## 2019-09-13 NOTE — Progress Notes (Signed)
PFT completed today.  

## 2019-09-15 ENCOUNTER — Ambulatory Visit (INDEPENDENT_AMBULATORY_CARE_PROVIDER_SITE_OTHER)
Admission: RE | Admit: 2019-09-15 | Discharge: 2019-09-15 | Disposition: A | Discharge status: Home / Self Care | Payer: Medicare HMO | Source: Ambulatory Visit | Attending: Internal Medicine | Admitting: Internal Medicine

## 2019-09-15 ENCOUNTER — Other Ambulatory Visit: Payer: Self-pay

## 2019-09-15 DIAGNOSIS — J432 Centrilobular emphysema: Secondary | ICD-10-CM | POA: Diagnosis not present

## 2019-09-15 DIAGNOSIS — R911 Solitary pulmonary nodule: Secondary | ICD-10-CM

## 2019-09-15 DIAGNOSIS — I2510 Atherosclerotic heart disease of native coronary artery without angina pectoris: Secondary | ICD-10-CM | POA: Diagnosis not present

## 2019-09-15 DIAGNOSIS — J984 Other disorders of lung: Secondary | ICD-10-CM | POA: Diagnosis not present

## 2019-09-15 DIAGNOSIS — IMO0001 Reserved for inherently not codable concepts without codable children: Secondary | ICD-10-CM

## 2019-09-15 DIAGNOSIS — I700 Atherosclerosis of aorta: Secondary | ICD-10-CM | POA: Diagnosis not present

## 2019-09-23 ENCOUNTER — Other Ambulatory Visit: Payer: Self-pay

## 2019-09-23 ENCOUNTER — Ambulatory Visit
Admission: RE | Admit: 2019-09-23 | Discharge: 2019-09-23 | Disposition: A | Discharge status: Home / Self Care | Payer: Medicare HMO | Source: Ambulatory Visit | Attending: Obstetrics & Gynecology | Admitting: Obstetrics & Gynecology

## 2019-09-23 DIAGNOSIS — Z1231 Encounter for screening mammogram for malignant neoplasm of breast: Secondary | ICD-10-CM

## 2019-09-28 ENCOUNTER — Other Ambulatory Visit: Payer: Self-pay | Admitting: Physician Assistant

## 2019-09-29 ENCOUNTER — Ambulatory Visit: Payer: Medicare HMO | Admitting: Adult Health

## 2019-09-29 ENCOUNTER — Other Ambulatory Visit: Payer: Self-pay

## 2019-09-29 ENCOUNTER — Encounter: Payer: Self-pay | Admitting: Adult Health

## 2019-09-29 VITALS — BP 138/64 | HR 72 | Temp 98.2°F | Ht 66.0 in | Wt 140.6 lb

## 2019-09-29 DIAGNOSIS — R911 Solitary pulmonary nodule: Secondary | ICD-10-CM | POA: Diagnosis not present

## 2019-09-29 DIAGNOSIS — IMO0001 Reserved for inherently not codable concepts without codable children: Secondary | ICD-10-CM

## 2019-09-29 NOTE — Progress Notes (Addendum)
@Patient  ID: Teresa Booker, female    DOB: 26-Mar-1945, 74 y.o.   MRN: [id number]  Chief Complaint  Patient presents with  . Follow-up    Referring provider: [name], MD  HPI: 74 year old female former smoker seen for pulmonary consult for lung nodule July 27, 2019  TEST/EVENTS :  06/2019 Subsolid 2.3 cm pulmonary nodule in the superior segment right lower lobe, unchanged since 04/05/2019  09/15/19 CT chest solid nodule in the superior segment of the right lower lobe measures approximately 2.2 x 1.5 cm   09/29/2019 Follow up : Lung nodule  Patient presents for a 39-month follow-up.  Patient has a known pulmonary nodule noted on CT scan.  She had a follow-up CT in May 2021 that showed a subsolid 2.3 cm pulmonary nodule in the right lower lobe.  Follow-up serial CT September 15, 2019 showed no significant change in a 2.2 x 1.5 cm solid nodule in the right lower lobe. Patient was set up for pulmonary function testing that showed mild airflow obstruction with FEV1 at 81%, ratio 61, FVC 99%, mid flow reversibility, DLCO 88%. Patient says she has no increased shortness of breath.  No cough. I went over her test results with her and her significant other.  Long discussion regarding options.  We will proceed with a PET scan. She denies any hemoptysis, weight loss. Patient is independent.  Has some shortness of breath with heavy activity.- :late add 10/05/19 patient emailed and says no dyspnea with heavy activity, correction noted.     Allergies  Allergen Reactions  . Codeine     Makes heart race  . Penicillins     unknown  . Sulfonamide Derivatives     Has a rash with the use of silvadiene for burn  . Aspirin     Upset stomach, High dose only    Immunization History  Administered Date(s) Administered  . Influenza Split 11/29/2010  . Influenza, High Dose Seasonal PF 11/27/2018  . Influenza,inj,Quad PF,6+ Mos 01/11/2014, 12/06/2016  . Influenza-Unspecified 12/22/2014,  12/19/2015, 11/26/2017  . PFIZER SARS-COV-2 Vaccination 03/18/2019, 04/12/2019  . Pneumococcal Polysaccharide-23 01/23/2009  . Td 01/23/2009, 11/20/2018  . Zoster 07/10/2010    Past Medical History:  Diagnosis Date  . Alcohol abuse    - abstemious 20 years. Attends AA - strong recovery   . Allergic reaction   . Allergy    seasonal  . Anxiety   . Fibromyalgia   . GERD (gastroesophageal reflux disease)   . Hyperlipemia   . IBS (irritable bowel syndrome)   . Postnasal drip   . RBBB (right bundle branch block)   . Rotator cuff syndrome    right    Tobacco History: Social History   Tobacco Use  Smoking Status Former Smoker  . Quit date: 03/12/1996  . Years since quitting: 23.5  Smokeless Tobacco Never Used  Tobacco Comment   quit appox 10 yrs ago   Counseling given: Not Answered Comment: quit appox 10 yrs ago   Outpatient Medications Prior to Visit  Medication Sig Dispense Refill  . aspirin 81 MG tablet Take 81 mg by mouth daily.    . Calcium 200 MG TABS Take 1 tablet by mouth daily.     . Echinacea 125 MG CAPS Take 1 capsule by mouth daily.     . hydrOXYzine (ATARAX/VISTARIL) 25 MG tablet Take 1 tablet (25 mg total) by mouth every 8 (eight) hours as needed. 90 tablet 1  . hyoscyamine (LEVBID) 0.375 MG  12 hr tablet Take 1 tablet by mouth twice daily 180 tablet 0  . L-Lysine 500 MG TABS Take 500 mg by mouth daily.    Marland Kitchen loratadine (CLARITIN) 10 MG tablet Take 1 tablet (10 mg total) by mouth daily. 90 tablet 3  . Magnesium 250 MG TABS Take 250 mg by mouth daily.    . metoprolol succinate (TOPROL-XL) 50 MG 24 hr tablet Take 1 tablet (50 mg total) by mouth daily. Take with or immediately following a meal. 90 tablet 3  . Multiple Vitamin (MULTIVITAMIN) capsule Take 1 capsule by mouth daily.      Marland Kitchen omeprazole (PRILOSEC) 20 MG capsule Take 20 mg by mouth daily.      . rosuvastatin (CRESTOR) 10 MG tablet Take 1 tablet (10 mg total) by mouth at bedtime. 90 tablet 3   No  facility-administered medications prior to visit.     Review of Systems:   Constitutional:   No  weight loss, night sweats,  Fevers, chills, fatigue, or  lassitude.  HEENT:   No headaches,  Difficulty swallowing,  Tooth/dental problems, or  Sore throat,                No sneezing, itching, ear ache, nasal congestion, post nasal drip,   CV:  No chest pain,  Orthopnea, PND, swelling in lower extremities, anasarca, dizziness, palpitations, syncope.   GI  No heartburn, indigestion, abdominal pain, nausea, vomiting, diarrhea, change in bowel habits, loss of appetite, bloody stools.   Resp: No shortness of breath with exertion or at rest.  No excess mucus, no productive cough,  No non-productive cough,  No coughing up of blood.  No change in color of mucus.  No wheezing.  No chest wall deformity  Skin: no rash or lesions.  GU: no dysuria, change in color of urine, no urgency or frequency.  No flank pain, no hematuria   MS:  No joint pain or swelling.  No decreased range of motion.  No back pain.    Physical Exam  BP 138/64 (BP Location: Left Arm, Cuff Size: Normal)   Pulse 72   Temp 98.2 F (36.8 C) (Temporal)   Ht 5\' 6"  (1.676 m)   Wt 140 lb 9.6 oz (63.8 kg)   SpO2 100% Comment: RA  BMI 22.69 kg/m   GEN: A/Ox3; pleasant , NAD, well nourished    HEENT:  [place]/AT,    NOSE-clear, THROAT-clear, no lesions, no postnasal drip or exudate noted.   NECK:  Supple w/ fair ROM; no JVD; normal carotid impulses w/o bruits; no thyromegaly or nodules palpated; no lymphadenopathy.    RESP  Clear  P & A; w/o, wheezes/ rales/ or rhonchi. no accessory muscle use, no dullness to percussion  CARD:  RRR, no m/r/g, no peripheral edema, pulses intact, no cyanosis or clubbing.  GI:   Soft & nt; nml bowel sounds; no organomegaly or masses detected.   Musco: Warm bil, no deformities or joint swelling noted.   Neuro: alert, no focal deficits noted.    Skin: Warm, no lesions or rashes    Lab  Results:  CBC    Component Value Date/Time   WBC 7.5 08/25/2018 1043   WBC 9.5 01/02/2018 1408   RBC 4.05 08/25/2018 1043   RBC 3.98 01/02/2018 1408   HGB 12.4 08/25/2018 1043   HCT 37.8 08/25/2018 1043   PLT 250 08/25/2018 1043   MCV 93 08/25/2018 1043   MCH 30.6 08/25/2018 1043   MCHC 32.8  08/25/2018 1043   MCHC 33.5 01/02/2018 1408   RDW 12.3 08/25/2018 1043   LYMPHSABS 2.8 01/02/2018 1408   MONOABS 0.5 01/02/2018 1408   EOSABS 0.1 01/02/2018 1408   BASOSABS 0.0 01/02/2018 1408    BMET    Component Value Date/Time   NA 143 08/25/2018 1043   K 4.7 08/25/2018 1043   CL 103 08/25/2018 1043   CO2 25 08/25/2018 1043   GLUCOSE 117 (H) 08/25/2018 1043   GLUCOSE 98 01/02/2018 1408   BUN 17 08/25/2018 1043   CREATININE 1.02 (H) 08/25/2018 1043   CALCIUM 9.8 08/25/2018 1043   GFRNONAA 55 (L) 08/25/2018 1043   GFRAA 64 08/25/2018 1043    BNP No results found for: BNP  ProBNP    Component Value Date/Time   PROBNP 68.0 06/07/2011 0900    Imaging: MM 3D SCREEN BREAST BILATERAL  Result Date: 09/23/2019 CLINICAL DATA:  Screening. EXAM: DIGITAL SCREENING BILATERAL MAMMOGRAM WITH TOMO AND CAD COMPARISON:  Previous exam(s). ACR Breast Density Category c: The breast tissue is heterogeneously dense, which may obscure small masses. FINDINGS: There are no findings suspicious for malignancy. Images were processed with CAD. IMPRESSION: No mammographic evidence of malignancy. A result letter of this screening mammogram will be mailed directly to the patient. RECOMMENDATION: Screening mammogram in one year. (Code:[id number]-B-01Y) BI-RADS CATEGORY  1: Negative. Electronically Signed   By: Ammie Ferrier M.D.   On: 09/23/2019 13:52   CT Super D Chest Wo Contrast  Result Date: 09/15/2019 CLINICAL DATA:  History of pulmonary nodule.  Planned bronchoscopy. EXAM: CT CHEST WITHOUT CONTRAST TECHNIQUE: Multidetector CT imaging of the chest was performed using thin slice collimation for  electromagnetic bronchoscopy planning purposes, without intravenous contrast. COMPARISON:  Chest CT 07/07/2019 and cardiac CT 04/05/2019. FINDINGS: Cardiovascular: Atherosclerosis of the aorta, great vessels and coronary arteries. No acute vascular findings on noncontrast imaging. The heart size is normal. There is no pericardial effusion. Mediastinum/Nodes: There are no enlarged mediastinal, hilar or axillary lymph nodes.Hilar assessment is limited by the lack of intravenous contrast, although the hilar contours appear unchanged. The thyroid gland, trachea and esophagus demonstrate no significant findings. Lungs/Pleura: No pleural effusion or pneumothorax. Stable mild centrilobular emphysema and biapical scarring. The part solid nodule in the superior segment of the right lower lobe measures approximately 2.2 x 1.5 cm on image 60/3, not significantly changed from comparison study. There are no increasingly solid components. Other scattered tiny ground-glass nodules in both lungs are stable. No new or enlarging nodules. Upper abdomen: The visualized upper abdomen appears stable without significant findings. Musculoskeletal/Chest wall: There is no chest wall mass or suspicious osseous finding. IMPRESSION: 1. Imaging for bronchoscopy planning. The part solid nodule in the superior segment of the right lower lobe is unchanged from comparison study. No new or enlarging nodules. 2. No adenopathy or pleural effusion. 3. Aortic Atherosclerosis (ICD10-I70.0) and Emphysema (ICD10-J43.9). Electronically Signed   By: Richardean Sale M.D.   On: 09/15/2019 14:56      PFT Results Latest Ref Rng & Units 09/13/2019  FVC-Pre L 2.89  FVC-Predicted Pre % 90  FVC-Post L 3.17  FVC-Predicted Post % 99  Pre FEV1/FVC % % 61  Post FEV1/FCV % % 61  FEV1-Pre L 1.76  FEV1-Predicted Pre % 73  FEV1-Post L 1.95  DLCO uncorrected ml/min/mmHg 18.24  DLCO UNC% % 88  DLCO corrected ml/min/mmHg 18.24  DLCO COR %Predicted % 88  DLVA  Predicted % 102  TLC L 5.32  TLC % Predicted %  99  RV % Predicted % 91    No results found for: NITRICOXIDE      Assessment & Plan:   No problem-specific Assessment & Plan notes found for this encounter.     Rexene Edison, NP 09/29/2019

## 2019-09-29 NOTE — Patient Instructions (Addendum)
Set up for PET scan for RLL Nodule.  Follow up with Dr. Valeta Harms in 4 weeks and As needed

## 2019-10-01 DIAGNOSIS — R911 Solitary pulmonary nodule: Secondary | ICD-10-CM | POA: Insufficient documentation

## 2019-10-01 NOTE — Assessment & Plan Note (Addendum)
2.2 cm right lower lobe nodule stable on CT chest since February 2021.  Former smoker  Discussed options for continued follow-up.  We will proceed with a PET scan PFTs show mild airflow obstruction only.  Patient has no significant shortness of breath.

## 2019-10-04 NOTE — Telephone Encounter (Signed)
Patient is requesting her chart be corrected.  See MyChart message below.  In the notes it says: HPI: "74 year old female active smoker seen for pulmonary consult for lung nodule July 27, 2019.  Has some shortness of breath with heavy activity."  Also says Dr Elease Hashimoto was referring MD.  Dr Elease Hashimoto is my primary but Dr Johnsie Cancel is the referring.   Rosana Berger, CMA  Latanya Presser 4 hours ago (12:43 PM)   Romilda Garret, I am looking at your copy of your after visit summary and do not see what you are referring to. Can you tell me where you see this information? We do have you listed as a former smoker, not a current smoker. I do not see mention of the shortness of breath symptom. We will be happy to help you with this, just need to know what information you are looking at.    Orie Rout, NP 3 days ago   It indicates I am an active smoker,  That is incorrect.  I advised and it was previously noted on my file that I stopped smoking over 20 years ago.  It says some shortness of breath with heavy activity.   I did not say that either.  I have not had shortness of breath.  I would having the after visit summary corrected.  Thank you

## 2019-10-04 NOTE — Progress Notes (Signed)
Thanks for seeing her Darvin Neighbours [facility] Pulmonary Critical Care 10/04/2019 6:49 PM

## 2019-10-05 NOTE — Telephone Encounter (Signed)
Sorry about the smoking , was suppose to say former smoker, Error corrected.   I addended the note to say not dyspnea with activity .   Anything else?

## 2019-10-08 ENCOUNTER — Other Ambulatory Visit: Payer: Self-pay

## 2019-10-08 ENCOUNTER — Ambulatory Visit (HOSPITAL_COMMUNITY)
Admission: RE | Admit: 2019-10-08 | Discharge: 2019-10-08 | Disposition: A | Discharge status: Home / Self Care | Payer: Medicare HMO | Source: Ambulatory Visit | Attending: Adult Health | Admitting: Adult Health

## 2019-10-08 DIAGNOSIS — IMO0001 Reserved for inherently not codable concepts without codable children: Secondary | ICD-10-CM

## 2019-10-08 DIAGNOSIS — I700 Atherosclerosis of aorta: Secondary | ICD-10-CM | POA: Diagnosis not present

## 2019-10-08 DIAGNOSIS — R911 Solitary pulmonary nodule: Secondary | ICD-10-CM | POA: Insufficient documentation

## 2019-10-08 DIAGNOSIS — I2510 Atherosclerotic heart disease of native coronary artery without angina pectoris: Secondary | ICD-10-CM | POA: Diagnosis not present

## 2019-10-08 DIAGNOSIS — K224 Dyskinesia of esophagus: Secondary | ICD-10-CM | POA: Diagnosis not present

## 2019-10-08 LAB — GLUCOSE, CAPILLARY: Glucose-Capillary: 102 mg/dL — ABNORMAL HIGH (ref 70–99)

## 2019-10-08 MED ORDER — FLUDEOXYGLUCOSE F - 18 (FDG) INJECTION
6.9500 | Freq: Once | INTRAVENOUS | Status: AC | PRN
  Administered 2019-10-08: 6.95 via INTRAVENOUS

## 2019-10-11 ENCOUNTER — Encounter: Payer: Self-pay | Admitting: Thoracic Surgery (Cardiothoracic Vascular Surgery)

## 2019-10-11 ENCOUNTER — Other Ambulatory Visit: Payer: Self-pay

## 2019-10-11 ENCOUNTER — Institutional Professional Consult (permissible substitution): Payer: Medicare HMO | Admitting: Thoracic Surgery (Cardiothoracic Vascular Surgery)

## 2019-10-11 VITALS — BP 150/77 | HR 82 | Temp 97.9°F | Resp 20 | Ht 66.0 in | Wt 141.0 lb

## 2019-10-11 DIAGNOSIS — R911 Solitary pulmonary nodule: Secondary | ICD-10-CM

## 2019-10-11 NOTE — Progress Notes (Signed)
PCP is Burchette, Alinda Sierras, MD Referring Provider is Parrett, Fonnie Mu, NP  Chief Complaint  Patient presents with  . Lung Lesion    Surgical consult, PET Scan 10/08/19, Chest CT-super D 09/15/19, PFT's 09/13/19    HPI: Teresa Booker presents for consultation regarding a right lower lobe lung nodule  Teresa Booker is a 74 year old former smoker with a history of fibromyalgia, reflux, hyperlipidemia, irritable bowel syndrome, remote alcohol abuse, and anxiety.  She had a CT done for coronary calcium score back in February.  She was noted to have a 13 x 19 mm groundglass nodularity in the superior segment of the right lower lobe.  A follow-up CT of the chest to more accurately measured the nodule 2.3 x 1.8 cm.  The nodule had mixed density but was primarily subsolid.  A repeat CT was done in August which showed the nodule had not changed significantly.  A PET/CT showed mild metabolic activity with an SUV of 2.1.  She smoked about a pack of cigarettes daily starting at age 75.  She quit 20 years ago.  Overall about a 30-pack-year history.  She is not having any cough, hemoptysis, wheezing, chest pain, tightness, pressure, or shortness of breath with exertion.  Her appetite is good.  She has lost about 5 pounds over the past 3 months with dietary changes.  She is retired.  She lives with her domestic partner.  She has been very anxious about this nodule and each scan as it is done.  Zubrod Score: At the time of surgery this patient's most appropriate activity status/level should be described as: [x]     0    Normal activity, no symptoms []     1    Restricted in physical strenuous activity but ambulatory, able to do out light work []     2    Ambulatory and capable of self care, unable to do work activities, up and about >50 % of waking hours                              []     3    Only limited self care, in bed greater than 50% of waking hours []     4    Completely disabled, no self care, confined to bed or chair []      5    Moribund  Past Medical History:  Diagnosis Date  . Alcohol abuse    - abstemious 20 years. Attends AA - strong recovery   . Allergic reaction   . Allergy    seasonal  . Anxiety   . Fibromyalgia   . GERD (gastroesophageal reflux disease)   . Hyperlipemia   . IBS (irritable bowel syndrome)   . Postnasal drip   . RBBB (right bundle branch block)   . Rotator cuff syndrome    right    Past Surgical History:  Procedure Laterality Date  . COLONOSCOPY    . MOUTH SURGERY  2002  . ROTATOR CUFF REPAIR  10-24-05   had bone spur and laceration off cuff. Daldorf    Family History  Problem Relation Age of Onset  . Hyperlipidemia Mother   . Irritable bowel syndrome Mother   . Coronary artery disease Mother   . Heart attack Mother   . Aneurysm Mother        brain  . Diabetes Brother   . Cancer Brother        small  cell lung ca with mets/ smoker  . Stroke Other   . Aneurysm Other   . Lung cancer Other   . Colon cancer Neg Hx     Social History Social History   Tobacco Use  . Smoking status: Former Smoker    Quit date: 03/12/1996    Years since quitting: 23.5  . Smokeless tobacco: Never Used  . Tobacco comment: quit appox 10 yrs ago  Vaping Use  . Vaping Use: Never used  Substance Use Topics  . Alcohol use: No    Alcohol/week: 0.0 standard drinks    Comment: former usuer AA 20+yrs ago strong recovery  . Drug use: No    Current Outpatient Medications  Medication Sig Dispense Refill  . aspirin 81 MG tablet Take 81 mg by mouth daily.    . Calcium 200 MG TABS Take 1 tablet by mouth daily.     . Echinacea 125 MG CAPS Take 1 capsule by mouth daily.     . hydrOXYzine (ATARAX/VISTARIL) 25 MG tablet Take 1 tablet (25 mg total) by mouth every 8 (eight) hours as needed. 90 tablet 1  . hyoscyamine (LEVBID) 0.375 MG 12 hr tablet Take 1 tablet by mouth twice daily 180 tablet 0  . L-Lysine 500 MG TABS Take 500 mg by mouth daily.    Marland Kitchen loratadine (CLARITIN) 10 MG tablet Take  1 tablet (10 mg total) by mouth daily. 90 tablet 3  . Magnesium 250 MG TABS Take 250 mg by mouth daily.    . metoprolol succinate (TOPROL-XL) 50 MG 24 hr tablet TAKE 1 TABLET BY MOUTH ONCE DAILY WITH A MEAL OR  IMMEDIATELY  FOLLOWING  A  MEAL 90 tablet 3  . Multiple Vitamin (MULTIVITAMIN) capsule Take 1 capsule by mouth daily.      Marland Kitchen omeprazole (PRILOSEC) 20 MG capsule Take 20 mg by mouth daily.      . rosuvastatin (CRESTOR) 10 MG tablet Take 1 tablet (10 mg total) by mouth at bedtime. 90 tablet 3   No current facility-administered medications for this visit.    Allergies  Allergen Reactions  . Codeine     Makes heart race  . Penicillins     unknown  . Sulfonamide Derivatives     Has a rash with the use of silvadiene for burn  . Aspirin     Upset stomach, High dose only    Review of Systems  Constitutional: Negative for activity change, appetite change and unexpected weight change (Lost 5 pounds in 3 months with diet).  HENT: Negative for trouble swallowing and voice change.   Eyes: Negative for visual disturbance.  Respiratory: Negative for cough, chest tightness, shortness of breath and wheezing.   Cardiovascular: Positive for palpitations. Negative for chest pain and leg swelling.  Gastrointestinal: Positive for abdominal pain (Right upper quadrant pain.). Negative for abdominal distention.  Genitourinary: Negative for difficulty urinating and dysuria.  Musculoskeletal: Negative for arthralgias and myalgias.  Neurological: Negative for seizures, syncope and weakness.  Hematological: Negative for adenopathy. Does not bruise/bleed easily.  Psychiatric/Behavioral: The patient is nervous/anxious.     BP (!) 150/77   Pulse 82   Temp 97.9 F (36.6 C) (Skin)   Resp 20   Ht 5\' 6"  (1.676 m)   Wt 141 lb (64 kg)   SpO2 96% Comment: RA  BMI 22.76 kg/m  Physical Exam Vitals reviewed.  Constitutional:      General: She is not in acute distress.    Appearance: Normal  appearance.   HENT:     Head: Normocephalic and atraumatic.  Eyes:     General: No scleral icterus.    Extraocular Movements: Extraocular movements intact.  Cardiovascular:     Rate and Rhythm: Normal rate and regular rhythm.     Pulses: Normal pulses.     Heart sounds: Normal heart sounds. No murmur heard.  No friction rub. No gallop.   Pulmonary:     Effort: Pulmonary effort is normal. No respiratory distress.     Breath sounds: Normal breath sounds. No wheezing or rales.  Abdominal:     General: There is no distension.     Palpations: Abdomen is soft.     Tenderness: There is no abdominal tenderness.  Musculoskeletal:        General: No swelling.  Lymphadenopathy:     Cervical: No cervical adenopathy.  Skin:    General: Skin is warm and dry.  Neurological:     General: No focal deficit present.     Mental Status: She is alert and oriented to person, place, and time.     Cranial Nerves: No cranial nerve deficit.     Motor: No weakness.    Diagnostic Tests: CT CHEST WITHOUT CONTRAST  TECHNIQUE: Multidetector CT imaging of the chest was performed using thin slice collimation for electromagnetic bronchoscopy planning purposes, without intravenous contrast.  COMPARISON:  Chest CT 07/07/2019 and cardiac CT 04/05/2019.  FINDINGS: Cardiovascular: Atherosclerosis of the aorta, great vessels and coronary arteries. No acute vascular findings on noncontrast imaging. The heart size is normal. There is no pericardial effusion.  Mediastinum/Nodes: There are no enlarged mediastinal, hilar or axillary lymph nodes.Hilar assessment is limited by the lack of intravenous contrast, although the hilar contours appear unchanged. The thyroid gland, trachea and esophagus demonstrate no significant findings.  Lungs/Pleura: No pleural effusion or pneumothorax. Stable mild centrilobular emphysema and biapical scarring. The part solid nodule in the superior segment of the right lower lobe  measures approximately 2.2 x 1.5 cm on image 60/3, not significantly changed from comparison study. There are no increasingly solid components. Other scattered tiny ground-glass nodules in both lungs are stable. No new or enlarging nodules.  Upper abdomen: The visualized upper abdomen appears stable without significant findings.  Musculoskeletal/Chest wall: There is no chest wall mass or suspicious osseous finding.  IMPRESSION: 1. Imaging for bronchoscopy planning. The part solid nodule in the superior segment of the right lower lobe is unchanged from comparison study. No new or enlarging nodules. 2. No adenopathy or pleural effusion. 3. Aortic Atherosclerosis (ICD10-I70.0) and Emphysema (ICD10-J43.9).   Electronically Signed   By: Richardean Sale M.D.   On: 09/15/2019 14:56 NUCLEAR MEDICINE PET SKULL BASE TO THIGH  TECHNIQUE: 6.95 mCi F-18 FDG was injected intravenously. Full-ring PET imaging was performed from the skull base to thigh after the radiotracer. CT data was obtained and used for attenuation correction and anatomic localization.  Fasting blood glucose: 102 mg/dl  COMPARISON:  Chest CTs from August 4th and Jul 07, 2019  FINDINGS: Mediastinal blood pool activity: SUV max 2.9  Liver activity: SUV max NA  NECK: No hypermetabolic lymph nodes in the neck.  Incidental CT findings: none  CHEST: Area of concern in the RIGHT posterior chest is not significantly changed compared to the recent CT evaluation of September 15, 2019 displaying part solid features as before. (Image 64, series 4) (SUVmax = 2.1) no additional suspicious nodules. Airways are patent. No mediastinal lymphadenopathy. No additional areas of suspicion in  the chest or hypermetabolic lesions.  Incidental CT findings: Calcified atheromatous plaque of the thoracic aorta. Calcified coronary artery disease. Normal heart size. No pericardial effusion. Patulous esophagus may indicate  mild esophageal dysmotility.  ABDOMEN/PELVIS: No abnormal hypermetabolic activity within the liver, pancreas, adrenal glands, or spleen. No hypermetabolic lymph nodes in the abdomen or pelvis.  Incidental CT findings: Liver without focal lesion. No pericholecystic stranding. Normal pancreas and spleen.  Adrenal glands with normal morphology.  No hydronephrosis.  No acute gastrointestinal process.  Normal appendix.  SKELETON: No focal hypermetabolic activity to suggest skeletal metastasis.  Incidental CT findings: none  IMPRESSION: 1. Mild increased FDG uptake, less than mediastinal blood pool within a sub solid lesion in the superior segment of the RIGHT lower lobe. This remains suspicious for bronchogenic neoplasm based on morphologic characteristics, perhaps indolent based on low level uptake. 2. No FDG avid nodal disease or signs of metastatic disease.  Aortic Atherosclerosis (ICD10-I70.0).   Electronically Signed   By: Zetta Bills M.D.   On: 10/08/2019 11:45 I personally reviewed the CT and PET/CT images and concur with the findings noted above.  Pulmonary function testing 09/13/2019 FVC 2.89 (90%) FEV1 1.76 (73%) FEV1 1.95 (81%) postbronchodilator DLCO 18.24 (88%)  Impression: Teresa Booker is a 74 year old woman with a remote history of tobacco use, fibromyalgia, reflux, hyperlipidemia, irritable bowel syndrome, remote alcohol abuse, and anxiety.  She had a CT done in February 2021 for coronary calcium score.  Her calcium score was 71st percentile with calcium noted in the mid LAD.  She was noted to have a groundglass opacity in the superior segment of the right lower lobe.  That was followed up with a CT of the chest in May.  That showed a 1.8 x 2.3 cm mixed density nodule (mostly subsolid).  Subsequent follow-up and early August showed no significant change from May.  She did however have a PET/CT which showed mild metabolic activity with an SUV of 2.1.   There was no evidence of regional or distant disease.  The differential diagnosis includes low-grade adenocarcinoma, infection and inflammatory processes.  Of these I think the most likely is that it is a low-grade primary bronchogenic carcinoma.  We discussed potential approaches to diagnosis and treatment.  1 option would be to continue with radiographic follow-up.  There has not been any dramatic change.  The downside of that is that at some point this could become an invasive lesion.  There is already some solid component to this soft tissue windows.  Therefore I would favor being a little more aggressive in our approach to the lesion.  Second option would be to do a navigational bronchoscopy or CT-guided needle biopsy.  Unfortunately both of those have a significant incidence of false negative results.  If positive we would recommend surgery and if negative we would be back to radiographic follow-up versus surgery.  The final option would be to proceed with surgical resection for definitive diagnosis and treatment at the same setting.  We discussed the relative advantages and disadvantages of each of those approaches.  I described the proposed surgical procedure of robotic right VATS for superior segmentectomy with Ms. Tien and her significant other, Teresa Booker, who listened to him by phone.  I informed her of the general nature of the procedure including the need for general anesthesia, the incisions to be used, the amount of tissue to be resected, the use of a drainage tube postoperatively, the expected hospital stay, and the overall recovery.  I informed him of the indications, risks, benefits, and alternatives.  They understand the risks include, but not limited to death, MI, DVT, PE, bleeding, possible need for transfusion, infection, prolonged air leak, cardiac arrhythmias, as well as the possibility of other unforeseeable complications.  She had already decided prior to seeing me that she  would like to have this resected to have a definitive answer and not have to be followed indefinitely with CT scans.  She and her partner will work out a date that works for them and then call us to schedule the surgery.  She had only mild CAD on her coronary CT.  She does not have any anginal symptoms.  Her exercise tolerance is good.  I do not see a need for any additional cardiac work-up prior to surgery.  Plan: Patient will call to schedule robotic right VATS for right lower lobe superior segmentectomy.  [name], MD [facility] [phone]

## 2019-10-11 NOTE — H&P (View-Only) (Signed)
PCP is [name], MD Referring Provider is Parrett, Fonnie Mu, NP  Chief Complaint  Patient presents with   Lung Lesion    Surgical consult, PET Scan 10/08/19, Chest CT-super D 09/15/19, PFT's 09/13/19    HPI: Teresa Booker presents for consultation regarding a right lower lobe lung nodule  Teresa Booker is a 74 year old former smoker with a history of fibromyalgia, reflux, hyperlipidemia, irritable bowel syndrome, remote alcohol abuse, and anxiety.  She had a CT done for coronary calcium score back in February.  She was noted to have a 13 x 19 mm groundglass nodularity in the superior segment of the right lower lobe.  A follow-up CT of the chest to more accurately measured the nodule 2.3 x 1.8 cm.  The nodule had mixed density but was primarily subsolid.  A repeat CT was done in August which showed the nodule had not changed significantly.  A PET/CT showed mild metabolic activity with an SUV of 2.1.  She smoked about a pack of cigarettes daily starting at age 39.  She quit 20 years ago.  Overall about a 30-pack-year history.  She is not having any cough, hemoptysis, wheezing, chest pain, tightness, pressure, or shortness of breath with exertion.  Her appetite is good.  She has lost about 5 pounds over the past 3 months with dietary changes.  She is retired.  She lives with her domestic partner.  She has been very anxious about this nodule and each scan as it is done.  Zubrod Score: At the time of surgery this patients most appropriate activity status/level should be described as: [x]     0    Normal activity, no symptoms []     1    Restricted in physical strenuous activity but ambulatory, able to do out light work []     2    Ambulatory and capable of self care, unable to do work activities, up and about >50 % of waking hours                              []     3    Only limited self care, in bed greater than 50% of waking hours []     4    Completely disabled, no self care, confined to bed or chair []      5    Moribund  Past Medical History:  Diagnosis Date   Alcohol abuse    - abstemious 20 years. Attends AA - strong recovery    Allergic reaction    Allergy    seasonal   Anxiety    Fibromyalgia    GERD (gastroesophageal reflux disease)    Hyperlipemia    IBS (irritable bowel syndrome)    Postnasal drip    RBBB (right bundle branch block)    Rotator cuff syndrome    right    Past Surgical History:  Procedure Laterality Date   COLONOSCOPY     MOUTH SURGERY  2002   ROTATOR CUFF REPAIR  10-24-05   had bone spur and laceration off cuff. Daldorf    Family History  Problem Relation Age of Onset   Hyperlipidemia Mother    Irritable bowel syndrome Mother    Coronary artery disease Mother    Heart attack Mother    Aneurysm Mother        brain   Diabetes Brother    Cancer Brother        small  cell lung ca with mets/ smoker   Stroke Other    Aneurysm Other    Lung cancer Other    Colon cancer Neg Hx     Social History Social History   Tobacco Use   Smoking status: Former Smoker    Quit date: 03/12/1996    Years since quitting: 23.5   Smokeless tobacco: Never Used   Tobacco comment: quit appox 10 yrs ago  Vaping Use   Vaping Use: Never used  Substance Use Topics   Alcohol use: No    Alcohol/week: 0.0 standard drinks    Comment: former usuer AA 20+yrs ago strong recovery   Drug use: No    Current Outpatient Medications  Medication Sig Dispense Refill   aspirin 81 MG tablet Take 81 mg by mouth daily.     Calcium 200 MG TABS Take 1 tablet by mouth daily.      Echinacea 125 MG CAPS Take 1 capsule by mouth daily.      hydrOXYzine (ATARAX/VISTARIL) 25 MG tablet Take 1 tablet (25 mg total) by mouth every 8 (eight) hours as needed. 90 tablet 1   hyoscyamine (LEVBID) 0.375 MG 12 hr tablet Take 1 tablet by mouth twice daily 180 tablet 0   L-Lysine 500 MG TABS Take 500 mg by mouth daily.     loratadine (CLARITIN) 10 MG tablet Take  1 tablet (10 mg total) by mouth daily. 90 tablet 3   Magnesium 250 MG TABS Take 250 mg by mouth daily.     metoprolol succinate (TOPROL-XL) 50 MG 24 hr tablet TAKE 1 TABLET BY MOUTH ONCE DAILY WITH A MEAL OR  IMMEDIATELY  FOLLOWING  A  MEAL 90 tablet 3   Multiple Vitamin (MULTIVITAMIN) capsule Take 1 capsule by mouth daily.       omeprazole (PRILOSEC) 20 MG capsule Take 20 mg by mouth daily.       rosuvastatin (CRESTOR) 10 MG tablet Take 1 tablet (10 mg total) by mouth at bedtime. 90 tablet 3   No current facility-administered medications for this visit.    Allergies  Allergen Reactions   Codeine     Makes heart race   Penicillins     unknown   Sulfonamide Derivatives     Has a rash with the use of silvadiene for burn   Aspirin     Upset stomach, High dose only    Review of Systems  Constitutional: Negative for activity change, appetite change and unexpected weight change (Lost 5 pounds in 3 months with diet).  HENT: Negative for trouble swallowing and voice change.   Eyes: Negative for visual disturbance.  Respiratory: Negative for cough, chest tightness, shortness of breath and wheezing.   Cardiovascular: Positive for palpitations. Negative for chest pain and leg swelling.  Gastrointestinal: Positive for abdominal pain (Right upper quadrant pain.). Negative for abdominal distention.  Genitourinary: Negative for difficulty urinating and dysuria.  Musculoskeletal: Negative for arthralgias and myalgias.  Neurological: Negative for seizures, syncope and weakness.  Hematological: Negative for adenopathy. Does not bruise/bleed easily.  Psychiatric/Behavioral: The patient is nervous/anxious.     BP (!) 150/77    Pulse 82    Temp 97.9 F (36.6 C) (Skin)    Resp 20    Ht 5\' 6"  (1.676 m)    Wt 141 lb (64 kg)    SpO2 96% Comment: RA   BMI 22.76 kg/m  Physical Exam Vitals reviewed.  Constitutional:      General: She is not in  acute distress.    Appearance: Normal appearance.   HENT:     Head: Normocephalic and atraumatic.  Eyes:     General: No scleral icterus.    Extraocular Movements: Extraocular movements intact.  Cardiovascular:     Rate and Rhythm: Normal rate and regular rhythm.     Pulses: Normal pulses.     Heart sounds: Normal heart sounds. No murmur heard.  No friction rub. No gallop.   Pulmonary:     Effort: Pulmonary effort is normal. No respiratory distress.     Breath sounds: Normal breath sounds. No wheezing or rales.  Abdominal:     General: There is no distension.     Palpations: Abdomen is soft.     Tenderness: There is no abdominal tenderness.  Musculoskeletal:        General: No swelling.  Lymphadenopathy:     Cervical: No cervical adenopathy.  Skin:    General: Skin is warm and dry.  Neurological:     General: No focal deficit present.     Mental Status: She is alert and oriented to person, place, and time.     Cranial Nerves: No cranial nerve deficit.     Motor: No weakness.    Diagnostic Tests: CT CHEST WITHOUT CONTRAST  TECHNIQUE: Multidetector CT imaging of the chest was performed using thin slice collimation for electromagnetic bronchoscopy planning purposes, without intravenous contrast.  COMPARISON:  Chest CT 07/07/2019 and cardiac CT 04/05/2019.  FINDINGS: Cardiovascular: Atherosclerosis of the aorta, great vessels and coronary arteries. No acute vascular findings on noncontrast imaging. The heart size is normal. There is no pericardial effusion.  Mediastinum/Nodes: There are no enlarged mediastinal, hilar or axillary lymph nodes.Hilar assessment is limited by the lack of intravenous contrast, although the hilar contours appear unchanged. The thyroid gland, trachea and esophagus demonstrate no significant findings.  Lungs/Pleura: No pleural effusion or pneumothorax. Stable mild centrilobular emphysema and biapical scarring. The part solid nodule in the superior segment of the right lower lobe  measures approximately 2.2 x 1.5 cm on image 60/3, not significantly changed from comparison study. There are no increasingly solid components. Other scattered tiny ground-glass nodules in both lungs are stable. No new or enlarging nodules.  Upper abdomen: The visualized upper abdomen appears stable without significant findings.  Musculoskeletal/Chest wall: There is no chest wall mass or suspicious osseous finding.  IMPRESSION: 1. Imaging for bronchoscopy planning. The part solid nodule in the superior segment of the right lower lobe is unchanged from comparison study. No new or enlarging nodules. 2. No adenopathy or pleural effusion. 3. Aortic Atherosclerosis (ICD10-I70.0) and Emphysema (ICD10-J43.9).   Electronically Signed   By: Richardean Sale M.D.   On: 09/15/2019 14:56 NUCLEAR MEDICINE PET SKULL BASE TO THIGH  TECHNIQUE: 6.95 mCi F-18 FDG was injected intravenously. Full-ring PET imaging was performed from the skull base to thigh after the radiotracer. CT data was obtained and used for attenuation correction and anatomic localization.  Fasting blood glucose: 102 mg/dl  COMPARISON:  Chest CTs from August 4th and Jul 07, 2019  FINDINGS: Mediastinal blood pool activity: SUV max 2.9  Liver activity: SUV max NA  NECK: No hypermetabolic lymph nodes in the neck.  Incidental CT findings: none  CHEST: Area of concern in the RIGHT posterior chest is not significantly changed compared to the recent CT evaluation of September 15, 2019 displaying part solid features as before. (Image 64, series 4) (SUVmax = 2.1) no additional suspicious nodules. Airways are patent. No mediastinal  lymphadenopathy. No additional areas of suspicion in the chest or hypermetabolic lesions.  Incidental CT findings: Calcified atheromatous plaque of the thoracic aorta. Calcified coronary artery disease. Normal heart size. No pericardial effusion. Patulous esophagus may indicate  mild esophageal dysmotility.  ABDOMEN/PELVIS: No abnormal hypermetabolic activity within the liver, pancreas, adrenal glands, or spleen. No hypermetabolic lymph nodes in the abdomen or pelvis.  Incidental CT findings: Liver without focal lesion. No pericholecystic stranding. Normal pancreas and spleen.  Adrenal glands with normal morphology.  No hydronephrosis.  No acute gastrointestinal process.  Normal appendix.  SKELETON: No focal hypermetabolic activity to suggest skeletal metastasis.  Incidental CT findings: none  IMPRESSION: 1. Mild increased FDG uptake, less than mediastinal blood pool within a sub solid lesion in the superior segment of the RIGHT lower lobe. This remains suspicious for bronchogenic neoplasm based on morphologic characteristics, perhaps indolent based on low level uptake. 2. No FDG avid nodal disease or signs of metastatic disease.  Aortic Atherosclerosis (ICD10-I70.0).   Electronically Signed   By: Zetta Bills M.D.   On: 10/08/2019 11:45 I personally reviewed the CT and PET/CT images and concur with the findings noted above.  Pulmonary function testing 09/13/2019 FVC 2.89 (90%) FEV1 1.76 (73%) FEV1 1.95 (81%) postbronchodilator DLCO 18.24 (88%)  Impression: Teresa Booker is a 74 year old woman with a remote history of tobacco use, fibromyalgia, reflux, hyperlipidemia, irritable bowel syndrome, remote alcohol abuse, and anxiety.  She had a CT done in February 2021 for coronary calcium score.  Her calcium score was 71st percentile with calcium noted in the mid LAD.  She was noted to have a groundglass opacity in the superior segment of the right lower lobe.  That was followed up with a CT of the chest in May.  That showed a 1.8 x 2.3 cm mixed density nodule (mostly subsolid).  Subsequent follow-up and early August showed no significant change from May.  She did however have a PET/CT which showed mild metabolic activity with an SUV of 2.1.   There was no evidence of regional or distant disease.  The differential diagnosis includes low-grade adenocarcinoma, infection and inflammatory processes.  Of these I think the most likely is that it is a low-grade primary bronchogenic carcinoma.  We discussed potential approaches to diagnosis and treatment.  1 option would be to continue with radiographic follow-up.  There has not been any dramatic change.  The downside of that is that at some point this could become an invasive lesion.  There is already some solid component to this soft tissue windows.  Therefore I would favor being a little more aggressive in our approach to the lesion.  Second option would be to do a navigational bronchoscopy or CT-guided needle biopsy.  Unfortunately both of those have a significant incidence of false negative results.  If positive we would recommend surgery and if negative we would be back to radiographic follow-up versus surgery.  The final option would be to proceed with surgical resection for definitive diagnosis and treatment at the same setting.  We discussed the relative advantages and disadvantages of each of those approaches.  I described the proposed surgical procedure of robotic right VATS for superior segmentectomy with Ms. Simkin and her significant other, [name], who listened to him by phone.  I informed her of the general nature of the procedure including the need for general anesthesia, the incisions to be used, the amount of tissue to be resected, the use of a drainage tube postoperatively, the expected  hospital stay, and the overall recovery.  I informed him of the indications, risks, benefits, and alternatives.  They understand the risks include, but not limited to death, MI, DVT, PE, bleeding, possible need for transfusion, infection, prolonged air leak, cardiac arrhythmias, as well as the possibility of other unforeseeable complications.  She had already decided prior to seeing me that she  would like to have this resected to have a definitive answer and not have to be followed indefinitely with CT scans.  She and her partner will work out a date that works for them and then call us to schedule the surgery.  She had only mild CAD on her coronary CT.  She does not have any anginal symptoms.  Her exercise tolerance is good.  I do not see a need for any additional cardiac work-up prior to surgery.  Plan: Patient will call to schedule robotic right VATS for right lower lobe superior segmentectomy.  [name], MD [facility] [phone]

## 2019-10-13 ENCOUNTER — Other Ambulatory Visit: Payer: Self-pay | Admitting: *Deleted

## 2019-10-13 ENCOUNTER — Ambulatory Visit: Payer: Medicare HMO | Admitting: Pulmonary Disease

## 2019-10-13 DIAGNOSIS — R911 Solitary pulmonary nodule: Secondary | ICD-10-CM

## 2019-10-14 NOTE — Progress Notes (Signed)
[place], [place] - [address] [place] [place] [address] Phone: [phone] Fax: [place] [address] [place] [address] Phone: [phone] Fax: [phone]      Your procedure is scheduled on 10/25/19.  Report to [facility] Main Entrance "A" at 5:30 A.M., and check in at the Admitting office.  Call this number if you have problems the morning of surgery:  [phone]  Call [phone] if you have any questions prior to your surgery date Monday-Friday 8am-4pm    Remember:  Do not eat OR drink after midnight the night before your surgery    Take these medicines the morning of surgery with A SIP OF WATER: hyoscyamine (LEVBID) loratadine (CLARITIN) metoprolol succinate (TOPROL-XL) omeprazole (PRILOSEC)  As of today, STOP taking any Aspirin (unless otherwise instructed by your surgeon) Aleve, Naproxen, Ibuprofen, Motrin, Advil, Goody's, BC's, all herbal medications, fish oil, and all vitamins.                      Do not wear jewelry, make up, or nail polish            Do not wear lotions, powders, perfumes or deodorant.            Do not shave 48 hours prior to surgery.              Do not bring valuables to the hospital.            [facility] is not responsible for any belongings or valuables.  Do NOT Smoke (Tobacco/Vaping) or drink Alcohol 24 hours prior to your procedure If you use a CPAP at night, you may bring all equipment for your overnight stay.   Contacts, glasses, dentures or bridgework may not be worn into surgery.      For patients admitted to the hospital, discharge time will be determined by your treatment team.   Patients discharged the day of surgery will not be allowed to drive home, and someone needs to stay with them for 24 hours.    Special instructions:   [place]- Preparing For  Surgery  Before surgery, you can play an important role. Because skin is not sterile, your skin needs to be as free of germs as possible. You can reduce the number of germs on your skin by washing with CHG (chlorahexidine gluconate) Soap before surgery.  CHG is an antiseptic cleaner which kills germs and bonds with the skin to continue killing germs even after washing.    Oral Hygiene is also important to reduce your risk of infection.  Remember - BRUSH YOUR TEETH THE MORNING OF SURGERY WITH YOUR REGULAR TOOTHPASTE  Please do not use if you have an allergy to CHG or antibacterial soaps. If your skin becomes reddened/irritated stop using the CHG.  Do not shave (including legs and underarms) for at least 48 hours prior to first CHG shower. It is OK to shave your face.  Please follow these instructions carefully.   1. Shower the NIGHT BEFORE SURGERY and the MORNING OF SURGERY with CHG Soap.   2. If you chose to wash your hair, wash your hair first as usual with your normal shampoo.  3. After you shampoo, rinse your hair and body thoroughly to remove the shampoo.  4. Use CHG as you would any other liquid soap. You can apply CHG directly to  the skin and wash gently with a scrungie or a clean washcloth.   5. Apply the CHG Soap to your body ONLY FROM THE NECK DOWN.  Do not use on open wounds or open sores. Avoid contact with your eyes, ears, mouth and genitals (private parts). Wash Face and genitals (private parts)  with your normal soap.   6. Wash thoroughly, paying special attention to the area where your surgery will be performed.  7. Thoroughly rinse your body with warm water from the neck down.  8. DO NOT shower/wash with your normal soap after using and rinsing off the CHG Soap.  9. Pat yourself dry with a CLEAN TOWEL.  10. Wear CLEAN PAJAMAS to bed the night before surgery  11. Place CLEAN SHEETS on your bed the night of your first shower and DO NOT SLEEP WITH PETS.   Day of  Surgery: Wear Clean/Comfortable clothing the morning of surgery Do not apply any deodorants/lotions.   Remember to brush your teeth WITH YOUR REGULAR TOOTHPASTE.   Please read over the following fact sheets that you were given.

## 2019-10-15 ENCOUNTER — Encounter (HOSPITAL_COMMUNITY)
Admission: RE | Admit: 2019-10-15 | Discharge: 2019-10-15 | Disposition: A | Discharge status: Home / Self Care | Payer: Medicare HMO | Source: Ambulatory Visit | Attending: Thoracic Surgery (Cardiothoracic Vascular Surgery) | Admitting: Thoracic Surgery (Cardiothoracic Vascular Surgery)

## 2019-10-15 ENCOUNTER — Other Ambulatory Visit: Payer: Self-pay

## 2019-10-15 ENCOUNTER — Encounter (HOSPITAL_COMMUNITY): Payer: Self-pay

## 2019-10-15 DIAGNOSIS — F419 Anxiety disorder, unspecified: Secondary | ICD-10-CM | POA: Insufficient documentation

## 2019-10-15 DIAGNOSIS — K219 Gastro-esophageal reflux disease without esophagitis: Secondary | ICD-10-CM | POA: Diagnosis not present

## 2019-10-15 DIAGNOSIS — E785 Hyperlipidemia, unspecified: Secondary | ICD-10-CM | POA: Diagnosis not present

## 2019-10-15 DIAGNOSIS — J449 Chronic obstructive pulmonary disease, unspecified: Secondary | ICD-10-CM | POA: Insufficient documentation

## 2019-10-15 DIAGNOSIS — I4510 Unspecified right bundle-branch block: Secondary | ICD-10-CM | POA: Insufficient documentation

## 2019-10-15 DIAGNOSIS — M797 Fibromyalgia: Secondary | ICD-10-CM | POA: Diagnosis not present

## 2019-10-15 DIAGNOSIS — Z7982 Long term (current) use of aspirin: Secondary | ICD-10-CM | POA: Insufficient documentation

## 2019-10-15 DIAGNOSIS — I10 Essential (primary) hypertension: Secondary | ICD-10-CM | POA: Insufficient documentation

## 2019-10-15 DIAGNOSIS — I700 Atherosclerosis of aorta: Secondary | ICD-10-CM | POA: Insufficient documentation

## 2019-10-15 DIAGNOSIS — K589 Irritable bowel syndrome without diarrhea: Secondary | ICD-10-CM | POA: Diagnosis not present

## 2019-10-15 DIAGNOSIS — R911 Solitary pulmonary nodule: Secondary | ICD-10-CM | POA: Insufficient documentation

## 2019-10-15 DIAGNOSIS — Z79899 Other long term (current) drug therapy: Secondary | ICD-10-CM | POA: Insufficient documentation

## 2019-10-15 DIAGNOSIS — Z01818 Encounter for other preprocedural examination: Secondary | ICD-10-CM | POA: Diagnosis not present

## 2019-10-15 HISTORY — DX: Essential (primary) hypertension: I10

## 2019-10-15 HISTORY — DX: Chronic obstructive pulmonary disease, unspecified: J44.9

## 2019-10-15 HISTORY — DX: Family history of other specified conditions: Z84.89

## 2019-10-15 LAB — URINALYSIS, ROUTINE W REFLEX MICROSCOPIC
Bilirubin Urine: NEGATIVE
Glucose, UA: NEGATIVE mg/dL
Hgb urine dipstick: NEGATIVE
Ketones, ur: NEGATIVE mg/dL
Leukocytes,Ua: NEGATIVE
Nitrite: NEGATIVE
Protein, ur: NEGATIVE mg/dL
Specific Gravity, Urine: 1.011 (ref 1.005–1.030)
pH: 5 (ref 5.0–8.0)

## 2019-10-15 LAB — TYPE AND SCREEN
ABO/RH(D): A POS
Antibody Screen: NEGATIVE

## 2019-10-15 LAB — COMPREHENSIVE METABOLIC PANEL
ALT: 25 U/L (ref 0–44)
AST: 26 U/L (ref 15–41)
Albumin: 4.1 g/dL (ref 3.5–5.0)
Alkaline Phosphatase: 53 U/L (ref 38–126)
Anion gap: 11 (ref 5–15)
BUN: 15 mg/dL (ref 8–23)
CO2: 22 mmol/L (ref 22–32)
Calcium: 9.4 mg/dL (ref 8.9–10.3)
Chloride: 102 mmol/L (ref 98–111)
Creatinine, Ser: 0.81 mg/dL (ref 0.44–1.00)
GFR calc Af Amer: >60 mL/min (ref >60)
GFR calc non Af Amer: >60 mL/min (ref >60)
Glucose, Bld: 106 mg/dL — ABNORMAL HIGH (ref 70–99)
Potassium: 4.2 mmol/L (ref 3.5–5.1)
Sodium: 135 mmol/L (ref 135–145)
Total Bilirubin: 0.5 mg/dL (ref 0.3–1.2)
Total Protein: 6.9 g/dL (ref 6.5–8.1)

## 2019-10-15 LAB — BLOOD GAS, ARTERIAL
Acid-Base Excess: 1.2 mmol/L (ref 0.0–2.0)
Bicarbonate: 25.3 mmol/L (ref 20.0–28.0)
FIO2: 21
O2 Saturation: 98.3 %
Patient temperature: 37
pCO2 arterial: 40.7 mmHg (ref 32.0–48.0)
pH, Arterial: 7.41 (ref 7.350–7.450)
pO2, Arterial: 112 mmHg — ABNORMAL HIGH (ref 83.0–108.0)

## 2019-10-15 LAB — PROTIME-INR
INR: 0.9 (ref 0.8–1.2)
Prothrombin Time: 11.6 seconds (ref 11.4–15.2)

## 2019-10-15 LAB — SURGICAL PCR SCREEN
MRSA, PCR: NEGATIVE
Staphylococcus aureus: NEGATIVE

## 2019-10-15 LAB — CBC
HCT: 39.3 % (ref 36.0–46.0)
Hemoglobin: 12.3 g/dL (ref 12.0–15.0)
MCH: 29.9 pg (ref 26.0–34.0)
MCHC: 31.3 g/dL (ref 30.0–36.0)
MCV: 95.4 fL (ref 80.0–100.0)
Platelets: 252 10*3/uL (ref 150–400)
RBC: 4.12 MIL/uL (ref 3.87–5.11)
RDW: 12.9 % (ref 11.5–15.5)
WBC: 7.2 10*3/uL (ref 4.0–10.5)
nRBC: 0 % (ref 0.0–0.2)

## 2019-10-15 LAB — APTT: aPTT: 25 seconds (ref 24–36)

## 2019-10-15 NOTE — Progress Notes (Signed)
PCP - bruce burchette Cardiologist - peter nishan    Chest x-ray - DOS EKG - 10/15/19 Stress Test - 3/19 ECHO - 3/19 Cardiac Cath - na  Sleep Study - na CPAP - na  Fasting Blood Sugar - na Checks Blood Sugar _____ times a day  Blood Thinner Instructions: na Aspirin Instructions: pt. To call Dr. Leonarda Salon office for clarification    COVID TEST- DOS, pt. Going out of town prior to surgery   Anesthesia review: cardiac hx.  Patient denies shortness of breath, fever, cough and chest pain at PAT appointment   All instructions explained to the patient, with a verbal understanding of the material. Patient agrees to go over the instructions while at home for a better understanding. Patient also instructed to self quarantine after being tested for COVID-19. The opportunity to ask questions was provided.

## 2019-10-19 NOTE — Anesthesia Preprocedure Evaluation (Addendum)
Anesthesia Evaluation  Patient identified by MRN, date of birth, ID band Patient awake    Reviewed: Allergy & Precautions, NPO status , Patient's Chart, lab work & pertinent test results, reviewed documented beta blocker date and time   History of Anesthesia Complications Negative for: history of anesthetic complications  Airway Mallampati: II  TM Distance: >3 FB Neck ROM: Full    Dental  (+) Dental Advisory Given   Pulmonary COPD, former smoker,  Lung nodule 10/25/2019 SARS coronavirus NEG   breath sounds clear to auscultation       Cardiovascular hypertension, Pt. on medications and Pt. on home beta blockers (-) angina+ CAD (non-obstructive on CT)   Rhythm:Regular Rate:Normal  '19 Stress: EF 70%, no ischemia '19 ECHO: EF 60-65%, normal wall motion, mild MVP with out MR   Neuro/Psych negative neurological ROS     GI/Hepatic GERD  Medicated and Controlled,(+)     substance abuse (s/p ETOH abuse)  ,   Endo/Other  negative endocrine ROS  Renal/GU negative Renal ROS     Musculoskeletal  (+) Fibromyalgia -  Abdominal   Peds  Hematology negative hematology ROS (+)   Anesthesia Other Findings   Reproductive/Obstetrics                           Anesthesia Physical Anesthesia Plan  ASA: III  Anesthesia Plan: General   Post-op Pain Management:    Induction: Intravenous  PONV Risk Score and Plan: 3 and Ondansetron, Dexamethasone and Droperidol  Airway Management Planned: Double Lumen EBT and Oral ETT  Additional Equipment: Arterial line  Intra-op Plan:   Post-operative Plan: Extubation in OR  Informed Consent: I have reviewed the patients History and Physical, chart, labs and discussed the procedure including the risks, benefits and alternatives for the proposed anesthesia with the patient or authorized representative who has indicated his/her understanding and acceptance.      Dental advisory given  Plan Discussed with: CRNA and Surgeon  Anesthesia Plan Comments: (PAT note written 10/19/2019 by Myra Gianotti, PA-C. )      Anesthesia Quick Evaluation

## 2019-10-19 NOTE — Progress Notes (Signed)
Anesthesia Chart Review:  Case: [id number] Date/Time: 10/25/19 0715   Procedure: XI ROBOTIC ASSISTED THORACOSCOPY-SUPERIOR SEGMENTECTOMY (Right Chest)   Anesthesia type: General   Pre-op diagnosis: RLL LUNG NODULE   Location: [facility] OR ROOM 10 / [place] [facility]   Surgeons: [name], MD      DISCUSSION: Patient is a 74 year old female scheduled for the above procedure. Patient had an incidental finding of RLL lung nodule on 04/05/19 CT cardiac scoring, unchanged on 07/07/19 following imaging with multidisciplinary thoracic oncology consultation advised. She was subsequently evaluated by pulmonology and CT surgery.   Other history includes former smoker (25 pack years, quit 03/12/96), COPD, HTN, HLD, RBBB (history of RSR prime/QR pattern in V1), IBS, GERD, anxiety, fibromyalgia, former alcohol abuse (sober, AA 20+ years).   Dr. Roxan Hockey classified her Zubrod score as 0 (normal activity, no symptoms). He notes that she had only mild CAD on 03/2019 coronary CT, has no anginal symptoms, and has good exercise tolerance, so he did not feel she would require additional cardiac work-up prior to surgery. She also had a non-ischemic stress test in 04/2017. Last visit with cardiologist Dr. Johnsie Cancel 07/19/19 with asap referral to pulmonology for RLL nodule seen on CT.   [place] COVID-19 vaccine 04/12/19. She is for COVID-19 test on the day of surgery as she will be out of town prior to surgery and unable to quarantine. Anesthesia team to evaluate on the day of surgery.    VS: BP (!) 155/80   Pulse 71   Temp 36.7 C (Oral)   Resp 18   Ht 5\' 6"  (1.676 m)   Wt 62.7 kg   SpO2 100%   BMI 22.31 kg/m    PROVIDERS: [name], MD is PCP  Jenkins Rouge, MD is cardiologist. Last visit 07/19/19. On Toprol for palpitation history (PVCs, PAcs on Holter monitor 2019). Ina Homes, MD is pulmonologist   LABS: Labs reviewed: Acceptable for surgery. (all labs ordered are listed, but only abnormal results are  displayed)  Labs Reviewed  BLOOD GAS, ARTERIAL - Abnormal; Notable for the following components:      Result Value   pO2, Arterial 112 (*)    Allens test (pass/fail) BRACHIAL ARTERY (*)    All other components within normal limits  COMPREHENSIVE METABOLIC PANEL - Abnormal; Notable for the following components:   Glucose, Bld 106 (*)    All other components within normal limits  SURGICAL PCR SCREEN  APTT  CBC  PROTIME-INR  URINALYSIS, ROUTINE W REFLEX MICROSCOPIC  TYPE AND SCREEN    Pulmonary function testing 09/13/2019 FVC 2.89 (90%) FEV1 1.76 (73%) FEV1 1.95 (81%) postbronchodilator DLCO 18.24 (88%)   IMAGES: For CXR on the day of surgery.   PET Scan 10/08/19: IMPRESSION: 1. Mild increased FDG uptake, less than mediastinal blood pool within a sub solid lesion in the superior segment of the RIGHT lower lobe. This remains suspicious for bronchogenic neoplasm based on morphologic characteristics, perhaps indolent based on low level uptake. 2. No FDG avid nodal disease or signs of metastatic disease. Aortic Atherosclerosis (ICD10-I70.0).   EKG: 10/15/19: Normal sinus rhythm Possible Left atrial enlargement Borderline ECG No previous tracing Confirmed by Camnitz, Will [id number]) on 10/15/2019 11:04:07 AM   CV: CT cardiac Calcium scoring 04/05/19: FINDINGS: Non-cardiac: See separate report from [facility]. Ascending aorta: Normal diameter 3.0 cm Pericardium: Normal Coronary arteries: Calcium noted in proximal LAD IMPRESSION: Coronary calcium score of 109 . This was 67 st percentile for age and  sex matched control.   Nuclear stress tet 05/06/17:  Nuclear stress EF: 70%.  There was no ST segment deviation noted during stress.  Blood pressure demonstrated a hypertensive response to exercise.  The study is normal.  This is a low risk study.  The left ventricular ejection fraction is hyperdynamic (>65%). Normal exercise nuclear stress test with no  evidence for prior infarct or ischemia.  Normal LVEF. Poor exercise capacity. Hypertensive response to exercise.   Echo 05/06/17: Study Conclusions  - Left ventricle: The cavity size was normal. Systolic function was  normal. The estimated ejection fraction was in the range of 60%  to 65%. Wall motion was normal; there were no regional wall  motion abnormalities. Left ventricular diastolic function  parameters were normal.  - Mitral valve: Mild, late systolicprolapse, involving the anterior  leaflet. There was no significant regurgitation.  - Pulmonary arteries: PA peak pressure: 32 mm Hg (S).    48 Hour Holter monitor 04/18/17: Study Highlights Sinus rhythm PAC;s/ PVC;s No significant arrhythmias   Past Medical History:  Diagnosis Date  . Alcohol abuse    - abstemious 20 years. Attends AA - strong recovery   . Allergic reaction   . Allergy    seasonal  . Anxiety   . COPD (chronic obstructive pulmonary disease) (HCC)    minor  . Family history of adverse reaction to anesthesia    mother had problems with n/v  . Fibromyalgia    slight  . GERD (gastroesophageal reflux disease)   . Hyperlipemia   . Hypertension   . IBS (irritable bowel syndrome)   . Postnasal drip   . RBBB (right bundle branch block)   . Rotator cuff syndrome    right    Past Surgical History:  Procedure Laterality Date  . COLONOSCOPY    . MOUTH SURGERY  2002  . ROTATOR CUFF REPAIR  10-24-05   had bone spur and laceration off cuff. [place]    MEDICATIONS: . aspirin EC 81 MG tablet  . Calcium 200 MG TABS  . Echinacea 125 MG CAPS  . hydrOXYzine (ATARAX/VISTARIL) 25 MG tablet  . hyoscyamine (LEVBID) 0.375 MG 12 hr tablet  . L-Lysine 500 MG TABS  . loratadine (CLARITIN) 10 MG tablet  . Magnesium 250 MG TABS  . metoprolol succinate (TOPROL-XL) 50 MG 24 hr tablet  . Multiple Vitamin (MULTIVITAMIN WITH MINERALS) TABS tablet  . omeprazole (PRILOSEC) 20 MG capsule  . rosuvastatin  (CRESTOR) 10 MG tablet   No current facility-administered medications for this encounter.    Myra Gianotti, PA-C Surgical Short Stay/Anesthesiology [facility] Phone [phone] [facility] Phone [phone] 10/19/2019 10:07 AM

## 2019-10-25 ENCOUNTER — Inpatient Hospital Stay (HOSPITAL_COMMUNITY): Payer: Medicare HMO | Admitting: Certified Registered Nurse Anesthetist

## 2019-10-25 ENCOUNTER — Inpatient Hospital Stay (HOSPITAL_COMMUNITY): Payer: Medicare HMO | Admitting: Vascular Surgery

## 2019-10-25 ENCOUNTER — Other Ambulatory Visit: Payer: Self-pay

## 2019-10-25 ENCOUNTER — Inpatient Hospital Stay (HOSPITAL_COMMUNITY)
Admission: RE | Admit: 2019-10-25 | Discharge: 2019-10-28 | DRG: 164 | Disposition: A | Discharge status: Home / Self Care | Payer: Medicare HMO | Attending: Thoracic Surgery (Cardiothoracic Vascular Surgery) | Admitting: Thoracic Surgery (Cardiothoracic Vascular Surgery)

## 2019-10-25 ENCOUNTER — Encounter (HOSPITAL_COMMUNITY)
Admission: RE | Disposition: A | Discharge status: Home / Self Care | Payer: Self-pay | Source: Home / Self Care | Attending: Thoracic Surgery (Cardiothoracic Vascular Surgery)

## 2019-10-25 ENCOUNTER — Inpatient Hospital Stay (HOSPITAL_COMMUNITY): Payer: Medicare HMO

## 2019-10-25 ENCOUNTER — Encounter (HOSPITAL_COMMUNITY): Payer: Self-pay | Admitting: Thoracic Surgery (Cardiothoracic Vascular Surgery)

## 2019-10-25 DIAGNOSIS — Z902 Acquired absence of lung [part of]: Secondary | ICD-10-CM

## 2019-10-25 DIAGNOSIS — Z8249 Family history of ischemic heart disease and other diseases of the circulatory system: Secondary | ICD-10-CM | POA: Diagnosis not present

## 2019-10-25 DIAGNOSIS — Z4682 Encounter for fitting and adjustment of non-vascular catheter: Secondary | ICD-10-CM

## 2019-10-25 DIAGNOSIS — Z01818 Encounter for other preprocedural examination: Secondary | ICD-10-CM | POA: Diagnosis not present

## 2019-10-25 DIAGNOSIS — Z801 Family history of malignant neoplasm of trachea, bronchus and lung: Secondary | ICD-10-CM | POA: Diagnosis not present

## 2019-10-25 DIAGNOSIS — Z885 Allergy status to narcotic agent status: Secondary | ICD-10-CM

## 2019-10-25 DIAGNOSIS — Z886 Allergy status to analgesic agent status: Secondary | ICD-10-CM | POA: Diagnosis not present

## 2019-10-25 DIAGNOSIS — I10 Essential (primary) hypertension: Secondary | ICD-10-CM | POA: Diagnosis present

## 2019-10-25 DIAGNOSIS — I2510 Atherosclerotic heart disease of native coronary artery without angina pectoris: Secondary | ICD-10-CM | POA: Diagnosis not present

## 2019-10-25 DIAGNOSIS — R911 Solitary pulmonary nodule: Secondary | ICD-10-CM | POA: Diagnosis not present

## 2019-10-25 DIAGNOSIS — Z882 Allergy status to sulfonamides status: Secondary | ICD-10-CM

## 2019-10-25 DIAGNOSIS — Z20822 Contact with and (suspected) exposure to covid-19: Secondary | ICD-10-CM | POA: Diagnosis present

## 2019-10-25 DIAGNOSIS — Z79899 Other long term (current) drug therapy: Secondary | ICD-10-CM | POA: Diagnosis not present

## 2019-10-25 DIAGNOSIS — K219 Gastro-esophageal reflux disease without esophagitis: Secondary | ICD-10-CM | POA: Diagnosis not present

## 2019-10-25 DIAGNOSIS — I700 Atherosclerosis of aorta: Secondary | ICD-10-CM | POA: Diagnosis present

## 2019-10-25 DIAGNOSIS — K589 Irritable bowel syndrome without diarrhea: Secondary | ICD-10-CM | POA: Diagnosis present

## 2019-10-25 DIAGNOSIS — E785 Hyperlipidemia, unspecified: Secondary | ICD-10-CM | POA: Diagnosis not present

## 2019-10-25 DIAGNOSIS — J449 Chronic obstructive pulmonary disease, unspecified: Secondary | ICD-10-CM | POA: Diagnosis present

## 2019-10-25 DIAGNOSIS — M797 Fibromyalgia: Secondary | ICD-10-CM | POA: Diagnosis not present

## 2019-10-25 DIAGNOSIS — J9382 Other air leak: Secondary | ICD-10-CM | POA: Diagnosis not present

## 2019-10-25 DIAGNOSIS — C3431 Malignant neoplasm of lower lobe, right bronchus or lung: Principal | ICD-10-CM | POA: Diagnosis present

## 2019-10-25 DIAGNOSIS — C3491 Malignant neoplasm of unspecified part of right bronchus or lung: Secondary | ICD-10-CM | POA: Diagnosis present

## 2019-10-25 DIAGNOSIS — J302 Other seasonal allergic rhinitis: Secondary | ICD-10-CM | POA: Diagnosis present

## 2019-10-25 DIAGNOSIS — Z880 Allergy status to penicillin: Secondary | ICD-10-CM

## 2019-10-25 DIAGNOSIS — Z87891 Personal history of nicotine dependence: Secondary | ICD-10-CM

## 2019-10-25 DIAGNOSIS — J939 Pneumothorax, unspecified: Secondary | ICD-10-CM | POA: Diagnosis not present

## 2019-10-25 DIAGNOSIS — R69 Illness, unspecified: Secondary | ICD-10-CM | POA: Diagnosis not present

## 2019-10-25 DIAGNOSIS — Z7982 Long term (current) use of aspirin: Secondary | ICD-10-CM | POA: Diagnosis not present

## 2019-10-25 DIAGNOSIS — F419 Anxiety disorder, unspecified: Secondary | ICD-10-CM | POA: Diagnosis present

## 2019-10-25 DIAGNOSIS — Z9889 Other specified postprocedural states: Secondary | ICD-10-CM

## 2019-10-25 DIAGNOSIS — F1010 Alcohol abuse, uncomplicated: Secondary | ICD-10-CM | POA: Diagnosis present

## 2019-10-25 DIAGNOSIS — J9811 Atelectasis: Secondary | ICD-10-CM | POA: Diagnosis not present

## 2019-10-25 DIAGNOSIS — R918 Other nonspecific abnormal finding of lung field: Secondary | ICD-10-CM | POA: Diagnosis not present

## 2019-10-25 HISTORY — PX: INTERCOSTAL NERVE BLOCK: SHX5021

## 2019-10-25 HISTORY — PX: XI ROBOTIC ASSISTED THORACOSCOPY- SEGMENTECTOMY: SHX6881

## 2019-10-25 HISTORY — PX: NODE DISSECTION: SHX5269

## 2019-10-25 LAB — ABO/RH: ABO/RH(D): A POS

## 2019-10-25 LAB — GLUCOSE, CAPILLARY
Glucose-Capillary: 173 mg/dL — ABNORMAL HIGH (ref 70–99)
Glucose-Capillary: 180 mg/dL — ABNORMAL HIGH (ref 70–99)

## 2019-10-25 LAB — SARS CORONAVIRUS 2 BY RT PCR (HOSPITAL ORDER, PERFORMED IN ~~LOC~~ HOSPITAL LAB): SARS Coronavirus 2: NEGATIVE

## 2019-10-25 SURGERY — RESECTION, LUNG, SEGMENTAL, ROBOT-ASSISTED
Anesthesia: General | Site: Chest | Laterality: Right

## 2019-10-25 MED ORDER — FENTANYL CITRATE (PF) 250 MCG/5ML IJ SOLN
INTRAMUSCULAR | Status: DC | PRN
Start: 2019-10-25 — End: 2019-10-25
  Administered 2019-10-25 (×3): 50 ug via INTRAVENOUS
  Administered 2019-10-25: 200 ug via INTRAVENOUS

## 2019-10-25 MED ORDER — INDOCYANINE GREEN 25 MG IV SOLR
INTRAVENOUS | Status: DC | PRN
  Administered 2019-10-25: 15 mg via INTRAVENOUS

## 2019-10-25 MED ORDER — PROTAMINE SULFATE 10 MG/ML IV SOLN
INTRAVENOUS | Status: AC
  Filled 2019-10-25: qty 25

## 2019-10-25 MED ORDER — PHENYLEPHRINE 40 MCG/ML (10ML) SYRINGE FOR IV PUSH (FOR BLOOD PRESSURE SUPPORT)
PREFILLED_SYRINGE | INTRAVENOUS | Status: DC | PRN
  Administered 2019-10-25: 40 ug via INTRAVENOUS
  Administered 2019-10-25 (×2): 20 ug via INTRAVENOUS

## 2019-10-25 MED ORDER — ROSUVASTATIN CALCIUM 5 MG PO TABS
10.0000 mg | ORAL_TABLET | Freq: Every day | ORAL | Status: DC
  Administered 2019-10-26 – 2019-10-27 (×2): 10 mg via ORAL
  Filled 2019-10-25 (×2): qty 2

## 2019-10-25 MED ORDER — 0.9 % SODIUM CHLORIDE (POUR BTL) OPTIME
TOPICAL | Status: DC | PRN
  Administered 2019-10-25: 2000 mL

## 2019-10-25 MED ORDER — PHENYLEPHRINE 40 MCG/ML (10ML) SYRINGE FOR IV PUSH (FOR BLOOD PRESSURE SUPPORT)
PREFILLED_SYRINGE | INTRAVENOUS | Status: AC
  Filled 2019-10-25: qty 10

## 2019-10-25 MED ORDER — HYOSCYAMINE SULFATE ER 0.375 MG PO TB12
0.3750 mg | ORAL_TABLET | Freq: Two times a day (BID) | ORAL | Status: DC
  Administered 2019-10-26 – 2019-10-28 (×5): 0.375 mg via ORAL
  Filled 2019-10-25 (×6): qty 1

## 2019-10-25 MED ORDER — HEMOSTATIC AGENTS (NO CHARGE) OPTIME
TOPICAL | Status: DC | PRN
  Administered 2019-10-25 (×2): 1 via TOPICAL

## 2019-10-25 MED ORDER — MEPERIDINE HCL 25 MG/ML IJ SOLN: 6.2500 mg | INTRAMUSCULAR | Status: DC | PRN

## 2019-10-25 MED ORDER — PROMETHAZINE HCL 25 MG/ML IJ SOLN: 6.2500 mg | INTRAMUSCULAR | Status: DC | PRN

## 2019-10-25 MED ORDER — KETOROLAC TROMETHAMINE 15 MG/ML IJ SOLN
INTRAMUSCULAR | Status: AC
  Administered 2019-10-25: 15 mg via INTRAVENOUS
  Filled 2019-10-25: qty 1

## 2019-10-25 MED ORDER — ENOXAPARIN SODIUM 40 MG/0.4ML ~~LOC~~ SOLN
40.0000 mg | Freq: Every day | SUBCUTANEOUS | Status: DC
  Filled 2019-10-25 (×3): qty 0.4

## 2019-10-25 MED ORDER — ACETAMINOPHEN 160 MG/5ML PO SOLN: 1000.0000 mg | Freq: Four times a day (QID) | ORAL | Status: DC

## 2019-10-25 MED ORDER — MIDAZOLAM HCL 2 MG/2ML IJ SOLN
INTRAMUSCULAR | Status: AC
  Filled 2019-10-25: qty 2

## 2019-10-25 MED ORDER — DEXMEDETOMIDINE (PRECEDEX) IN NS 20 MCG/5ML (4 MCG/ML) IV SYRINGE
PREFILLED_SYRINGE | INTRAVENOUS | Status: DC | PRN
  Administered 2019-10-25 (×2): 8 ug via INTRAVENOUS

## 2019-10-25 MED ORDER — LACTATED RINGERS IV SOLN: INTRAVENOUS | Status: DC

## 2019-10-25 MED ORDER — FENTANYL CITRATE (PF) 250 MCG/5ML IJ SOLN
INTRAMUSCULAR | Status: AC
  Filled 2019-10-25: qty 5

## 2019-10-25 MED ORDER — DEXAMETHASONE SODIUM PHOSPHATE 10 MG/ML IJ SOLN
INTRAMUSCULAR | Status: AC
  Filled 2019-10-25: qty 1

## 2019-10-25 MED ORDER — MIDAZOLAM HCL 2 MG/2ML IJ SOLN
INTRAMUSCULAR | Status: DC | PRN
  Administered 2019-10-25 (×2): 1 mg via INTRAVENOUS

## 2019-10-25 MED ORDER — MAGNESIUM OXIDE 400 (241.3 MG) MG PO TABS
400.0000 mg | ORAL_TABLET | Freq: Every evening | ORAL | Status: DC
  Administered 2019-10-26 – 2019-10-27 (×2): 400 mg via ORAL
  Filled 2019-10-25 (×2): qty 1

## 2019-10-25 MED ORDER — ACETAMINOPHEN 500 MG PO TABS
1000.0000 mg | ORAL_TABLET | Freq: Four times a day (QID) | ORAL | Status: DC
  Administered 2019-10-25 – 2019-10-28 (×11): 1000 mg via ORAL
  Filled 2019-10-25 (×12): qty 2

## 2019-10-25 MED ORDER — INSULIN ASPART 100 UNIT/ML ~~LOC~~ SOLN: 0.0000 [IU] | Freq: Three times a day (TID) | SUBCUTANEOUS | Status: DC

## 2019-10-25 MED ORDER — LIDOCAINE 2% (20 MG/ML) 5 ML SYRINGE
INTRAMUSCULAR | Status: AC
  Filled 2019-10-25: qty 5

## 2019-10-25 MED ORDER — ORAL CARE MOUTH RINSE: 15.0000 mL | Freq: Once | OROMUCOSAL | Status: AC

## 2019-10-25 MED ORDER — LABETALOL HCL 5 MG/ML IV SOLN
INTRAVENOUS | Status: DC | PRN
  Administered 2019-10-25 (×4): 5 mg via INTRAVENOUS

## 2019-10-25 MED ORDER — ONDANSETRON HCL 4 MG/2ML IJ SOLN
INTRAMUSCULAR | Status: DC | PRN
  Administered 2019-10-25: 4 mg via INTRAVENOUS

## 2019-10-25 MED ORDER — HYDROMORPHONE HCL 1 MG/ML IJ SOLN: 0.2500 mg | INTRAMUSCULAR | Status: DC | PRN

## 2019-10-25 MED ORDER — SUGAMMADEX SODIUM 200 MG/2ML IV SOLN
INTRAVENOUS | Status: DC | PRN
  Administered 2019-10-25: 200 mg via INTRAVENOUS

## 2019-10-25 MED ORDER — SODIUM CHLORIDE 0.9 % IV SOLN: INTRAVENOUS | Status: DC | PRN

## 2019-10-25 MED ORDER — ONDANSETRON HCL 4 MG/2ML IJ SOLN
4.0000 mg | Freq: Four times a day (QID) | INTRAMUSCULAR | Status: DC | PRN
  Administered 2019-10-28: 4 mg via INTRAVENOUS
  Filled 2019-10-25: qty 2

## 2019-10-25 MED ORDER — SODIUM CHLORIDE 0.9 % IV SOLN: INTRAVENOUS | Status: DC

## 2019-10-25 MED ORDER — FENTANYL CITRATE (PF) 100 MCG/2ML IJ SOLN: 25.0000 ug | INTRAMUSCULAR | Status: DC | PRN

## 2019-10-25 MED ORDER — ROCURONIUM BROMIDE 10 MG/ML (PF) SYRINGE
PREFILLED_SYRINGE | INTRAVENOUS | Status: AC
  Filled 2019-10-25: qty 10

## 2019-10-25 MED ORDER — SENNOSIDES-DOCUSATE SODIUM 8.6-50 MG PO TABS
1.0000 | ORAL_TABLET | Freq: Every day | ORAL | Status: DC
  Administered 2019-10-25 – 2019-10-27 (×3): 1 via ORAL
  Filled 2019-10-25 (×3): qty 1

## 2019-10-25 MED ORDER — SODIUM CHLORIDE 0.9 % IR SOLN
Status: DC | PRN
  Administered 2019-10-25: 1000 mL

## 2019-10-25 MED ORDER — HEPARIN SODIUM (PORCINE) 1000 UNIT/ML IJ SOLN
INTRAMUSCULAR | Status: AC
  Filled 2019-10-25: qty 1

## 2019-10-25 MED ORDER — MIDAZOLAM HCL 2 MG/2ML IJ SOLN: 0.5000 mg | Freq: Once | INTRAMUSCULAR | Status: DC | PRN

## 2019-10-25 MED ORDER — BUPIVACAINE LIPOSOME 1.3 % IJ SUSP
20.0000 mL | Freq: Once | INTRAMUSCULAR | Status: DC
  Filled 2019-10-25: qty 20

## 2019-10-25 MED ORDER — BISACODYL 5 MG PO TBEC
10.0000 mg | DELAYED_RELEASE_TABLET | Freq: Every day | ORAL | Status: DC
  Administered 2019-10-28: 10 mg via ORAL
  Filled 2019-10-25: qty 2

## 2019-10-25 MED ORDER — ACETAMINOPHEN 500 MG PO TABS
1000.0000 mg | ORAL_TABLET | Freq: Once | ORAL | Status: AC
  Administered 2019-10-25: 1000 mg via ORAL
  Filled 2019-10-25: qty 2

## 2019-10-25 MED ORDER — PROPOFOL 10 MG/ML IV BOLUS
INTRAVENOUS | Status: AC
  Filled 2019-10-25: qty 40

## 2019-10-25 MED ORDER — DEXAMETHASONE SODIUM PHOSPHATE 10 MG/ML IJ SOLN
INTRAMUSCULAR | Status: DC | PRN
  Administered 2019-10-25: 10 mg via INTRAVENOUS

## 2019-10-25 MED ORDER — TRAMADOL HCL 50 MG PO TABS
50.0000 mg | ORAL_TABLET | Freq: Four times a day (QID) | ORAL | Status: DC | PRN
  Administered 2019-10-26 (×2): 100 mg via ORAL
  Filled 2019-10-25 (×2): qty 2

## 2019-10-25 MED ORDER — ONDANSETRON HCL 4 MG/2ML IJ SOLN
INTRAMUSCULAR | Status: AC
  Filled 2019-10-25: qty 2

## 2019-10-25 MED ORDER — ROCURONIUM BROMIDE 10 MG/ML (PF) SYRINGE
PREFILLED_SYRINGE | INTRAVENOUS | Status: DC | PRN
  Administered 2019-10-25: 40 mg via INTRAVENOUS
  Administered 2019-10-25: 60 mg via INTRAVENOUS

## 2019-10-25 MED ORDER — CHLORHEXIDINE GLUCONATE 0.12 % MT SOLN
15.0000 mL | Freq: Once | OROMUCOSAL | Status: AC
  Administered 2019-10-25: 15 mL via OROMUCOSAL
  Filled 2019-10-25: qty 15

## 2019-10-25 MED ORDER — VANCOMYCIN HCL IN DEXTROSE 1-5 GM/200ML-% IV SOLN
1000.0000 mg | INTRAVENOUS | Status: AC
  Administered 2019-10-25: 1000 mg via INTRAVENOUS
  Filled 2019-10-25: qty 200

## 2019-10-25 MED ORDER — ROCURONIUM BROMIDE 10 MG/ML (PF) SYRINGE
PREFILLED_SYRINGE | INTRAVENOUS | Status: AC
  Filled 2019-10-25: qty 30

## 2019-10-25 MED ORDER — BUPIVACAINE HCL (PF) 0.5 % IJ SOLN
INTRAMUSCULAR | Status: AC
  Filled 2019-10-25: qty 30

## 2019-10-25 MED ORDER — PHENYLEPHRINE HCL-NACL 10-0.9 MG/250ML-% IV SOLN
INTRAVENOUS | Status: DC | PRN
  Administered 2019-10-25: 25 ug/min via INTRAVENOUS

## 2019-10-25 MED ORDER — LIDOCAINE 2% (20 MG/ML) 5 ML SYRINGE
INTRAMUSCULAR | Status: DC | PRN
  Administered 2019-10-25: 20 mg via INTRAVENOUS

## 2019-10-25 MED ORDER — KETOROLAC TROMETHAMINE 15 MG/ML IJ SOLN
15.0000 mg | Freq: Four times a day (QID) | INTRAMUSCULAR | Status: DC
  Administered 2019-10-25 – 2019-10-28 (×11): 15 mg via INTRAVENOUS
  Filled 2019-10-25 (×11): qty 1

## 2019-10-25 MED ORDER — CHLORHEXIDINE GLUCONATE CLOTH 2 % EX PADS
6.0000 | MEDICATED_PAD | Freq: Every day | CUTANEOUS | Status: DC
  Administered 2019-10-27: 6 via TOPICAL

## 2019-10-25 MED ORDER — VANCOMYCIN HCL IN DEXTROSE 1-5 GM/200ML-% IV SOLN
1000.0000 mg | Freq: Two times a day (BID) | INTRAVENOUS | Status: AC
  Administered 2019-10-25: 1000 mg via INTRAVENOUS
  Filled 2019-10-25 (×2): qty 200

## 2019-10-25 MED ORDER — PROPOFOL 10 MG/ML IV BOLUS
INTRAVENOUS | Status: DC | PRN
  Administered 2019-10-25: 30 mg via INTRAVENOUS
  Administered 2019-10-25: 100 mg via INTRAVENOUS

## 2019-10-25 MED ORDER — SODIUM CHLORIDE FLUSH 0.9 % IV SOLN
INTRAVENOUS | Status: DC | PRN
  Administered 2019-10-25: 80 mL

## 2019-10-25 SURGICAL SUPPLY — 116 items
APPLIER CLIP ROT 10 11.4 M/L (STAPLE)
BLADE CLIPPER SURG (BLADE) IMPLANT
BLADE SURG SZ11 CARB STEEL (BLADE) IMPLANT
BNDG COHESIVE 6X5 TAN STRL LF (GAUZE/BANDAGES/DRESSINGS) ×3 IMPLANT
CANISTER SUCT 3000ML PPV (MISCELLANEOUS) ×6 IMPLANT
CANNULA REDUC XI 12-8 STAPL (CANNULA) ×6
CANNULA REDUCER 12-8 DVNC XI (CANNULA) ×4 IMPLANT
CATH THORACIC 28FR (CATHETERS) IMPLANT
CATH THORACIC 28FR RT ANG (CATHETERS) IMPLANT
CATH THORACIC 36FR (CATHETERS) IMPLANT
CATH THORACIC 36FR RT ANG (CATHETERS) IMPLANT
CLIP APPLIE ROT 10 11.4 M/L (STAPLE) IMPLANT
CLIP VESOCCLUDE MED 6/CT (CLIP) IMPLANT
CNTNR URN SCR LID CUP LEK RST (MISCELLANEOUS) ×28 IMPLANT
CONN ST 1/4X3/8  BEN (MISCELLANEOUS) ×3
CONN ST 1/4X3/8 BEN (MISCELLANEOUS) ×2 IMPLANT
CONN Y 3/8X3/8X3/8  BEN (MISCELLANEOUS)
CONN Y 3/8X3/8X3/8 BEN (MISCELLANEOUS) IMPLANT
CONT SPEC 4OZ STRL OR WHT (MISCELLANEOUS) ×42
DEFOGGER SCOPE WARMER CLEARIFY (MISCELLANEOUS) ×3 IMPLANT
DERMABOND ADVANCED (GAUZE/BANDAGES/DRESSINGS) ×1
DERMABOND ADVANCED .7 DNX12 (GAUZE/BANDAGES/DRESSINGS) ×2 IMPLANT
DRAIN CHANNEL 28F RND 3/8 FF (WOUND CARE) ×3 IMPLANT
DRAIN CHANNEL 32F RND 10.7 FF (WOUND CARE) IMPLANT
DRAPE ARM DVNC X/XI (DISPOSABLE) ×8 IMPLANT
DRAPE COLUMN DVNC XI (DISPOSABLE) ×2 IMPLANT
DRAPE CV SPLIT W-CLR ANES SCRN (DRAPES) ×3 IMPLANT
DRAPE DA VINCI XI ARM (DISPOSABLE) ×12
DRAPE DA VINCI XI COLUMN (DISPOSABLE) ×3
DRAPE INCISE IOBAN 66X45 STRL (DRAPES) IMPLANT
DRAPE ORTHO SPLIT 77X108 STRL (DRAPES) ×3
DRAPE SURG ORHT 6 SPLT 77X108 (DRAPES) ×2 IMPLANT
DRAPE WARM FLUID 44X44 (DRAPES) IMPLANT
ELECT BLADE 6.5 EXT (BLADE) ×3 IMPLANT
ELECT REM PT RETURN 9FT ADLT (ELECTROSURGICAL) ×3
ELECTRODE REM PT RTRN 9FT ADLT (ELECTROSURGICAL) ×2 IMPLANT
GAUZE KITTNER 4X5 RF (MISCELLANEOUS) ×6 IMPLANT
GAUZE SPONGE 4X4 12PLY STRL (GAUZE/BANDAGES/DRESSINGS) ×3 IMPLANT
GLOVE BIO SURGEON STRL SZ 6 (GLOVE) ×3 IMPLANT
GLOVE BIOGEL PI IND STRL 6.5 (GLOVE) ×6 IMPLANT
GLOVE BIOGEL PI INDICATOR 6.5 (GLOVE) ×3
GLOVE TRIUMPH SURG SIZE 7.5 (KITS) ×6 IMPLANT
GOWN STRL REUS W/ TWL LRG LVL3 (GOWN DISPOSABLE) ×6 IMPLANT
GOWN STRL REUS W/ TWL XL LVL3 (GOWN DISPOSABLE) ×4 IMPLANT
GOWN STRL REUS W/TWL 2XL LVL3 (GOWN DISPOSABLE) ×3 IMPLANT
GOWN STRL REUS W/TWL LRG LVL3 (GOWN DISPOSABLE) ×9
GOWN STRL REUS W/TWL XL LVL3 (GOWN DISPOSABLE) ×6
HEMOSTAT SURGICEL 2X14 (HEMOSTASIS) ×6 IMPLANT
IRRIGATION STRYKERFLOW (MISCELLANEOUS) ×2 IMPLANT
IRRIGATOR STRYKERFLOW (MISCELLANEOUS) ×3
KIT BASIN OR (CUSTOM PROCEDURE TRAY) ×3 IMPLANT
KIT SUCTION CATH 14FR (SUCTIONS) IMPLANT
KIT TURNOVER KIT B (KITS) ×3 IMPLANT
LOOP VESSEL SUPERMAXI WHITE (MISCELLANEOUS) IMPLANT
NEEDLE HYPO 25GX1X1/2 BEV (NEEDLE) ×3 IMPLANT
NEEDLE SPNL 22GX3.5 QUINCKE BK (NEEDLE) ×3 IMPLANT
NS IRRIG 1000ML POUR BTL (IV SOLUTION) ×3 IMPLANT
PACK CHEST (CUSTOM PROCEDURE TRAY) ×3 IMPLANT
PAD ARMBOARD 7.5X6 YLW CONV (MISCELLANEOUS) ×6 IMPLANT
RELOAD STAPLER 2.5X45 WHT DVNC (STAPLE) ×2 IMPLANT
RELOAD STAPLER 3.5X45 BLU DVNC (STAPLE) ×20 IMPLANT
RELOAD STAPLER 4.3X45 GRN DVNC (STAPLE) ×2 IMPLANT
SCISSORS LAP 5X35 DISP (ENDOMECHANICALS) IMPLANT
SEAL CANN UNIV 5-8 DVNC XI (MISCELLANEOUS) ×4 IMPLANT
SEAL XI 5MM-8MM UNIVERSAL (MISCELLANEOUS) ×6
SEALANT PROGEL (MISCELLANEOUS) IMPLANT
SEALANT SURG COSEAL 4ML (VASCULAR PRODUCTS) IMPLANT
SEALANT SURG COSEAL 8ML (VASCULAR PRODUCTS) IMPLANT
SET TRI-LUMEN FLTR TB AIRSEAL (TUBING) ×3 IMPLANT
SET TUBE SMOKE EVAC HIGH FLOW (TUBING) IMPLANT
SHEARS HARMONIC HDI 20CM (ELECTROSURGICAL) IMPLANT
SHEET MEDIUM DRAPE 40X70 STRL (DRAPES) ×3 IMPLANT
SOLUTION ELECTROLUBE (MISCELLANEOUS) ×3 IMPLANT
SPECIMEN JAR MEDIUM (MISCELLANEOUS) IMPLANT
SPONGE INTESTINAL PEANUT (DISPOSABLE) IMPLANT
SPONGE TONSIL TAPE 1 RFD (DISPOSABLE) IMPLANT
STAPLER 45 SUREFORM CVD (STAPLE) ×3
STAPLER 45 SUREFORM CVD DVNC (STAPLE) ×2 IMPLANT
STAPLER CANNULA SEAL DVNC XI (STAPLE) ×4 IMPLANT
STAPLER CANNULA SEAL XI (STAPLE) ×6
STAPLER RELOAD 2.5X45 WHITE (STAPLE) ×3
STAPLER RELOAD 2.5X45 WHT DVNC (STAPLE) ×2
STAPLER RELOAD 3.5X45 BLU DVNC (STAPLE) ×20
STAPLER RELOAD 3.5X45 BLUE (STAPLE) ×30
STAPLER RELOAD 4.3X45 GREEN (STAPLE) ×3
STAPLER RELOAD 4.3X45 GRN DVNC (STAPLE) ×2
SUT PDS AB 3-0 SH 27 (SUTURE) IMPLANT
SUT PROLENE 4 0 RB 1 (SUTURE)
SUT PROLENE 4-0 RB1 .5 CRCL 36 (SUTURE) IMPLANT
SUT SILK  1 MH (SUTURE) ×6
SUT SILK 1 MH (SUTURE) ×4 IMPLANT
SUT SILK 1 TIES 10X30 (SUTURE) ×3 IMPLANT
SUT SILK 2 0 SH (SUTURE) ×6 IMPLANT
SUT SILK 2 0SH CR/8 30 (SUTURE) IMPLANT
SUT SILK 3 0 SH 30 (SUTURE) IMPLANT
SUT SILK 3 0SH CR/8 30 (SUTURE) IMPLANT
SUT VIC AB 1 CTX 36 (SUTURE)
SUT VIC AB 1 CTX36XBRD ANBCTR (SUTURE) IMPLANT
SUT VIC AB 2-0 CTX 36 (SUTURE) IMPLANT
SUT VIC AB 3-0 MH 27 (SUTURE) IMPLANT
SUT VIC AB 3-0 X1 27 (SUTURE) ×9 IMPLANT
SUT VICRYL 0 TIES 12 18 (SUTURE) ×3 IMPLANT
SUT VICRYL 0 UR6 27IN ABS (SUTURE) ×6 IMPLANT
SUT VICRYL 2 TP 1 (SUTURE) IMPLANT
SYR 20ML ECCENTRIC (SYRINGE) ×3 IMPLANT
SYR 30ML LL (SYRINGE) ×3 IMPLANT
SYSTEM RETRIEVAL ANCHOR 8 (MISCELLANEOUS) ×3 IMPLANT
SYSTEM SAHARA CHEST DRAIN ATS (WOUND CARE) ×3 IMPLANT
TAPE CLOTH 4X10 WHT NS (GAUZE/BANDAGES/DRESSINGS) ×3 IMPLANT
TAPE CLOTH SURG 4X10 WHT LF (GAUZE/BANDAGES/DRESSINGS) ×3 IMPLANT
TIP APPLICATOR SPRAY EXTEND 16 (VASCULAR PRODUCTS) IMPLANT
TOWEL GREEN STERILE (TOWEL DISPOSABLE) ×3 IMPLANT
TRAY FOLEY MTR SLVR 16FR STAT (SET/KITS/TRAYS/PACK) ×3 IMPLANT
TROCAR BLADELESS 15MM (ENDOMECHANICALS) IMPLANT
TROCAR XCEL 12X100 BLDLESS (ENDOMECHANICALS) ×3 IMPLANT
WATER STERILE IRR 1000ML POUR (IV SOLUTION) ×3 IMPLANT

## 2019-10-25 NOTE — Anesthesia Postprocedure Evaluation (Signed)
Anesthesia Post Note  Patient: Teresa Booker  Procedure(s) Performed: XI ROBOTIC ASSISTED THORACOSCOPY-SUPERIOR SEGMENTECTOMY (Right Chest) NODE DISSECTION (Right ) INTERCOSTAL NERVE BLOCK (Right )     Patient location during evaluation: PACU Anesthesia Type: General Level of consciousness: patient cooperative, oriented and sedated Pain management: pain level controlled Vital Signs Assessment: post-procedure vital signs reviewed and stable Respiratory status: spontaneous breathing, nonlabored ventilation, respiratory function stable and patient connected to nasal cannula oxygen Cardiovascular status: blood pressure returned to baseline and stable Postop Assessment: no apparent nausea or vomiting Anesthetic complications: no   No complications documented.  Last Vitals:  Vitals:   10/25/19 1240 10/25/19 1325  BP: (!) 151/69 (!) 144/66  Pulse: 80 81  Resp: 11 14  Temp:    SpO2: 99% 95%    Last Pain:  Vitals:   10/25/19 1325  TempSrc:   PainSc: Asleep                 Sully Manzi,E. Calleigh Lafontant

## 2019-10-25 NOTE — Op Note (Signed)
NAME: Teresa Booker, NIDAY MEDICAL RECORD [id number] ACCOUNT [id number] DATE OF BIRTH:[birth year] FACILITY: [facility] LOCATION: [facility] PHYSICIAN:Asahd Can Chaya Jan, MD  OPERATIVE REPORT  DATE OF PROCEDURE:  10/25/2019  PREOPERATIVE DIAGNOSIS:  Right lower lobe lung nodule.  POSTOPERATIVE DIAGNOSIS:  Nonsmall cell carcinoma, right lower lobe, clinical stage IA (T1 N0).  PROCEDURE:   1.  Xi robotic-assisted right thoracoscopy.  2.  Right lower lobe superior segmentectomy.   3.  Lymph node dissection. 4.  Intercostal nerve blocks levels 3 through 10.  SURGEON:  Modesto Charon, MD  ASSISTANT:  Nicholes Rough, PA-C  ANESTHESIA:  General.  FINDINGS:  Frozen section revealed nonsmall cell carcinoma.  Bronchial margin negative for tumor.  CLINICAL NOTE:  Teresa Booker is a 74 year old former smoker, who was originally found to have a lung nodule on a CT done for a coronary calcium score.  On followup, the nodule was primarily subsolid, but there was possibly a small solid component.  On PET  CT, there was mild metabolic activity within SUV of 2.1.  She was offered the option of continued radiographic followup versus surgical resection.  The indications, risks, benefits, and alternatives were discussed in detail with the patient.  She  strongly preferred definitive surgical resection for diagnosis and treatment.  The indications, risks, benefits, and alternatives were discussed in detail with the patient.  She understood and accepted the risks and agreed to proceed.  OPERATIVE NOTE:  Teresa Booker was brought to the preoperative holding area on 10/25/2019.  Anesthesia placed a central venous catheter and an arterial blood pressure monitoring line.  She was taken to the operating room, anesthetized and intubated with a  double lumen endotracheal tube.  A Foley catheter was placed.  Intravenous antibiotics were administered.  Sequential compression devices were placed on the calves for DVT prophylaxis.  She  was placed in a left lateral decubitus position and the right  chest was prepped and draped in the usual sterile fashion.  Single-lung ventilation of the left lung was initiated and was tolerated well throughout the procedure.  A timeout was performed.  A solution containing 20 mL of liposomal bupivacaine, 30 mL of 0.5% bupivacaine and 50 mL of saline was prepared.  This was used for direct local at the incisions, as well as for the intercostal nerve blocks.  An incision was  made in the 8th interspace in the mid axillary line.  An 8 mm port was inserted.  The thoracoscope was advanced into the chest.  The pleural space was entered and there were no adhesions.  Carbon dioxide was insufflated per protocol.  Three additional  ports were placed for robotic arms.  12 mm ports were placed in the 7th interspace anterior and 8th interspace posterior to the camera port, and then further posteriorly, an 8 mm port was placed for retraction.  The 12 mm AirSeal assistant port was placed  in the 10th interspace centered between the 2 anterior ports.  Intercostal nerve blocks were performed from the 3rd to the 10th interspace.  Ten mL of the liposomal bupivacaine solution was injected into a subpleural plane at each level.  The robot then  was deployed, the camera arm was docked, targeting was performed.  The remaining ports were docked.  The instruments were inserted with thoracoscopic visualization.  From the console, the lower lobe was retracted superiorly.  The inferior ligament was divided.  All lymph nodes that were encountered during the dissection were removed and sent as separate specimens for permanent pathology.  The pleural reflection was  divided at the hilum posteriorly.  Level 8 and 7 nodes were removed.  The dissection was performed on the level 11 node, which was removed as well.  The pleural reflection was divided more superiorly.  The pleura overlying the mediastinum superior to the  azygos vein was  incised and level 4R node was removed from that area.  There was some minor bleeding and Surgicel was placed into that site as well as into the site from the subcarinal node dissection.   Dissection was carried to the hilum.  The pleura  overlying the fissure between the lower and middle lobes was divided and the basilar segmental pulmonary artery was identified.  Dissection then was carried out superior to the basilar segmental artery and the origin of the superior segmental artery was  identified.  The fissure then was completed between the superior segment and the upper lobe with sequential firings of the robotic stapler using blue cartridges.  The superior segmental arterial branch then was dissected out and was divided with a  vascular cartridge.  The superior segmental bronchus was identified.  It was dissected out.  A blue cartridge was used on the stapler.  The stapler was placed across the superior segmental bronchus and closed.  A test inflation showed good aeration of  the remainder of the lower lobe and the stapler was fired, transecting the lower lobe bronchus.  The superior segmental vein was very small and it was incorporated with the parenchymal staple lines.  Intravenous ICG was administered.  The Firefly camera was  used to identify the demarcation of the superior segment from the basilar segments.  This area was marked with cautery and then the segmentectomy was completed with sequential firings of the robotic stapler using both blue and green cartridges, taking a  small rim of the basilar segmental tissue along with the specimen to ensure that the entire superior segment had been removed.  The specimen was placed into an 8 mm retrieval bag and removed.  The lesion was marked, as was the bronchus.  It was sent for  frozen section.  That subsequently returned showing a nonsmall cell carcinoma.  The bronchial margin was negative for tumor.  The chest was copiously irrigated with warm saline.   A test inflation to 30 cm of water revealed no leakage from the staple  lines or the bronchial stump.  Final inspection was made for hemostasis.  All of the sponges were removed.  The robotic instruments were removed.  The robot was undocked.  A 28-French Blake drain was placed in the most anterior incision in the 7th  interspace port and secured with a #silk suture.  Dual-lung ventilation was resumed.  The remaining incisions were closed with 0 Vicryl fascial sutures and 3-0 Vicryl subcuticular sutures.  Dermabond was applied.  The chest tube was placed to a  Pleur-evac on waterseal.  The patient then was extubated in the operating room and taken to the [facility] in good condition.  All sponge, needle and instrument counts were correct at the end of the procedure.  VN/NUANCE  D:10/25/2019 T:10/25/2019 JOB:[id number]

## 2019-10-25 NOTE — Anesthesia Procedure Notes (Signed)
Procedure Name: Intubation Date/Time: 10/25/2019 7:55 AM Performed by: Valda Favia, CRNA Pre-anesthesia Checklist: Patient identified, Emergency Drugs available, Suction available, Patient being monitored and Timeout performed Patient Re-evaluated:Patient Re-evaluated prior to induction Oxygen Delivery Method: Circle system utilized Preoxygenation: Pre-oxygenation with 100% oxygen Induction Type: IV induction Ventilation: Mask ventilation without difficulty and Oral airway inserted - appropriate to patient size Laryngoscope Size: Mac and 4 Grade View: Grade I Endobronchial tube: Left, Double lumen EBT, EBT position confirmed by auscultation and EBT position confirmed by fiberoptic bronchoscope and 37 Fr Number of attempts: 1 Airway Equipment and Method: Stylet Placement Confirmation: ETT inserted through vocal cords under direct vision,  positive ETCO2 and breath sounds checked- equal and bilateral Tube secured with: Tape Dental Injury: Teeth and Oropharynx as per pre-operative assessment  Comments: Viva sight tube

## 2019-10-25 NOTE — Brief Op Note (Signed)
      [address]       [place] [address]             [phone]       10/25/2019  10:56 AM  PATIENT:  Teresa Booker  74 y.o. female  PRE-OPERATIVE DIAGNOSIS:  RIGHT LOWER LOBE LUNG NODULE  POST-OPERATIVE DIAGNOSIS:  RIGHT LOWER LOBE LUNG NODULE  PROCEDURE:  Procedure(s): XI ROBOTIC ASSISTED THORACOSCOPY-SUPERIOR SEGMENTECTOMY (Right) NODE DISSECTION (Right) INTERCOSTAL NERVE BLOCK (Right)  SURGEON:  Surgeon(s) and Role:    [name], MD - Primary  PHYSICIAN ASSISTANT:   Nicholes Rough, PA-C    ANESTHESIA:   general  EBL:  30 mL   BLOOD ADMINISTERED:none  DRAINS: ONE BLAKE CHEST TUBE DRAIN   LOCAL MEDICATIONS USED:  BUPIVICAINE   SPECIMEN:  Source of Specimen:  RIGHT SUPERIOR SEGMENTECTOMY  DISPOSITION OF SPECIMEN:  PATHOLOGY  COUNTS:  YES  DICTATION: .Dragon Dictation  PLAN OF CARE: Admit to inpatient   PATIENT DISPOSITION:  PACU - hemodynamically stable.   Delay start of Pharmacological VTE agent (>24hrs) due to surgical blood loss or risk of bleeding: no

## 2019-10-25 NOTE — Addendum Note (Signed)
Addendum  created 10/25/19 1830 by Valda Favia, CRNA   Charge Capture section accepted

## 2019-10-25 NOTE — Transfer of Care (Signed)
Immediate Anesthesia Transfer of Care Note  Patient: Teresa Booker  Procedure(s) Performed: XI ROBOTIC ASSISTED THORACOSCOPY-SUPERIOR SEGMENTECTOMY (Right Chest) NODE DISSECTION (Right ) INTERCOSTAL NERVE BLOCK (Right )  Patient Location: PACU  Anesthesia Type:General  Level of Consciousness: drowsy  Airway & Oxygen Therapy: Patient Spontanous Breathing and Patient connected to nasal cannula oxygen  Post-op Assessment: Report given to RN and Post -op Vital signs reviewed and stable  Post vital signs: Reviewed and stable  Last Vitals:  Vitals Value Taken Time  BP 151/67 10/25/19 1125  Temp    Pulse 83 10/25/19 1128  Resp 12 10/25/19 1128  SpO2 99 % 10/25/19 1128  Vitals shown include unvalidated device data.  Last Pain:  Vitals:   10/25/19 0613  TempSrc:   PainSc: 0-No pain      Patients Stated Pain Goal: 3 (16/10/96 0454)  Complications: No complications documented.

## 2019-10-25 NOTE — Anesthesia Procedure Notes (Signed)
Arterial Line Insertion Performed by: Valda Favia, CRNA, CRNA  Patient location: Pre-op. Preanesthetic checklist: patient identified, IV checked, site marked, risks and benefits discussed, surgical consent, monitors and equipment checked, pre-op evaluation, timeout performed and anesthesia consent Patient sedated Left, radial was placed Catheter size: 20 G Hand hygiene performed , maximum sterile barriers used  and Seldinger technique used Allen's test indicative of satisfactory collateral circulation Attempts: 1 Procedure performed without using ultrasound guided technique. Following insertion, dressing applied and Biopatch. Post procedure assessment: normal  Patient tolerated the procedure well with no immediate complications.

## 2019-10-25 NOTE — Interval H&P Note (Signed)
History and Physical Interval Note:  10/25/2019 7:25 AM  Teresa Booker  has presented today for surgery, with the diagnosis of RLL LUNG NODULE.  The various methods of treatment have been discussed with the patient and family. After consideration of risks, benefits and other options for treatment, the patient has consented to  Procedure(s): XI ROBOTIC ASSISTED THORACOSCOPY-SUPERIOR SEGMENTECTOMY (Right) as a surgical intervention.  The patient's history has been reviewed, patient examined, no change in status, stable for surgery.  I have reviewed the patient's chart and labs.  Questions were answered to the patient's satisfaction.     [name]

## 2019-10-26 ENCOUNTER — Inpatient Hospital Stay (HOSPITAL_COMMUNITY): Payer: Medicare HMO

## 2019-10-26 ENCOUNTER — Encounter (HOSPITAL_COMMUNITY): Payer: Self-pay | Admitting: Thoracic Surgery (Cardiothoracic Vascular Surgery)

## 2019-10-26 LAB — BASIC METABOLIC PANEL
Anion gap: 7 (ref 5–15)
BUN: 9 mg/dL (ref 8–23)
CO2: 25 mmol/L (ref 22–32)
Calcium: 8.6 mg/dL — ABNORMAL LOW (ref 8.9–10.3)
Chloride: 105 mmol/L (ref 98–111)
Creatinine, Ser: 0.89 mg/dL (ref 0.44–1.00)
GFR calc Af Amer: >60 mL/min (ref >60)
GFR calc non Af Amer: >60 mL/min (ref >60)
Glucose, Bld: 135 mg/dL — ABNORMAL HIGH (ref 70–99)
Potassium: 3.8 mmol/L (ref 3.5–5.1)
Sodium: 137 mmol/L (ref 135–145)

## 2019-10-26 LAB — CBC
HCT: 30.4 % — ABNORMAL LOW (ref 36.0–46.0)
Hemoglobin: 9.9 g/dL — ABNORMAL LOW (ref 12.0–15.0)
MCH: 30.7 pg (ref 26.0–34.0)
MCHC: 32.6 g/dL (ref 30.0–36.0)
MCV: 94.1 fL (ref 80.0–100.0)
Platelets: 197 10*3/uL (ref 150–400)
RBC: 3.23 MIL/uL — ABNORMAL LOW (ref 3.87–5.11)
RDW: 13 % (ref 11.5–15.5)
WBC: 10 10*3/uL (ref 4.0–10.5)
nRBC: 0 % (ref 0.0–0.2)

## 2019-10-26 LAB — BLOOD GAS, ARTERIAL
Acid-Base Excess: 0.9 mmol/L (ref 0.0–2.0)
Bicarbonate: 25 mmol/L (ref 20.0–28.0)
FIO2: 21
O2 Saturation: 98 %
Patient temperature: 37
pCO2 arterial: 40.1 mmHg (ref 32.0–48.0)
pH, Arterial: 7.412 (ref 7.350–7.450)
pO2, Arterial: 103 mmHg (ref 83.0–108.0)

## 2019-10-26 LAB — GLUCOSE, CAPILLARY
Glucose-Capillary: 109 mg/dL — ABNORMAL HIGH (ref 70–99)
Glucose-Capillary: 161 mg/dL — ABNORMAL HIGH (ref 70–99)

## 2019-10-26 LAB — SURGICAL PATHOLOGY

## 2019-10-26 MED ORDER — HYDROXYZINE HCL 25 MG PO TABS
25.0000 mg | ORAL_TABLET | Freq: Every day | ORAL | Status: DC
  Administered 2019-10-26 – 2019-10-27 (×2): 25 mg via ORAL
  Filled 2019-10-26 (×2): qty 1

## 2019-10-26 MED ORDER — CALCIUM CARBONATE 1250 (500 CA) MG PO TABS
1250.0000 mg | ORAL_TABLET | Freq: Every evening | ORAL | Status: DC
  Administered 2019-10-27: 1250 mg via ORAL
  Filled 2019-10-26 (×3): qty 1

## 2019-10-26 MED ORDER — LORATADINE 10 MG PO TABS: 10.0000 mg | ORAL_TABLET | Freq: Every day | ORAL | Status: DC | PRN

## 2019-10-26 MED ORDER — METOPROLOL SUCCINATE ER 25 MG PO TB24
25.0000 mg | ORAL_TABLET | Freq: Every day | ORAL | Status: DC
  Administered 2019-10-26 – 2019-10-27 (×2): 25 mg via ORAL
  Filled 2019-10-26 (×2): qty 1

## 2019-10-26 MED ORDER — INFLUENZA VAC A&B SA ADJ QUAD 0.5 ML IM PRSY
0.5000 mL | PREFILLED_SYRINGE | INTRAMUSCULAR | Status: DC
  Filled 2019-10-26: qty 0.5

## 2019-10-26 MED ORDER — L-LYSINE 500 MG PO TABS: 500.0000 mg | ORAL_TABLET | Freq: Every day | ORAL | Status: DC

## 2019-10-26 NOTE — Progress Notes (Signed)
PHARMACIST - PHYSICIAN ORDER COMMUNICATION  CONCERNING: P&T Medication Policy on Herbal Medications  DESCRIPTION:  This patient's order for:  L-Lysine  has been noted.  This product(s) is classified as an "herbal" or natural product. Due to a lack of definitive safety studies or FDA approval, nonstandard manufacturing practices, plus the potential risk of unknown drug-drug interactions while on inpatient medications, the Pharmacy and Therapeutics Committee does not permit the use of "herbal" or natural products of this type within [facility].   ACTION TAKEN: The pharmacy department is unable to verify this order at this time and your patient has been informed of this safety policy. Please reevaluate patient's clinical condition at discharge and address if the herbal or natural product(s) should be resumed at that time.

## 2019-10-26 NOTE — Progress Notes (Addendum)
      [address]       [place] [address]             [phone]      1 Day Post-Op Procedure(s) (LRB): XI ROBOTIC ASSISTED THORACOSCOPY-SUPERIOR SEGMENTECTOMY (Right) NODE DISSECTION (Right) INTERCOSTAL NERVE BLOCK (Right)   Subjective:  Patient with a little more pain this morning.  She thinks the block may be wearing off.  She had some nausea which relieved after she ate some crackers.  Questions if foley will be removed today  Objective: Vital signs in last 24 hours: Temp:  [97 F (36.1 C)-98.3 F (36.8 C)] 97.9 F (36.6 C) (09/14 0400) Pulse Rate:  [77-90] 77 (09/14 0415) Cardiac Rhythm: Normal sinus rhythm (09/14 0718) Resp:  [11-17] 14 (09/14 0415) BP: (124-167)/(54-83) 135/59 (09/14 0400) SpO2:  [94 %-100 %] 95 % (09/14 0415) Arterial Line BP: (107-198)/(27-69) 164/48 (09/14 0600)  Intake/Output from previous day: 09/13 0701 - 09/14 0700 In: 2777.3 [P.O.:480; I.V.:2097.3; IV Piggyback:200] Out: 3302 [Urine:2950; Blood:30; Chest Tube:322]  General appearance: alert, cooperative and no distress Heart: regular rate and rhythm Lungs: clear to auscultation bilaterally Abdomen: soft, non-tender; bowel sounds normal; no masses,  no organomegaly Extremities: extremities normal, atraumatic, no cyanosis or edema Wound: clean and dry  Lab Results: Recent Labs    10/26/19 0415  WBC 10.0  HGB 9.9*  HCT 30.4*  PLT 197   BMET:  Recent Labs    10/26/19 0415  NA 137  K 3.8  CL 105  CO2 25  GLUCOSE 135*  BUN 9  CREATININE 0.89  CALCIUM 8.6*    PT/INR: No results for input(s): LABPROT, INR in the last 72 hours. ABG    Component Value Date/Time   PHART 7.412 10/26/2019 0415   HCO3 25.0 10/26/2019 0415   O2SAT 98.0 10/26/2019 0415   CBG (last 3)  Recent Labs    10/25/19 1855 10/25/19 2034 10/26/19 0617  GLUCAP 173* 180* 109*    Assessment/Plan: S/P Procedure(s) (LRB): XI ROBOTIC ASSISTED THORACOSCOPY-SUPERIOR SEGMENTECTOMY  (Right) NODE DISSECTION (Right) INTERCOSTAL NERVE BLOCK (Right)  1. CV- NSR, mild HTN- will restart home Toprol XL at reduced dose 2. Pulm- no air leak present on water seal, CXR with trace apical pneumothorax, mild atelectasis- will defer to Dr. Roxan Hockey but possible chest tube removal today 3. D/C Foley Catheter 4. Lovenox for DVT prophylaxis 5. Stop IV fluid 6. Stop SSIP as patient not a diabetic 7. Dispo- patient stable, some post operative pain not unexpected, relieves with medication, restart home Toprol XL for better BP control, possibly d/c chest tube today will defer to Dr. Roxan Hockey   LOS: 1 day    Ellwood Handler, PA-C 10/26/2019 Patient seen and examined, agree with above When I examined her she had a tiny intermittent air leak with cough- will leave tube on water seal today Plan as above  Remo Lipps C. Roxan Hockey, MD [facility] [phone]

## 2019-10-27 ENCOUNTER — Inpatient Hospital Stay (HOSPITAL_COMMUNITY): Payer: Medicare HMO

## 2019-10-27 LAB — CBC
HCT: 31.9 % — ABNORMAL LOW (ref 36.0–46.0)
Hemoglobin: 10.1 g/dL — ABNORMAL LOW (ref 12.0–15.0)
MCH: 29.9 pg (ref 26.0–34.0)
MCHC: 31.7 g/dL (ref 30.0–36.0)
MCV: 94.4 fL (ref 80.0–100.0)
Platelets: 194 10*3/uL (ref 150–400)
RBC: 3.38 MIL/uL — ABNORMAL LOW (ref 3.87–5.11)
RDW: 13.1 % (ref 11.5–15.5)
WBC: 9.7 10*3/uL (ref 4.0–10.5)
nRBC: 0 % (ref 0.0–0.2)

## 2019-10-27 LAB — COMPREHENSIVE METABOLIC PANEL
ALT: 21 U/L (ref 0–44)
AST: 28 U/L (ref 15–41)
Albumin: 3 g/dL — ABNORMAL LOW (ref 3.5–5.0)
Alkaline Phosphatase: 39 U/L (ref 38–126)
Anion gap: 7 (ref 5–15)
BUN: 13 mg/dL (ref 8–23)
CO2: 26 mmol/L (ref 22–32)
Calcium: 8.7 mg/dL — ABNORMAL LOW (ref 8.9–10.3)
Chloride: 104 mmol/L (ref 98–111)
Creatinine, Ser: 0.88 mg/dL (ref 0.44–1.00)
GFR calc Af Amer: >60 mL/min (ref >60)
GFR calc non Af Amer: >60 mL/min (ref >60)
Glucose, Bld: 103 mg/dL — ABNORMAL HIGH (ref 70–99)
Potassium: 4.1 mmol/L (ref 3.5–5.1)
Sodium: 137 mmol/L (ref 135–145)
Total Bilirubin: 0.7 mg/dL (ref 0.3–1.2)
Total Protein: 5.5 g/dL — ABNORMAL LOW (ref 6.5–8.1)

## 2019-10-27 LAB — GLUCOSE, CAPILLARY
Glucose-Capillary: 104 mg/dL — ABNORMAL HIGH (ref 70–99)
Glucose-Capillary: 138 mg/dL — ABNORMAL HIGH (ref 70–99)

## 2019-10-27 NOTE — Progress Notes (Signed)
Radiologist called for showing CXR 20 % of pneumothorax after chest tube removed. PA Tessa notified regarding this matter and patient denied SOB or difficult breathing. PA Tessa wanted to monitor and will check CXR tomorrow then decided what to do. HS [employer]

## 2019-10-27 NOTE — Discharge Summary (Addendum)
Teresa Booker,Teresa Booker             [phone]      Physician Discharge Summary  Patient ID: AYSIAH JURADO MRN: [id number] DOB/AGE: 72-04-1945 72 y.o.  Admit date: 10/25/2019 Discharge date: 10/28/2019  Admission Diagnoses: Right lower lobe lung nodule  Patient Active Problem List   Diagnosis Date Noted  . Lung nodule 10/01/2019  . Hyperlipidemia 08/31/2014  . Elevated BP 11/18/2012  . Abdominal pain, RUQ 08/14/2010  . ABDOMINAL BRUIT 06/27/2008  . RIGHT BUNDLE BRANCH BLOCK 06/24/2008  . Allergic rhinitis 09/09/2007  . IBS 08/13/2007  . ROTATOR CUFF SYNDROME, RIGHT 08/13/2007  . Elevated lipids 06/24/2007    Discharge Diagnoses:  Active Problems:   S/P robot-assisted surgical procedure   Adenocarcinoma of lung, stage 1, right [facility])   Discharged Condition: good  HPI:   Teresa Booker is a 74 year old former smoker with a history of fibromyalgia, reflux, hyperlipidemia, irritable bowel syndrome, remote alcohol abuse, and anxiety.  She had a CT done for coronary calcium score back in February.  She was noted to have a 13 x 19 mm groundglass nodularity in the superior segment of the right lower lobe.  A follow-up CT of the chest to more accurately measured the nodule 2.3 x 1.8 cm.  The nodule had mixed density but was primarily subsolid.  A repeat CT was done in August which showed the nodule had not changed significantly.  A PET/CT showed mild metabolic activity with an SUV of 2.1.  She smoked about a pack of cigarettes daily starting at age 74.  She quit 20 years ago.  Overall about a 30-pack-year history.  She is not having any cough, hemoptysis, wheezing, chest pain, tightness, pressure, or shortness of breath with exertion.  Her appetite is good.  She has lost about 5 pounds over the past 3 months with dietary changes.  She is retired.  She lives with her domestic partner.  She has been very anxious about this nodule and each scan as it is  done.   Hospital Course:  On 10/25/2019 Ms. Dundon underwent a  Robotic assisted right lower lobe superior segmentectomy with Dr. Roxan Hockey. She tolerated the procedure well and was transferred to the surgical ICU for continued care. Her chest tube was left to water seal POD 1 due to a small air leak. POD 2 her air leak resolved and her chest xray was stable. We discontinued her chest tube. Her follow-up CXR showed: Postoperative changes on the right. Continued right pneumothorax of approximately 10-15%. She did have some post-op nausea which improved throughout the day.   Today, she is tolerating room air, her incisions are healing well, she is ambulating with limited assistance, and she is ready for discharge home.   Consults: None  Significant Diagnostic Studies:   CLINICAL DATA:  Post partial lobectomy  EXAM: CHEST - 2 VIEW  COMPARISON:  10/27/2019  FINDINGS: Continued right apical pneumothorax, decreased in size, now approximately 10-15%. Left lung clear. Postoperative atelectasis in the right mid lung. No effusions.  IMPRESSION: Postoperative changes on the right. Continued right pneumothorax of approximately 10-15%.   Electronically Signed   By: Rolm Baptise M.D.   On: 10/28/2019 08:09  Treatments:   NAME: CRYSTOL, WALPOLE MEDICAL RECORD [id number] ACCOUNT [id number] DATE OF BIRTH:1945/07/21 FACILITY: [facility] LOCATION: [facility] PHYSICIAN:Patrizia Paule Chaya Jan, MD  OPERATIVE REPORT  DATE OF PROCEDURE:  10/25/2019  PREOPERATIVE  DIAGNOSIS:  Right lower lobe lung nodule.  POSTOPERATIVE DIAGNOSIS:  Nonsmall cell carcinoma, right lower lobe, clinical stage IA (T1 N0).  PROCEDURE:   1.  Xi robotic video-assisted right thoracoscopy.  2.  Right lower lobe superior segmentectomy.   3.  Lymph node dissection. 4.  Intercostal nerve blocks at levels 3 through 10.  SURGEON:  Modesto Charon, MD  ASSISTANT:  Nicholes Rough, PA-C  ANESTHESIA:   General.  FINDINGS:  Frozen section revealed nonsmall cell carcinoma.  Bronchial margin negative for tumor.   Discharge Exam: Blood pressure (!) 158/70, pulse 87, temperature 98.1 F (36.7 C), temperature source Oral, resp. rate 17, height 5\' 5"  (1.651 m), weight 63.5 kg, SpO2 95 %.   General appearance: alert, cooperative and no distress Neurologic: intact Heart: regular rate and rhythm Lungs: diminished breath sounds bibasilar  Disposition: Discharge disposition: 01-Home or Self Care        Allergies as of 10/28/2019      Reactions   Codeine    Makes heart race   Penicillins    unknown   Sulfonamide Derivatives    Has a rash with the use of silvadiene for burn   Aspirin    Upset stomach, High dose only      Medication List    STOP taking these medications   Echinacea 125 MG Caps     TAKE these medications   acetaminophen 500 MG tablet Commonly known as: TYLENOL Take 2 tablets (1,000 mg total) by mouth every 6 (six) hours.   aspirin EC 81 MG tablet Take 81 mg by mouth daily. Swallow whole.   Calcium 200 MG Tabs Take 200 mg by mouth every evening.   hydrOXYzine 25 MG tablet Commonly known as: ATARAX/VISTARIL Take 1 tablet (25 mg total) by mouth every 8 (eight) hours as needed. What changed: when to take this   hyoscyamine 0.375 MG 12 hr tablet Commonly known as: LEVBID Take 1 tablet by mouth twice daily What changed: when to take this   L-Lysine 500 MG Tabs Take 500 mg by mouth daily.   loratadine 10 MG tablet Commonly known as: CLARITIN Take 1 tablet (10 mg total) by mouth daily.   Magnesium 250 MG Tabs Take 250 mg by mouth every evening.   metoprolol succinate 50 MG 24 hr tablet Commonly known as: TOPROL-XL TAKE 1 TABLET BY MOUTH ONCE DAILY WITH A MEAL OR  IMMEDIATELY  FOLLOWING  A  MEAL What changed: See the new instructions.   multivitamin with minerals Tabs tablet Take 1 tablet by mouth daily.   omeprazole 20 MG capsule Commonly  known as: PRILOSEC Take 20 mg by mouth daily before breakfast.   rosuvastatin 10 MG tablet Commonly known as: CRESTOR Take 1 tablet (10 mg total) by mouth at bedtime.   traMADol 50 MG tablet Commonly known as: ULTRAM Take 1 tablet (50 mg total) by mouth every 8 (eight) hours as needed (mild pain).       Follow-up Information    [name], MD. Call in 1 day(s).   Specialty: Family Medicine Contact information: [place] [address] [phone]        Josue Hector, MD .   Specialty: Cardiology Contact information: [phone] [address] [place] [address] [phone]        [name], MD Follow up.   Specialty: Cardiothoracic Surgery Why: Your appointment is on 9/28 at 2:30pm. Please arrive at 2pm for a chest xray  which is located at [facility] which is on the first floor of our building.  Contact information: [address] [place] [address] [phone]               Signed: Elgie Collard 10/28/2019, 12:07 PM

## 2019-10-27 NOTE — Discharge Instructions (Signed)
Robot-Assisted Thoracic Surgery    Robot-assisted thoracic surgery is a procedure in which robotic arms and surgical instruments are used to perform complex procedures through small incisions. During surgery, the surgeon sits at a console in the operating room and uses controls at the console to move the robotic arms. Surgical instruments are attached to the ends of the robotic arms. Instruments include a tool with a light and camera on the end of it (thoracoscope). The camera sends images to a video monitor that your surgeon will use to view the inside of your thoracic area. The thoracic area is between the neck and abdomen. Robot-assisted surgery may be done to:  Remove a tissue sample to be tested in a lab (biopsy).  Remove a part of a lung (lobectomy) or the whole lung (pneumonectomy).  Remove tumors.  Treat other conditions that affect: ? Your esophagus. This is the part of your body that carries food and liquid from your mouth to your stomach. ? Your diaphragm. This is a muscle wall between your lungs and stomach area. It helps with breathing.  Remove a portion of the nerves (sympathectomy) that cause excessive sweating. Tell a health care provider about:  Any allergies you have. This is especially important if you have allergies to medicines or sedatives.  All medicines you are taking, including vitamins, herbs, eye drops, creams, and over-the-counter medicines.  Any problems you or family members have had with anesthetic medicines.  Any blood disorders you have.  Any surgeries you have had.  Any medical conditions you have.  Whether you are pregnant or may be pregnant. What are the risks? Generally, this is a safe procedure. However, problems may occur, including:  Infection, such as pneumonia.  Severe bleeding (hemorrhage).  Allergic reactions to medicines.  Damage to organs or structures such as nerves or blood vessels. What happens before the  procedure? Medicines  Ask your health care provider about: ? Changing or stopping your regular medicines. This is especially important if you are taking diabetes medicines or blood thinners. ? Taking medicines such as aspirin and ibuprofen. These medicines can thin your blood. Do not take these medicines unless your health care provider tells you to take them. ? Taking over-the-counter medicines, vitamins, herbs, and supplements.  Before you go into the operating room, you may be given antibiotic medicine to help prevent infection.  Talk with your health care provider about safe and effective ways to manage pain before and after your procedure. Pain management should fit your specific health needs. Staying hydrated Follow instructions from your health care provider about hydration, which may include:  Up to 2 hours before the procedure - you may continue to drink clear liquids, such as water, clear fruit juice, black coffee, and plain tea. Eating and drinking restrictions Follow instructions from your health care provider about eating and drinking, which may include:  8 hours before the procedure - stop eating heavy meals or foods such as meat, fried foods, or fatty foods.  6 hours before the procedure - stop eating light meals or foods, such as toast or cereal.  6 hours before the procedure - stop drinking milk or drinks that contain milk.  2 hours before the procedure - stop drinking clear liquids. General instructions  Ask your health care provider how your surgical site will be marked or identified.  You may have tests, such as: ? CT scan. ? Ultrasound. ? Chest X-ray. ? Electrocardiogram (ECG).  You may have a blood or urine   sample taken.  Do not use any products that contain nicotine or tobacco, such as cigarettes and e-cigarettes. If you need help quitting, ask your health care provider.  You may be asked to shower with a germ-killing soap.  Plan to have someone take  you home from the hospital or clinic.  Plan to have a responsible adult care for you for at least 24 hours after you leave the hospital or clinic. This is important. What happens during the procedure?  To lower your risk of infection: ? Your health care team will wash or sanitize their hands. ? Hair may be removed from the surgical area. ? Your skin will be washed with soap.  An IV will be inserted into one of your veins.  You will be given one or more of the following: ? A medicine to help you relax (sedative). ? A medicine to make you fall asleep (general anesthetic). ? A medicine that is injected into an area of your body to numb everything below the injection site (regional anesthetic).  One to four small incisions will be made. The number of incisions and the area where incisions are made will depend on the purpose of your procedure.  Necessary procedures will be performed using the robotic system.  A drainage tube may be placed to drain excess fluid from the surgical area.  Your incisions will be closed with stitches (sutures) or staples and covered with a bandage (dressing). The procedure may vary among health care providers and hospitals. What happens after the procedure?  Your blood pressure, heart rate, breathing rate, and blood oxygen level will be monitored until the medicines you were given have worn off.  You may have a drainage tube in place. It may stay in place for a few days after the procedure to monitor for signs of air or fluid buildup in your lungs.  You may have to wear compression stockings. These stockings help to prevent blood clots and reduce swelling in your legs. Summary  Robot-assisted thoracic surgery is a procedure in which robotic arms and surgical instruments are used to help perform complex procedures through small incisions. During surgery, the surgeon sits at a console in the operating room and uses controls at the console to move the robotic  arms.  Compared to surgery between the neck and abdomen that is done by hand (traditional thoracic surgery), robot-assisted surgery allows the surgeon to perform complex procedures more easily.  A drainage tube may be placed to drain excess fluid from the surgical area. The tube will be closely monitored for fluid or air buildup in your lungs. This information is not intended to replace advice given to you by your health care provider. Make sure you discuss any questions you have with your health care provider. Document Revised: 11/27/2018 Document Reviewed: 06/03/2016 Elsevier Patient Education  2020 Elsevier Inc.  

## 2019-10-27 NOTE — Progress Notes (Addendum)
      [address]       [place] [address]             [phone]      2 Days Post-Op Procedure(s) (LRB): XI ROBOTIC ASSISTED THORACOSCOPY-SUPERIOR SEGMENTECTOMY (Right) NODE DISSECTION (Right) INTERCOSTAL NERVE BLOCK (Right) Subjective: Feels better today compared to yesterday  Objective: Vital signs in last 24 hours: Temp:  [97.8 F (36.6 C)-98.5 F (36.9 C)] 98 F (36.7 C) (09/15 0740) Pulse Rate:  [78-81] 80 (09/15 0740) Cardiac Rhythm: Normal sinus rhythm (09/15 0720) Resp:  [16-22] 20 (09/15 0740) BP: (118-164)/(54-79) 118/54 (09/15 0740) SpO2:  [92 %-96 %] 95 % (09/15 0740)     Intake/Output from previous day: 09/14 0701 - 09/15 0700 In: 480 [P.O.:480] Out: 360 [Chest Tube:360] Intake/Output this shift: No intake/output data recorded.  General appearance: alert, cooperative and no distress Heart: regular rate and rhythm, S1, S2 normal, no murmur, click, rub or gallop Lungs: clear to auscultation bilaterally Abdomen: soft, non-tender; bowel sounds normal; no masses,  no organomegaly Extremities: extremities normal, atraumatic, no cyanosis or edema Wound: clean and dry  Lab Results: Recent Labs    10/26/19 0415 10/27/19 0022  WBC 10.0 9.7  HGB 9.9* 10.1*  HCT 30.4* 31.9*  PLT 197 194   BMET:  Recent Labs    10/26/19 0415 10/27/19 0022  NA 137 137  K 3.8 4.1  CL 105 104  CO2 25 26  GLUCOSE 135* 103*  BUN 9 13  CREATININE 0.89 0.88  CALCIUM 8.6* 8.7*    PT/INR: No results for input(s): LABPROT, INR in the last 72 hours. ABG    Component Value Date/Time   PHART 7.412 10/26/2019 0415   HCO3 25.0 10/26/2019 0415   O2SAT 98.0 10/26/2019 0415   CBG (last 3)  Recent Labs    10/25/19 2034 10/26/19 0617 10/26/19 2111  GLUCAP 180* 109* 161*    Assessment/Plan: S/P Procedure(s) (LRB): XI ROBOTIC ASSISTED THORACOSCOPY-SUPERIOR SEGMENTECTOMY (Right) NODE DISSECTION (Right) INTERCOSTAL NERVE BLOCK (Right)  1. CV-NSR in  the 80s, BP well controlled 2. Pulm- CXR stable, no air leak on exam but + fluid wave. On water seal. 3. Renal-creatinine 0.88, electrolytes okay 4. H and H stable 5. Endo- blood glucose well controlled.   Plan: Could possibly remove chest tube today. She feels much better today in regards to her pain and coughing. Home soon.     LOS: 2 days    Elgie Collard 10/27/2019 Patient seen and examined, agree with above Path- adenocarcinoma. T1N0, stage IA No air leak- dc chest tube Increase ambulation Home in AM  [place] C. Roxan Hockey, MD [facility] [phone]

## 2019-10-28 ENCOUNTER — Inpatient Hospital Stay (HOSPITAL_COMMUNITY): Payer: Medicare HMO

## 2019-10-28 DIAGNOSIS — C3491 Malignant neoplasm of unspecified part of right bronchus or lung: Secondary | ICD-10-CM | POA: Diagnosis present

## 2019-10-28 MED ORDER — LACTULOSE 10 GM/15ML PO SOLN
20.0000 g | Freq: Every day | ORAL | Status: DC | PRN
  Administered 2019-10-28: 20 g via ORAL
  Filled 2019-10-28: qty 30

## 2019-10-28 MED ORDER — TRAMADOL HCL 50 MG PO TABS
50.0000 mg | ORAL_TABLET | Freq: Three times a day (TID) | ORAL | 0 refills | Status: DC | PRN
Start: 2019-10-28 — End: 2019-11-09

## 2019-10-28 MED ORDER — ACETAMINOPHEN 500 MG PO TABS
1000.0000 mg | ORAL_TABLET | Freq: Four times a day (QID) | ORAL | 0 refills | Status: DC
Start: 2019-10-28 — End: 2020-05-01

## 2019-10-28 MED ORDER — METOPROLOL SUCCINATE ER 50 MG PO TB24
50.0000 mg | ORAL_TABLET | Freq: Every day | ORAL | Status: DC
  Administered 2019-10-28: 50 mg via ORAL
  Filled 2019-10-28: qty 1

## 2019-10-28 NOTE — Progress Notes (Signed)
3 Days Post-Op Procedure(s) (LRB): XI ROBOTIC ASSISTED THORACOSCOPY-SUPERIOR SEGMENTECTOMY (Right) NODE DISSECTION (Right) INTERCOSTAL NERVE BLOCK (Right) Subjective: Some nausea overnight No BM since surgery  Objective: Vital signs in last 24 hours: Temp:  [98 F (36.7 C)-98.2 F (36.8 C)] 98.1 F (36.7 C) (09/16 0733) Pulse Rate:  [76-90] 85 (09/16 0735) Cardiac Rhythm: Normal sinus rhythm (09/16 0424) Resp:  [12-20] 17 (09/16 0735) BP: (160-176)/(67-87) 174/84 (09/16 0735) SpO2:  [95 %-97 %] 95 % (09/16 0733)  Hemodynamic parameters for last 24 hours:    Intake/Output from previous day: 09/15 0701 - 09/16 0700 In: 790 [P.O.:780] Out: 40 [Chest Tube:40] Intake/Output this shift: No intake/output data recorded.  General appearance: alert, cooperative and no distress Neurologic: intact Heart: regular rate and rhythm Lungs: diminished breath sounds bibasilar  Lab Results: Recent Labs    10/26/19 0415 10/27/19 0022  WBC 10.0 9.7  HGB 9.9* 10.1*  HCT 30.4* 31.9*  PLT 197 194   BMET:  Recent Labs    10/26/19 0415 10/27/19 0022  NA 137 137  K 3.8 4.1  CL 105 104  CO2 25 26  GLUCOSE 135* 103*  BUN 9 13  CREATININE 0.89 0.88  CALCIUM 8.6* 8.7*    PT/INR: No results for input(s): LABPROT, INR in the last 72 hours. ABG    Component Value Date/Time   PHART 7.412 10/26/2019 0415   HCO3 25.0 10/26/2019 0415   O2SAT 98.0 10/26/2019 0415   CBG (last 3)  Recent Labs    10/26/19 2111 10/27/19 1211 10/27/19 1551  GLUCAP 161* 104* 138*    Assessment/Plan: S/P Procedure(s) (LRB): XI ROBOTIC ASSISTED THORACOSCOPY-SUPERIOR SEGMENTECTOMY (Right) NODE DISSECTION (Right) INTERCOSTAL NERVE BLOCK (Right) -POD # 3 CXR shows small apical pneumo after CT removal Good sats off O2 She has been refusing lovenox - "scared of it" Will see how she does with breakfast, possibly home later today   LOS: 3 days    [name] 10/28/2019

## 2019-11-01 ENCOUNTER — Encounter: Payer: Self-pay | Admitting: Family Medicine

## 2019-11-03 ENCOUNTER — Other Ambulatory Visit: Payer: Self-pay

## 2019-11-03 ENCOUNTER — Encounter (INDEPENDENT_AMBULATORY_CARE_PROVIDER_SITE_OTHER): Payer: Self-pay

## 2019-11-03 DIAGNOSIS — Z4802 Encounter for removal of sutures: Secondary | ICD-10-CM

## 2019-11-08 ENCOUNTER — Other Ambulatory Visit: Payer: Self-pay | Admitting: Thoracic Surgery (Cardiothoracic Vascular Surgery)

## 2019-11-08 DIAGNOSIS — C3491 Malignant neoplasm of unspecified part of right bronchus or lung: Secondary | ICD-10-CM

## 2019-11-09 ENCOUNTER — Other Ambulatory Visit: Payer: Self-pay

## 2019-11-09 ENCOUNTER — Ambulatory Visit
Admission: RE | Admit: 2019-11-09 | Discharge: 2019-11-09 | Disposition: A | Discharge status: Home / Self Care | Payer: Medicare HMO | Source: Ambulatory Visit | Attending: Thoracic Surgery (Cardiothoracic Vascular Surgery) | Admitting: Thoracic Surgery (Cardiothoracic Vascular Surgery)

## 2019-11-09 ENCOUNTER — Ambulatory Visit (INDEPENDENT_AMBULATORY_CARE_PROVIDER_SITE_OTHER): Payer: Self-pay | Admitting: Thoracic Surgery (Cardiothoracic Vascular Surgery)

## 2019-11-09 VITALS — BP 134/70 | HR 60 | Temp 97.6°F | Resp 20 | Ht 65.0 in | Wt 136.0 lb

## 2019-11-09 DIAGNOSIS — C3491 Malignant neoplasm of unspecified part of right bronchus or lung: Secondary | ICD-10-CM

## 2019-11-09 DIAGNOSIS — Z09 Encounter for follow-up examination after completed treatment for conditions other than malignant neoplasm: Secondary | ICD-10-CM

## 2019-11-09 DIAGNOSIS — R05 Cough: Secondary | ICD-10-CM | POA: Diagnosis not present

## 2019-11-09 MED ORDER — TRAMADOL HCL 50 MG PO TABS
50.0000 mg | ORAL_TABLET | Freq: Three times a day (TID) | ORAL | 0 refills | Status: DC | PRN
Start: 2019-11-09 — End: 2020-05-01

## 2019-11-09 NOTE — Progress Notes (Signed)
[address]       [place] [address]             [phone]       HPI: Teresa Booker returns for scheduled postoperative follow-up  Teresa Booker is a 74 year old woman with a history of remote tobacco use, fibromyalgia, reflux, hyperlipidemia, irritable bowel syndrome, and anxiety. She had a CT for coronary calcium score in February 2021. She was found to have a 13 x 19 groundglass nodule in the superior segment of the right lower lobe. On follow-up CT had increased in size to 2.3 x 1.8 cm. PET/CT showed mild metabolic activity with an SUV of 2.1.  She underwent a robotic right lower lobe superior segmentectomy on 10/25/2019. Her postoperative course was uncomplicated and she went home on day 3. The tumor turned out to be a T1, N0, stage Ia adenocarcinoma.  Overall she is feeling well. She is taking tramadol about once a day. She does have some soreness and she is also using Tylenol. She has an occasional cough. It was productive initially but now is nonproductive. She walked about three fourths of a mile yesterday.  Past Medical History:  Diagnosis Date  . Alcohol abuse    - abstemious 20 years. Attends AA - strong recovery   . Allergic reaction   . Allergy    seasonal  . Anxiety   . COPD (chronic obstructive pulmonary disease) (HCC)    minor  . Family history of adverse reaction to anesthesia    mother had problems with n/v  . Fibromyalgia    slight  . GERD (gastroesophageal reflux disease)   . Hyperlipemia   . Hypertension   . IBS (irritable bowel syndrome)   . Postnasal drip   . RBBB (right bundle branch block)   . Rotator cuff syndrome    right    Current Outpatient Medications  Medication Sig Dispense Refill  . acetaminophen (TYLENOL) 500 MG tablet Take 2 tablets (1,000 mg total) by mouth every 6 (six) hours. 30 tablet 0  . aspirin EC 81 MG tablet Take 81 mg by mouth daily. Swallow whole.    . Calcium 200 MG TABS Take 200 mg by mouth every evening.      . hydrOXYzine (ATARAX/VISTARIL) 25 MG tablet Take 1 tablet (25 mg total) by mouth every 8 (eight) hours as needed. (Patient taking differently: Take 25 mg by mouth at bedtime. ) 90 tablet 1  . hyoscyamine (LEVBID) 0.375 MG 12 hr tablet Take 1 tablet by mouth twice daily (Patient taking differently: Take 0.375 mg by mouth in the morning and at bedtime. ) 180 tablet 0  . L-Lysine 500 MG TABS Take 500 mg by mouth daily.    Marland Kitchen loratadine (CLARITIN) 10 MG tablet Take 1 tablet (10 mg total) by mouth daily. 90 tablet 3  . Magnesium 250 MG TABS Take 250 mg by mouth every evening.     . metoprolol succinate (TOPROL-XL) 50 MG 24 hr tablet TAKE 1 TABLET BY MOUTH ONCE DAILY WITH A MEAL OR  IMMEDIATELY  FOLLOWING  A  MEAL (Patient taking differently: Take 50 mg by mouth daily. ) 90 tablet 3  . Multiple Vitamin (MULTIVITAMIN WITH MINERALS) TABS tablet Take 1 tablet by mouth daily.    Marland Kitchen omeprazole (PRILOSEC) 20 MG capsule Take 20 mg by mouth daily before breakfast.     . rosuvastatin (CRESTOR) 10 MG tablet Take 1 tablet (10 mg total) by mouth at  bedtime. 90 tablet 3  . traMADol (ULTRAM) 50 MG tablet Take 1 tablet (50 mg total) by mouth every 8 (eight) hours as needed (mild pain). 15 tablet 0   No current facility-administered medications for this visit.    Physical Exam BP 134/70   Pulse 60   Temp 97.6 F (36.4 C) (Skin)   Resp 20   Ht 5\' 5"  (1.651 m)   Wt 136 lb (61.7 kg)   SpO2 97% Comment: RA  BMI 22.41 kg/m  74 year old woman in no acute distress Alert and oriented x3 with no focal deficits Lungs clear with equal breath sounds bilaterally Incisions clean dry and intact Cardiac regular rate and rhythm No peripheral edema  Diagnostic Tests: CHEST - 2 VIEW  COMPARISON:  October 28, 2019.  FINDINGS: The heart size and mediastinal contours are within normal limits. No pneumothorax or pleural effusion is noted. Left lung is clear. Stable right midlung opacity is noted concerning for  atelectasis or scarring. The visualized skeletal structures are unremarkable.  IMPRESSION: Stable right midlung atelectasis or scarring. No acute cardiopulmonary abnormality seen.   Electronically Signed   By: Marijo Conception M.D.   On: 11/09/2019 14:31 I personally reviewed the chest x-ray images and concur with the findings noted above  Impression: Teresa Booker is a 74 year old former smoker who was found to have a lung nodule on a CT that was originally done to look for coronary calcification.  On follow-up CT that nodule increased in size and on PET there was some mild activity.  She underwent a robotic right lower lobe superior segmentectomy and node dissection.  The nodule was a stage Ia adenocarcinoma.  Stage Ia adenocarcinoma-status post segmentectomy.  Should not need any adjuvant therapy.  We will arrange for a consultation with Dr. Julien Nordmann as an outpatient.  Post segmentectomy-she is doing extremely well.  She still using tramadol about once a day.  She is running low on those so I am going to give her a refill on that.  She is also using Tylenol.  I recommended that she not drive for about another week and then she can start at that time on a limited basis.  Appropriate precautions were discussed.  No other restrictions on her activities, but she was cautioned to build into new activities gradually.  Plan: Referral to Dr. Julien Nordmann for new stage Ia adenocarcinoma Return in 1 month with PA lateral chest x-ray to check on progress  [name], MD [facility] [phone]

## 2019-11-10 ENCOUNTER — Other Ambulatory Visit: Payer: Self-pay | Admitting: *Deleted

## 2019-11-10 NOTE — Progress Notes (Signed)
The proposed treatment discussed in cancer conference 11/04/19 is for discussion purpose only and is not a binding recommendation.  The patient was not physically examined nor present for their treatment options.  Therefore, final treatment plans cannot be decided.

## 2019-11-11 ENCOUNTER — Telehealth: Payer: Self-pay | Admitting: *Deleted

## 2019-11-11 DIAGNOSIS — C3491 Malignant neoplasm of unspecified part of right bronchus or lung: Secondary | ICD-10-CM

## 2019-11-11 NOTE — Telephone Encounter (Signed)
I received a referral on [name].  I called and spoke to her.  I scheduled her to see Dr. Julien Nordmann on 10/14 but she would like to be seen on 10/19.  I scheduled her and she verbalized understanding of appt.

## 2019-11-12 ENCOUNTER — Encounter: Payer: Self-pay | Admitting: Thoracic Surgery (Cardiothoracic Vascular Surgery)

## 2019-11-16 DIAGNOSIS — R69 Illness, unspecified: Secondary | ICD-10-CM | POA: Diagnosis not present

## 2019-11-18 ENCOUNTER — Encounter: Payer: Self-pay | Admitting: *Deleted

## 2019-11-18 NOTE — Progress Notes (Signed)
I updated scheduling to call Teresa Booker and change her appt to 12/07/19.

## 2019-11-19 ENCOUNTER — Telehealth: Payer: Self-pay | Admitting: Physician Assistant

## 2019-11-19 NOTE — Telephone Encounter (Signed)
Teresa Booker has been rescheduled to see [name] on 10/26 at 130pm w/labs at 1pm.

## 2019-11-25 ENCOUNTER — Encounter: Payer: Self-pay | Admitting: Thoracic Surgery (Cardiothoracic Vascular Surgery)

## 2019-11-30 ENCOUNTER — Other Ambulatory Visit: Payer: Medicare HMO

## 2019-11-30 ENCOUNTER — Ambulatory Visit: Payer: Medicare HMO | Admitting: Physician Assistant

## 2019-12-03 DIAGNOSIS — R69 Illness, unspecified: Secondary | ICD-10-CM | POA: Diagnosis not present

## 2019-12-04 NOTE — Progress Notes (Signed)
[place] [phone]   [phone]  CONSULT NOTE  REFERRING PHYSICIAN: Dr. Roxan Hockey  REASON FOR CONSULTATION:  Stage I Non-Small Cell Lung Cancer, Adenocarcinoma  HPI Teresa Booker is a 74 y.o. female with a past medical history significant for anxiety, reflux, IBS, remote alcohol use, anxiety, and hyperlipidemia is referred to the clinic for evaluation of stage I non-small cell lung cancer, adenocarcinoma.  The patient's evaluation began in February 2021 when the patient had a CT for coronary calcium score scan performed.  This imaging study noted a 13 x 19 mm groundglass nodularity in the superior segment of the right lower lobe.  The patient subsequently had a follow-up imaging on 07/07/2019 to further characterize this lesion and measured it approximately 2.3 cm and they noted that it appeared unchanged with slight tenting of the adjacent right major fissure.  Additionally, there were other tiny groundglass pulmonary nodules in the lung bilaterally, the largest measuring 0.5 cm.  The patient had repeat imaging studies in August 2021.  A PET scan was performed on 10/08/2019 which noted mild FDG uptake in the right lower lobe lesion with an SUV of 2.1.  The patient had a consultation with cardiothoracic thoracic surgeon Dr. Roxan Hockey, on 10/11/2019.  After further discussion, the patient opted to proceed with a robotic assisted thoracoscopy and superior segmentectomy.  This was performed on 10/25/2019.  The final pathology [id number]) was consistent with invasive acinar adenocarcinoma measuring 2 cm.  No visceropleural invasion was identified.  The margins were not involved.  There were 14 negative lymph nodes.  The staging was consistent with pT1b, pN0.  Overall, the patient is feeling well today.  She denies any fever, chills, night sweats, or weight loss.  She denies any shortness of breath, cough, or hemoptysis. She takes tylenol for her post-op  pain. She denies any headache or visual changes.  She denies any nausea, vomiting, diarrhea, or constipation.  The patient's family history consists of a brother who had renal cell carcinoma.  The patient's other brother had metastatic non-small cell lung cancer. The patient's father had a stroke.  The patient's father had a myocardial infarction as well as a brain aneurysm.  The patient retired but she worked at [employer] for several years as well as at [employer].  She lives with her partner Teresa Booker.  She does not have any children.  The patient is a recovered alcoholic and is 31 years sober.  The patient denies any drug use.  The patient smoked approximately 29 years averaging 1 pack of cigarettes per day.   HPI  Past Medical History:  Diagnosis Date  . Alcohol abuse    - abstemious 20 years. Attends AA - strong recovery   . Allergic reaction   . Allergy    seasonal  . Anxiety   . COPD (chronic obstructive pulmonary disease) (HCC)    minor  . Family history of adverse reaction to anesthesia    mother had problems with n/v  . Fibromyalgia    slight  . GERD (gastroesophageal reflux disease)   . Hyperlipemia   . Hypertension   . IBS (irritable bowel syndrome)   . Postnasal drip   . RBBB (right bundle branch block)   . Rotator cuff syndrome    right    Past Surgical History:  Procedure Laterality Date  . COLONOSCOPY    . INTERCOSTAL NERVE BLOCK Right 10/25/2019   Procedure: INTERCOSTAL NERVE BLOCK;  Surgeon: [name],  MD;  Location: [facility];  Service: Thoracic;  Laterality: Right;  . MOUTH SURGERY  2002  . NODE DISSECTION Right 10/25/2019   Procedure: NODE DISSECTION;  Surgeon: [name], MD;  Location: [facility];  Service: Thoracic;  Laterality: Right;  . ROTATOR CUFF REPAIR  10-24-05   had bone spur and laceration off cuff. [place]  . XI ROBOTIC ASSISTED THORACOSCOPY- SEGMENTECTOMY Right 10/25/2019   Procedure: XI ROBOTIC ASSISTED THORACOSCOPY-SUPERIOR  SEGMENTECTOMY;  Surgeon: [name], MD;  Location: [facility] OR;  Service: Thoracic;  Laterality: Right;    Family History  Problem Relation Age of Onset  . Hyperlipidemia Mother   . Irritable bowel syndrome Mother   . Coronary artery disease Mother   . Heart attack Mother   . Aneurysm Mother        brain  . Diabetes Brother   . Cancer Brother        small cell lung ca with mets/ smoker  . Stroke Other   . Aneurysm Other   . Lung cancer Other   . Colon cancer Neg Hx     Social History Social History   Tobacco Use  . Smoking status: Former Smoker    Packs/day: 1.00    Years: 25.00    Pack years: 25.00    Types: Cigarettes    Quit date: 03/12/1996    Years since quitting: 23.7  . Smokeless tobacco: Never Used  . Tobacco comment: quit appox 10 yrs ago  Vaping Use  . Vaping Use: Never used  Substance Use Topics  . Alcohol use: No    Alcohol/week: 0.0 standard drinks    Comment: former [employer] AA 20+yrs ago strong recovery  . Drug use: No    Allergies  Allergen Reactions  . Codeine     Makes heart race  . Penicillins     unknown  . Sulfonamide Derivatives     Has a rash with the use of silvadiene for burn  . Aspirin     Upset stomach, High dose only    Current Outpatient Medications  Medication Sig Dispense Refill  . acetaminophen (TYLENOL) 500 MG tablet Take 2 tablets (1,000 mg total) by mouth every 6 (six) hours. 30 tablet 0  . aspirin EC 81 MG tablet Take 81 mg by mouth daily. Swallow whole.    . Calcium 200 MG TABS Take 200 mg by mouth every evening.     . hydrOXYzine (ATARAX/VISTARIL) 25 MG tablet Take 1 tablet (25 mg total) by mouth every 8 (eight) hours as needed. (Patient taking differently: Take 25 mg by mouth at bedtime. ) 90 tablet 1  . hyoscyamine (LEVBID) 0.375 MG 12 hr tablet Take 1 tablet by mouth twice daily (Patient taking differently: Take 0.375 mg by mouth in the morning and at bedtime. ) 180 tablet 0  . L-Lysine 500 MG TABS Take 500 mg by  mouth daily.    Marland Kitchen loratadine (CLARITIN) 10 MG tablet Take 1 tablet (10 mg total) by mouth daily. 90 tablet 3  . Magnesium 250 MG TABS Take 250 mg by mouth every evening.     . metoprolol succinate (TOPROL-XL) 50 MG 24 hr tablet TAKE 1 TABLET BY MOUTH ONCE DAILY WITH A MEAL OR  IMMEDIATELY  FOLLOWING  A  MEAL (Patient taking differently: Take 50 mg by mouth daily. ) 90 tablet 3  . Multiple Vitamin (MULTIVITAMIN WITH MINERALS) TABS tablet Take 1 tablet by mouth daily.    Marland Kitchen omeprazole (PRILOSEC) 20  MG capsule Take 20 mg by mouth daily before breakfast.     . rosuvastatin (CRESTOR) 10 MG tablet Take 1 tablet (10 mg total) by mouth at bedtime. 90 tablet 3  . traMADol (ULTRAM) 50 MG tablet Take 1 tablet (50 mg total) by mouth every 8 (eight) hours as needed (mild pain). 15 tablet 0   No current facility-administered medications for this visit.    REVIEW OF SYSTEMS:   Review of Systems  Constitutional: Negative for appetite change, chills, fatigue, fever and unexpected weight change.  HENT: Negative for mouth sores, nosebleeds, sore throat and trouble swallowing.   Eyes: Negative for eye problems and icterus.  Respiratory: Negative for cough, hemoptysis, shortness of breath and wheezing.   Cardiovascular: Positive for mild post surgical pain. Negative for leg swelling.  Gastrointestinal: Negative for abdominal pain, constipation, diarrhea, nausea and vomiting.  Genitourinary: Negative for bladder incontinence, difficulty urinating, dysuria, frequency and hematuria.   Musculoskeletal: Negative for back pain, gait problem, neck pain and neck stiffness.  Skin: Negative for itching and rash.  Neurological: Negative for dizziness, extremity weakness, gait problem, headaches, light-headedness and seizures.  Hematological: Negative for adenopathy. Does not bruise/bleed easily.  Psychiatric/Behavioral: Negative for confusion, depression and sleep disturbance. The patient is not nervous/anxious.      PHYSICAL EXAMINATION:  Blood pressure 135/65, pulse 71, temperature 97.9 F (36.6 C), resp. rate 12, height 5\' 5"  (1.651 m), weight 138 lb 9.6 oz (62.9 kg), SpO2 99 %.  ECOG PERFORMANCE STATUS: 0  Physical Exam  Constitutional: Oriented to person, place, and time and well-developed, well-nourished, and in no distress.   HENT:  Head: Normocephalic and atraumatic.  Mouth/Throat: Oropharynx is clear and moist. No oropharyngeal exudate.  Eyes: Conjunctivae are normal. Right eye exhibits no discharge. Left eye exhibits no discharge. No scleral icterus.  Neck: Normal range of motion. Neck supple.  Cardiovascular: Normal rate, regular rhythm, normal heart sounds and intact distal pulses.   Pulmonary/Chest: Effort normal and breath sounds normal. No respiratory distress. No wheezes. No rales.  Abdominal: Soft. Bowel sounds are normal. Exhibits no distension and no mass. There is no tenderness.  Musculoskeletal: Normal range of motion. Exhibits no edema.  Lymphadenopathy:    No cervical adenopathy.  Neurological: Alert and oriented to person, place, and time. Exhibits normal muscle tone. Gait normal. Coordination normal.  Skin: Skin is warm and dry. No rash noted. Not diaphoretic. No erythema. No pallor.  Psychiatric: Mood, memory and judgment normal.  Vitals reviewed.  LABORATORY DATA: Lab Results  Component Value Date   WBC 6.7 12/07/2019   HGB 11.7 (L) 12/07/2019   HCT 35.4 (L) 12/07/2019   MCV 92.7 12/07/2019   PLT 252 12/07/2019      Chemistry      Component Value Date/Time   NA 137 12/07/2019 1246   NA 143 08/25/2018 1043   K 4.2 12/07/2019 1246   CL 101 12/07/2019 1246   CO2 28 12/07/2019 1246   BUN 19 12/07/2019 1246   BUN 17 08/25/2018 1043   CREATININE 0.94 12/07/2019 1246      Component Value Date/Time   CALCIUM 9.6 12/07/2019 1246   ALKPHOS 67 12/07/2019 1246   AST 24 12/07/2019 1246   ALT 21 12/07/2019 1246   BILITOT 0.3 12/07/2019 1246        RADIOGRAPHIC STUDIES: DG Chest 2 View  Result Date: 12/07/2019 CLINICAL DATA:  History of right-sided lung carcinoma EXAM: CHEST - 2 VIEW COMPARISON:  November 09, 2019 chest radiograph;  PET-CT October 08, 2019 FINDINGS: There is apparent scarring in the posterior right mid lung region. Lungs elsewhere are clear. Heart size and pulmonary vascularity are normal. No adenopathy. No bone lesions. IMPRESSION: Scarring posterior right mid lung region. Lungs elsewhere clear by radiography. Cardiac silhouette normal. No adenopathy appreciable by radiography. Electronically Signed   By: Lowella Grip III M.D.   On: 12/07/2019 10:00   DG Chest 2 View  Result Date: 11/09/2019 CLINICAL DATA:  History of lung cancer.  Cough. EXAM: CHEST - 2 VIEW COMPARISON:  October 28, 2019. FINDINGS: The heart size and mediastinal contours are within normal limits. No pneumothorax or pleural effusion is noted. Left lung is clear. Stable right midlung opacity is noted concerning for atelectasis or scarring. The visualized skeletal structures are unremarkable. IMPRESSION: Stable right midlung atelectasis or scarring. No acute cardiopulmonary abnormality seen. Electronically Signed   By: Marijo Conception M.D.   On: 11/09/2019 14:31    ASSESSMENT: This is a very pleasant 74 year old Caucasian female diagnosed with stage Ia (T1b, N0, M0) non-small cell lung cancer, adenocarcinoma.  She presented with a right lower lobe pulmonary nodule.  She was diagnosed in September 2021.   PLAN: The patient was seen with Dr. Julien Nordmann today.  Dr. Julien Nordmann had a lengthy discussion with the patient about her current condition and recommended follow-up.  Dr. Julien Nordmann recommended the patient continue on observation with a repeat CT scan of the chest in 6 months.  We will see the patient back for follow-up visit in 6 months for evaluation to review her scan results.  The patient voices understanding of current disease status and treatment  options and is in agreement with the current care plan.  All questions were answered. The patient knows to call the clinic with any problems, questions or concerns. We can certainly see the patient much sooner if necessary.  Thank you so much for allowing me to participate in the care of Teresa Booker. I will continue to follow up the patient with you and assist in her care.  The total time spent in the appointment was 60 minutes.  Disclaimer: This note was dictated with voice recognition software. Similar sounding words can inadvertently be transcribed and may not be corrected upon review.   Chrishana Spargur L Doralee Kocak December 07, 2019, 1:54 PM  ADDENDUM: Hematology/Oncology Attending: Had a face-to-face encounter with the patient today.  I recommended her care plan.  This is a very pleasant 74 years old white female who was seen by her cardiologist for routine evaluation and she underwent CT cardiac scoring in February 2021 and it showed a focus of airspace disease versus groundglass nodularity in the superior segment of the right lower lobe partially imaged and measuring 1.9 x 1.3 cm.  Was followed by CT scan of the chest on 07/07/2019 and it showed subsolid 2.3 cm pulmonary nodule in the superior segment of the right lower lobe with slight tenting of the adjacent right major fissure and primary bronchogenic adenocarcinoma was not excluded.  There was also additionally tiny groundglass pulmonary nodules in the lungs bilaterally the largest measures 0.5 cm.  No evidence of thoracic adenopathy or other finding of metastatic disease in the chest.  A PET scan on October 08, 2019 showed mild increased FDG uptake less than mediastinal blood pool within a subsolid lesion in the superior segment of the right lower lobe.  This remains suspicious for bronchogenic neoplasm based on the morphologic characteristics.  No FDG avid nodal disease or  signs of metastatic disease.  The patient was referred to Dr.  Roxan Hockey and on 10/25/2019 she underwent robotic assisted right thoracoscopy with right lower lobe superior segmentectomy and lymph node dissection. Final pathology [id number]) was consistent with invasive acinar adenocarcinoma measuring 2.0 cm with no visceral pleural invasion and negative resection margin.  The dissected lymph nodes were negative for malignancy. Dr. Roxan Hockey kindly referred the patient to [place] today for evaluation and consideration of follow-up for her lung cancer. When seen today she is feeling fine and recovering well from her surgery. I had a lengthy discussion with the patient and her partner today about her current condition and treatment options. The patient has a stage Ia (T1b, N0, M0) non-small cell lung cancer, adenocarcinoma status post right lower lobe superior segmentectomy with lymph node dissection on October 25, 2019. I explained to the patient that the median 5-year survival for patient with a stage I is around 80%. I also explained to the patient that there is no benefit for adjuvant systemic chemotherapy or radiation for a stage IA non-small cell lung cancer. The standard of care is to continue observation and close monitoring.  I recommended for the patient to have repeat CT scan of the chest in 6 months for restaging of her disease. She will come back for follow-up visit at that time. She was advised to call immediately if she has any concerning symptoms in the interval.  Disclaimer: This note was dictated with voice recognition software. Similar sounding words can inadvertently be transcribed and may be missed upon review. Eilleen Kempf, MD 12/07/19

## 2019-12-06 ENCOUNTER — Other Ambulatory Visit: Payer: Self-pay | Admitting: Thoracic Surgery (Cardiothoracic Vascular Surgery)

## 2019-12-06 DIAGNOSIS — C3491 Malignant neoplasm of unspecified part of right bronchus or lung: Secondary | ICD-10-CM

## 2019-12-07 ENCOUNTER — Inpatient Hospital Stay: Payer: Medicare HMO

## 2019-12-07 ENCOUNTER — Inpatient Hospital Stay: Payer: Medicare HMO | Attending: Physician Assistant | Admitting: Physician Assistant

## 2019-12-07 ENCOUNTER — Ambulatory Visit (INDEPENDENT_AMBULATORY_CARE_PROVIDER_SITE_OTHER): Payer: Self-pay | Admitting: Thoracic Surgery (Cardiothoracic Vascular Surgery)

## 2019-12-07 ENCOUNTER — Other Ambulatory Visit: Payer: Self-pay

## 2019-12-07 ENCOUNTER — Ambulatory Visit
Admission: RE | Admit: 2019-12-07 | Discharge: 2019-12-07 | Disposition: A | Discharge status: Home / Self Care | Payer: Medicare HMO | Source: Ambulatory Visit | Attending: Thoracic Surgery (Cardiothoracic Vascular Surgery) | Admitting: Thoracic Surgery (Cardiothoracic Vascular Surgery)

## 2019-12-07 ENCOUNTER — Encounter: Payer: Self-pay | Admitting: Thoracic Surgery (Cardiothoracic Vascular Surgery)

## 2019-12-07 ENCOUNTER — Encounter: Payer: Self-pay | Admitting: Physician Assistant

## 2019-12-07 VITALS — BP 135/65 | HR 71 | Temp 97.9°F | Resp 12 | Ht 65.0 in | Wt 138.6 lb

## 2019-12-07 VITALS — BP 138/56 | HR 77 | Temp 97.0°F | Resp 20 | Ht 65.0 in | Wt 137.0 lb

## 2019-12-07 DIAGNOSIS — C3491 Malignant neoplasm of unspecified part of right bronchus or lung: Secondary | ICD-10-CM

## 2019-12-07 DIAGNOSIS — C3431 Malignant neoplasm of lower lobe, right bronchus or lung: Secondary | ICD-10-CM | POA: Insufficient documentation

## 2019-12-07 DIAGNOSIS — J984 Other disorders of lung: Secondary | ICD-10-CM | POA: Diagnosis not present

## 2019-12-07 DIAGNOSIS — Z9889 Other specified postprocedural states: Secondary | ICD-10-CM

## 2019-12-07 LAB — CMP (CANCER CENTER ONLY)
ALT: 21 U/L (ref 0–44)
AST: 24 U/L (ref 15–41)
Albumin: 4 g/dL (ref 3.5–5.0)
Alkaline Phosphatase: 67 U/L (ref 38–126)
Anion gap: 8 (ref 5–15)
BUN: 19 mg/dL (ref 8–23)
CO2: 28 mmol/L (ref 22–32)
Calcium: 9.6 mg/dL (ref 8.9–10.3)
Chloride: 101 mmol/L (ref 98–111)
Creatinine: 0.94 mg/dL (ref 0.44–1.00)
GFR, Estimated: >60 mL/min (ref >60)
Glucose, Bld: 114 mg/dL — ABNORMAL HIGH (ref 70–99)
Potassium: 4.2 mmol/L (ref 3.5–5.1)
Sodium: 137 mmol/L (ref 135–145)
Total Bilirubin: 0.3 mg/dL (ref 0.3–1.2)
Total Protein: 7.1 g/dL (ref 6.5–8.1)

## 2019-12-07 LAB — CBC WITH DIFFERENTIAL (CANCER CENTER ONLY)
Abs Immature Granulocytes: 0.04 10*3/uL (ref 0.00–0.07)
Basophils Absolute: 0.1 10*3/uL (ref 0.0–0.1)
Basophils Relative: 1 %
Eosinophils Absolute: 0.2 10*3/uL (ref 0.0–0.5)
Eosinophils Relative: 3 %
HCT: 35.4 % — ABNORMAL LOW (ref 36.0–46.0)
Hemoglobin: 11.7 g/dL — ABNORMAL LOW (ref 12.0–15.0)
Immature Granulocytes: 1 %
Lymphocytes Relative: 43 %
Lymphs Abs: 2.9 10*3/uL (ref 0.7–4.0)
MCH: 30.6 pg (ref 26.0–34.0)
MCHC: 33.1 g/dL (ref 30.0–36.0)
MCV: 92.7 fL (ref 80.0–100.0)
Monocytes Absolute: 0.6 10*3/uL (ref 0.1–1.0)
Monocytes Relative: 9 %
Neutro Abs: 3 10*3/uL (ref 1.7–7.7)
Neutrophils Relative %: 43 %
Platelet Count: 252 10*3/uL (ref 150–400)
RBC: 3.82 MIL/uL — ABNORMAL LOW (ref 3.87–5.11)
RDW: 12.4 % (ref 11.5–15.5)
WBC Count: 6.7 10*3/uL (ref 4.0–10.5)
nRBC: 0 % (ref 0.0–0.2)

## 2019-12-07 NOTE — Progress Notes (Signed)
[address]       [place] [address]             [phone]     HPI: Teresa Booker returns for a scheduled follow-up visit  Teresa Booker is a 74 year old woman with a history of remote tobacco abuse, fibromyalgia, reflux, hyperlipidemia, irritable bowel syndrome, and anxiety.  She was found to have a lung nodule on a CT that was done for coronary calcium score in February 2021.  It was a 13 x 19 mm groundglass nodule in the superior segment of the right lower lobe.  Follow-up CT showed an increase in size to 2.3 x 1.8 cm.  PET/CT showed mild metabolic activity with SUV of 2.1.  I did a robotic right lower lobe superior segmentectomy on 10/25/2019.  Postoperative course was uncomplicated.  The tumor turned out to be a T1, N0, stage Ia adenocarcinoma.  I saw her in the office on 11/09/2019.  She was doing well at that time.  She continues to do well.  She is walking about a mile a day.  She does have some incisional discomfort.  She is taking an Aleve in the morning for that.  She is not taking any narcotics.  She is seeing Dr. Julien Nordmann later this afternoon.  Past Medical History:  Diagnosis Date  . Alcohol abuse    - abstemious 20 years. Attends AA - strong recovery   . Allergic reaction   . Allergy    seasonal  . Anxiety   . COPD (chronic obstructive pulmonary disease) (HCC)    minor  . Family history of adverse reaction to anesthesia    mother had problems with n/v  . Fibromyalgia    slight  . GERD (gastroesophageal reflux disease)   . Hyperlipemia   . Hypertension   . IBS (irritable bowel syndrome)   . Postnasal drip   . RBBB (right bundle branch block)   . Rotator cuff syndrome    right    Current Outpatient Medications  Medication Sig Dispense Refill  . acetaminophen (TYLENOL) 500 MG tablet Take 2 tablets (1,000 mg total) by mouth every 6 (six) hours. 30 tablet 0  . aspirin EC 81 MG tablet Take 81 mg by mouth daily. Swallow whole.    . Calcium 200 MG  TABS Take 200 mg by mouth every evening.     . hydrOXYzine (ATARAX/VISTARIL) 25 MG tablet Take 1 tablet (25 mg total) by mouth every 8 (eight) hours as needed. (Patient taking differently: Take 25 mg by mouth at bedtime. ) 90 tablet 1  . hyoscyamine (LEVBID) 0.375 MG 12 hr tablet Take 1 tablet by mouth twice daily (Patient taking differently: Take 0.375 mg by mouth in the morning and at bedtime. ) 180 tablet 0  . L-Lysine 500 MG TABS Take 500 mg by mouth daily.    Marland Kitchen loratadine (CLARITIN) 10 MG tablet Take 1 tablet (10 mg total) by mouth daily. 90 tablet 3  . Magnesium 250 MG TABS Take 250 mg by mouth every evening.     . metoprolol succinate (TOPROL-XL) 50 MG 24 hr tablet TAKE 1 TABLET BY MOUTH ONCE DAILY WITH A MEAL OR  IMMEDIATELY  FOLLOWING  A  MEAL (Patient taking differently: Take 50 mg by mouth daily. ) 90 tablet 3  . Multiple Vitamin (MULTIVITAMIN WITH MINERALS) TABS tablet Take 1 tablet by mouth daily.    Marland Kitchen omeprazole (PRILOSEC) 20 MG capsule Take 20 mg by  mouth daily before breakfast.     . rosuvastatin (CRESTOR) 10 MG tablet Take 1 tablet (10 mg total) by mouth at bedtime. 90 tablet 3  . traMADol (ULTRAM) 50 MG tablet Take 1 tablet (50 mg total) by mouth every 8 (eight) hours as needed (mild pain). 15 tablet 0   No current facility-administered medications for this visit.    Physical Exam BP (!) 138/56 (BP Location: Right Arm, Patient Position: Sitting)   Pulse 77   Temp (!) 97 F (36.1 C)   Resp 20   Ht 5\' 5"  (1.651 m)   Wt 137 lb (62.1 kg)   SpO2 97% Comment: RA with mask on  BMI 22.70 kg/m  73 year old woman in no acute distress Alert and oriented x3 with no focal deficits Lungs clear with equal breath sounds bilaterally Incisions healing well No peripheral edema  Diagnostic Tests: CHEST - 2 VIEW  COMPARISON:  November 09, 2019 chest radiograph; PET-CT October 08, 2019  FINDINGS: There is apparent scarring in the posterior right mid lung region. Lungs elsewhere  are clear. Heart size and pulmonary vascularity are normal. No adenopathy. No bone lesions.  IMPRESSION: Scarring posterior right mid lung region. Lungs elsewhere clear by radiography. Cardiac silhouette normal. No adenopathy appreciable by radiography.   Electronically Signed   By: Lowella Grip III M.D.   On: 12/07/2019 10:00  I personally reviewed the chest x-ray images and concur with the findings noted above  Impression: Teresa Booker is a 74 year old woman with a remote history of tobacco abuse who was found to have a lung nodule on a CT done for coronary calcium scoring.  That nodule increased in size on a follow-up scan and was mildly hypermetabolic on PET.  She underwent a robotic assisted right lower lobe superior segmentectomy and node dissection on 10/25/2019.  She now is about 6 weeks out from surgery.  Overall she is doing extremely well.  Her exercise tolerance is good.  She does have some discomfort but is able to control that with nonsteroidals.  She sees Dr. Julien Nordmann this afternoon.  There are no restrictions on her activities from my standpoint.  She should let me know if she has any worsening pain.  Plan: Follow-up as scheduled with Dr. Julien Nordmann I will plan to see her back in 6 months to check on her progress  [name], MD [facility] [phone]

## 2019-12-20 ENCOUNTER — Other Ambulatory Visit: Payer: Self-pay | Admitting: Family Medicine

## 2019-12-23 ENCOUNTER — Telehealth: Payer: Self-pay

## 2019-12-23 NOTE — Telephone Encounter (Signed)
Received approval via fax from [facility] for medication HYDROXYZINE  Covered thur 02/12/2019-02/11/2020 copy of this is this in pt chart.

## 2019-12-31 NOTE — Progress Notes (Signed)
Cardiology Office Note    Date:  01/12/2020   ID:  Teresa Booker, DOB 25-Jun-1945, MRN [id number]  PCP:  Teresa Post, MD  Cardiologist: Teresa Rouge, MD EPS: None   History of Present Illness:  Teresa Booker is a 74 y.o. female , partner of Teresa Booker, CMA in our office, with history of hyperlipidemia on statin, palpitations with Holter 04/18/2017 showing PVCs treated with Toprol, normal NST 05/06/2017 LVEF 70% echo 04/2017 LVEF 60 to 65% with anterior leaflet prolapse no significant MR  Seen by PA July 2020 with increased palpitations due to prednisone and excess caffeine intake Toprol dose increased   Had COVID vaccine in February  Calcium score 04/05/19 109 which is 53 st percentile for age and sex isolated to LAD On crestor with LDL at goal 67 07/07/19 This showed nodule in superior segment of RLL F/U CT 07/07/19 with 2.3 x 1.8 cm nodule in same area with adjacent tenting of lung tissue Referred to pulmonary and ultimately had surgery with Dr Roxan Hockey  10/25/19 robotic right lower lobe superior segmentectomy T1N0 stage 1A adenocarcinoma  Recovering well no cardiac complaints    Past Medical History:  Diagnosis Date  . Alcohol abuse    - abstemious 20 years. Attends AA - strong recovery   . Allergic reaction   . Allergy    seasonal  . Anxiety   . COPD (chronic obstructive pulmonary disease) (HCC)    minor  . Family history of adverse reaction to anesthesia    mother had problems with n/v  . Fibromyalgia    slight  . GERD (gastroesophageal reflux disease)   . Hyperlipemia   . Hypertension   . IBS (irritable bowel syndrome)   . Postnasal drip   . RBBB (right bundle branch block)   . Rotator cuff syndrome    right    Past Surgical History:  Procedure Laterality Date  . COLONOSCOPY    . INTERCOSTAL NERVE BLOCK Right 10/25/2019   Procedure: INTERCOSTAL NERVE BLOCK;  Surgeon: [name], MD;  Location: [place];  Service: Thoracic;  Laterality: Right;  .  MOUTH SURGERY  2002  . NODE DISSECTION Right 10/25/2019   Procedure: NODE DISSECTION;  Surgeon: [name], MD;  Location: [place];  Service: Thoracic;  Laterality: Right;  . ROTATOR CUFF REPAIR  10-24-05   had bone spur and laceration off cuff. Daldorf  . XI ROBOTIC ASSISTED THORACOSCOPY- SEGMENTECTOMY Right 10/25/2019   Procedure: XI ROBOTIC ASSISTED THORACOSCOPY-SUPERIOR SEGMENTECTOMY;  Surgeon: [name], MD;  Location: [facility];  Service: Thoracic;  Laterality: Right;    Current Medications: Current Meds  Medication Sig  . acetaminophen (TYLENOL) 500 MG tablet Take 2 tablets (1,000 mg total) by mouth every 6 (six) hours.  Marland Kitchen aspirin EC 81 MG tablet Take 81 mg by mouth daily. Swallow whole.  . Calcium 200 MG TABS Take 200 mg by mouth every evening.   . hydrOXYzine (ATARAX/VISTARIL) 25 MG tablet TAKE 1 TABLET BY MOUTH EVERY 8 HOURS AS NEEDED  . hyoscyamine (LEVBID) 0.375 MG 12 hr tablet TAKE ONE TABLET BY MOUTH TWICE A DAY  . L-Lysine 500 MG TABS Take 500 mg by mouth daily.  Marland Kitchen loratadine (CLARITIN) 10 MG tablet Take 1 tablet (10 mg total) by mouth daily.  . Magnesium 250 MG TABS Take 250 mg by mouth every evening.   . metoprolol succinate (TOPROL-XL) 50 MG 24 hr tablet TAKE 1 TABLET BY MOUTH ONCE DAILY WITH A  MEAL OR  IMMEDIATELY  FOLLOWING  A  MEAL (Patient taking differently: Take 50 mg by mouth daily. )  . Multiple Vitamin (MULTIVITAMIN WITH MINERALS) TABS tablet Take 1 tablet by mouth daily.  Marland Kitchen omeprazole (PRILOSEC) 20 MG capsule Take 20 mg by mouth daily before breakfast.   . rosuvastatin (CRESTOR) 10 MG tablet Take 1 tablet (10 mg total) by mouth at bedtime.  . traMADol (ULTRAM) 50 MG tablet Take 1 tablet (50 mg total) by mouth every 8 (eight) hours as needed (mild pain).     Allergies:   Codeine, Penicillins, Sulfonamide derivatives, and Aspirin   Social History   Socioeconomic History  . Marital status: Single    Spouse name: Not on file  . Number of  children: 0  . Years of education: 16  . Highest education level: Bachelor's degree (e.g., BA, AB, BS)  Occupational History  . Occupation: Freight forwarder    Comment: retired  Tobacco Use  . Smoking status: Former Smoker    Packs/day: 1.00    Years: 25.00    Pack years: 25.00    Types: Cigarettes    Quit date: 03/12/1996    Years since quitting: 23.8  . Smokeless tobacco: Never Used  . Tobacco comment: quit appox 10 yrs ago  Vaping Use  . Vaping Use: Never used  Substance and Sexual Activity  . Alcohol use: No    Alcohol/week: 0.0 standard drinks    Comment: former [employer]uer AA 20+yrs ago strong recovery  . Drug use: No  . Sexual activity: Yes    Partners: Female  Other Topics Concern  . Not on file  Social History Narrative   03/30/19:   [employer]   Long-term relationship - '95   1 cat   Social Determinants of Health   Financial Resource Strain: Low Risk   . Difficulty of Paying Living Expenses: Not hard at all  Food Insecurity: No Food Insecurity  . Worried About Charity fundraiser in the Last Year: Never true  . Ran Out of Food in the Last Year: Never true  Transportation Needs: No Transportation Needs  . Lack of Transportation (Medical): No  . Lack of Transportation (Non-Medical): No  Physical Activity: Sufficiently Active  . Days of Exercise per Week: 3 days  . Minutes of Exercise per Session: 60 min  Stress: Stress Concern Present  . Feeling of Stress : To some extent  Social Connections: Unknown  . Frequency of Communication with Friends and Family: More than three times a week  . Frequency of Social Gatherings with Friends and Family: Once a week  . Attends Religious Services: Never  . Active Member of Clubs or Organizations: Yes  . Attends Archivist Meetings: More than 4 times per year  . Marital Status: Not on file     Family History:  The patient's   family history includes Aneurysm in her mother and another family member; Cancer in  her brother; Coronary artery disease in her mother; Diabetes in her brother; Heart attack in her mother; Hyperlipidemia in her mother; Irritable bowel syndrome in her mother; Lung cancer in an other family member; Stroke in an other family member.   ROS:   Please see the history of present illness.    ROS All other systems reviewed and are negative.   PHYSICAL EXAM:   VS:  BP 130/68   Pulse 77   Ht 5\' 5"  (1.651 m)   Wt 63.6 kg  SpO2 96%   BMI 23.33 kg/m    Affect appropriate Healthy:  appears stated age 13: normal Neck supple with no adenopathy JVP normal no bruits no thyromegaly Lungs clear Booker right thoracotomy robotic  Heart:  S1/S2 no murmur, no rub, gallop or click PMI normal Abdomen: benighn, BS positve, no tenderness, no AAA no bruit.  No HSM or HJR Distal pulses intact with no bruits No edema Neuro non-focal Skin warm and dry No muscular weakness  Wt Readings from Last 3 Encounters:  01/12/20 63.6 kg  12/07/19 62.9 kg  12/07/19 62.1 kg      Studies/Labs Reviewed:   EKG:   08/25/18 NSR rate 76 nonspecific ST changes with frequent PVC's  Recent Labs: 12/07/2019: ALT 21; BUN 19; Creatinine 0.94; Hemoglobin 11.7; Platelet Count 252; Potassium 4.2; Sodium 137   Lipid Panel    Component Value Date/Time   CHOL 141 07/07/2019 0752   TRIG 89 07/07/2019 0752   HDL 57 07/07/2019 0752   CHOLHDL 2.5 07/07/2019 0752   CHOLHDL 2.7 10/05/2015 0758   VLDL 28 10/05/2015 0758   LDLCALC 67 07/07/2019 0752    Additional studies/ records that were reviewed today include:  NST 05/06/2017   Nuclear stress EF: 70%.  There was no ST segment deviation noted during stress.  Blood pressure demonstrated a hypertensive response to exercise.  The study is normal.  This is a low risk study.  The left ventricular ejection fraction is hyperdynamic (>65%).   Normal exercise nuclear stress test with no evidence for prior infarct or ischemia.  Normal LVEF. Poor  exercise capacity. Hypertensive response to exercise.   2D echo 3/26/2019Study Conclusions   - Left ventricle: The cavity size was normal. Systolic function was   normal. The estimated ejection fraction was in the range of 60%   to 65%. Wall motion was normal; there were no regional wall   motion abnormalities. Left ventricular diastolic function   parameters were normal. - Mitral valve: Mild, late systolicprolapse, involving the anterior   leaflet. There was no significant regurgitation. - Pulmonary arteries: PA peak pressure: 32 mm Hg (S).   Holter monitor 04/2017 Sinus rhythm PAC;s/ PVC;s No significant arrhythmias       ASSESSMENT:    Palpitations, HLD, Pulmonary Nodule   PLAN:  In order of problems listed above:   Palpitations PVCs and PACs on Holter monitor 04/2017 worse in June 2020 on prednisone for bee sting and excess caffeine Better on higher dose Toprol .Labs Including TSH/Hct/K and Cr normal 08/25/18   Hyperlipidemia on Crestor LDL  67 at goal  Pulmonary: F/U Dr Valeta Harms and Roxan Hockey f/u CXR 12/07/19 some scarring in right middle lobe no Booker segmental lobectomy on right for adenocarcinoma stage 1AN0   F/U with me in a year   Signed, Teresa Rouge, MD  01/12/2020 8:53 AM    [address], [place], [place]  [address] Phone: [phone]; Fax: [phone]

## 2020-01-12 ENCOUNTER — Other Ambulatory Visit: Payer: Self-pay

## 2020-01-12 ENCOUNTER — Ambulatory Visit: Payer: Medicare HMO | Admitting: Cardiovascular Disease

## 2020-01-12 ENCOUNTER — Encounter: Payer: Self-pay | Admitting: Cardiovascular Disease

## 2020-01-12 VITALS — BP 130/68 | HR 77 | Ht 65.0 in | Wt 140.2 lb

## 2020-01-12 DIAGNOSIS — E785 Hyperlipidemia, unspecified: Secondary | ICD-10-CM

## 2020-01-12 NOTE — Patient Instructions (Signed)
Medication Instructions:  *If you need a refill on your cardiac medications before your next appointment, please call your pharmacy*  Lab Work: Your physician recommends that you return for lab work in: anytime in the next week or two for fasting lipid and liver.  If you have labs (blood work) drawn today and your tests are completely normal, you will receive your results only by: Marland Kitchen MyChart Message (if you have MyChart) OR . A paper copy in the mail If you have any lab test that is abnormal or we need to change your treatment, we will call you to review the results.  Follow-Up: At [facility], you and your health needs are our priority.  As part of our continuing mission to provide you with exceptional heart care, we have created designated Provider Care Teams.  These Care Teams include your primary Cardiologist (physician) and Advanced Practice Providers (APPs -  Physician Assistants and Nurse Practitioners) who all work together to provide you with the care you need, when you need it.  We recommend signing up for the patient portal called "MyChart".  Sign up information is provided on this After Visit Summary.  MyChart is used to connect with patients for Virtual Visits (Telemedicine).  Patients are able to view lab/test results, encounter notes, upcoming appointments, etc.  Non-urgent messages can be sent to your provider as well.   To learn more about what you can do with MyChart, go to [email or web address].    Your next appointment:   6 month(s)  The format for your next appointment:   In Person  Provider:   You may see Jenkins Rouge, MD or one of the following Advanced Practice Providers on your designated Care Team:    Truitt Merle, NP  Cecilie Kicks, NP  Kathyrn Drown, NP

## 2020-01-14 ENCOUNTER — Other Ambulatory Visit: Payer: Self-pay

## 2020-01-14 ENCOUNTER — Other Ambulatory Visit: Payer: Medicare HMO | Admitting: *Deleted

## 2020-01-14 DIAGNOSIS — E785 Hyperlipidemia, unspecified: Secondary | ICD-10-CM | POA: Diagnosis not present

## 2020-01-14 LAB — LIPID PANEL
Chol/HDL Ratio: 2.3 ratio (ref 0.0–4.4)
Cholesterol, Total: 145 mg/dL (ref 100–199)
HDL: 64 mg/dL (ref >39)
LDL Chol Calc (NIH): 63 mg/dL (ref 0–99)
Triglycerides: 101 mg/dL (ref 0–149)
VLDL Cholesterol Cal: 18 mg/dL (ref 5–40)

## 2020-01-14 LAB — HEPATIC FUNCTION PANEL
ALT: 22 IU/L (ref 0–32)
AST: 23 IU/L (ref 0–40)
Albumin: 4.3 g/dL (ref 3.7–4.7)
Alkaline Phosphatase: 71 IU/L (ref 44–121)
Bilirubin Total: 0.3 mg/dL (ref 0.0–1.2)
Bilirubin, Direct: 0.11 mg/dL (ref 0.00–0.40)
Total Protein: 6.7 g/dL (ref 6.0–8.5)

## 2020-01-19 ENCOUNTER — Other Ambulatory Visit: Payer: Medicare HMO

## 2020-03-06 DIAGNOSIS — J029 Acute pharyngitis, unspecified: Secondary | ICD-10-CM | POA: Diagnosis not present

## 2020-03-19 ENCOUNTER — Other Ambulatory Visit: Payer: Self-pay | Admitting: Family Medicine

## 2020-03-20 ENCOUNTER — Other Ambulatory Visit: Payer: Self-pay | Admitting: Cardiovascular Disease

## 2020-03-20 ENCOUNTER — Other Ambulatory Visit: Payer: Self-pay | Admitting: Family Medicine

## 2020-03-28 ENCOUNTER — Encounter: Payer: Self-pay | Admitting: Gastroenterology

## 2020-03-30 DIAGNOSIS — H524 Presbyopia: Secondary | ICD-10-CM | POA: Diagnosis not present

## 2020-03-30 DIAGNOSIS — Z0100 Encounter for examination of eyes and vision without abnormal findings: Secondary | ICD-10-CM | POA: Diagnosis not present

## 2020-03-30 DIAGNOSIS — H52223 Regular astigmatism, bilateral: Secondary | ICD-10-CM | POA: Diagnosis not present

## 2020-03-30 DIAGNOSIS — H5203 Hypermetropia, bilateral: Secondary | ICD-10-CM | POA: Diagnosis not present

## 2020-04-12 ENCOUNTER — Ambulatory Visit: Payer: Medicare HMO

## 2020-04-12 ENCOUNTER — Other Ambulatory Visit: Payer: Self-pay

## 2020-04-16 ENCOUNTER — Encounter: Payer: Self-pay | Admitting: Internal Medicine

## 2020-04-28 NOTE — Progress Notes (Signed)
Subjective:   Teresa Booker is a 75 y.o. female who presents for Medicare Annual (Subsequent) preventive examination.  Review of Systems    N/A  Cardiac Risk Factors include: advanced age (>69men, >91 women);dyslipidemia     Objective:    Today's Vitals   05/01/20 1500  BP: (!) 110/56  Pulse: 75  Temp: 98.2 F (36.8 C)  TempSrc: Oral  SpO2: 95%  Weight: 139 lb 7 oz (63.2 kg)  Height: 5\' 5"  (1.651 m)   Body mass index is 23.2 kg/m.  Advanced Directives 05/01/2020 10/25/2019 10/15/2019 03/30/2019  Does Patient Have a Medical Advance Directive? Yes Yes Yes Yes  Type of Paramedic of [place];Living will [place];Living will [place];Living will [place];Living will  Does patient want to make changes to medical advance directive? No - Patient declined No - Patient declined No - Patient declined No - Patient declined  Copy of [place] in Chart? Yes - validated most recent copy scanned in chart (See row information) No - copy requested No - copy requested No - copy requested    Current Medications (verified) Outpatient Encounter Medications as of 05/01/2020  Medication Sig  . aspirin EC 81 MG tablet Take 81 mg by mouth daily. Swallow whole.  . Calcium 200 MG TABS Take 200 mg by mouth every evening.   . hydrOXYzine (ATARAX/VISTARIL) 25 MG tablet TAKE 1 TABLET BY MOUTH EVERY 8 HOURS AS NEEDED  . hyoscyamine (LEVBID) 0.375 MG 12 hr tablet TAKE ONE TABLET BY MOUTH TWICE A DAY  . L-Lysine 500 MG TABS Take 500 mg by mouth daily.  Marland Kitchen loratadine (CLARITIN) 10 MG tablet Take 1 tablet (10 mg total) by mouth daily.  . Magnesium 250 MG TABS Take 250 mg by mouth every evening.   . metoprolol succinate (TOPROL-XL) 50 MG 24 hr tablet TAKE 1 TABLET BY MOUTH ONCE DAILY WITH A MEAL OR  IMMEDIATELY  FOLLOWING  A  MEAL (Patient taking differently: Take 50 mg by mouth daily.)  . Multiple Vitamin  (MULTIVITAMIN WITH MINERALS) TABS tablet Take 1 tablet by mouth daily.  Marland Kitchen omeprazole (PRILOSEC) 20 MG capsule Take 20 mg by mouth daily before breakfast.  . rosuvastatin (CRESTOR) 10 MG tablet TAKE 1 TABLET BY MOUTH AT BEDTIME  . [DISCONTINUED] acetaminophen (TYLENOL) 500 MG tablet Take 2 tablets (1,000 mg total) by mouth every 6 (six) hours.  . [DISCONTINUED] traMADol (ULTRAM) 50 MG tablet Take 1 tablet (50 mg total) by mouth every 8 (eight) hours as needed (mild pain).   No facility-administered encounter medications on file as of 05/01/2020.    Allergies (verified) Codeine, Penicillins, Sulfonamide derivatives, and Aspirin   History: Past Medical History:  Diagnosis Date  . Alcohol abuse    - abstemious 20 years. Attends AA - strong recovery   . Allergic reaction   . Allergy    seasonal  . Anxiety   . COPD (chronic obstructive pulmonary disease) (HCC)    minor  . Family history of adverse reaction to anesthesia    mother had problems with n/v  . Fibromyalgia    slight  . GERD (gastroesophageal reflux disease)   . Hyperlipemia   . Hypertension   . IBS (irritable bowel syndrome)   . Postnasal drip   . RBBB (right bundle branch block)   . Rotator cuff syndrome    right   Past Surgical History:  Procedure Laterality Date  . COLONOSCOPY    .  INTERCOSTAL NERVE BLOCK Right 10/25/2019   Procedure: INTERCOSTAL NERVE BLOCK;  Surgeon: [name], MD;  Location: [facility];  Service: Thoracic;  Laterality: Right;  . MOUTH SURGERY  2002  . NODE DISSECTION Right 10/25/2019   Procedure: NODE DISSECTION;  Surgeon: [name], MD;  Location: [facility];  Service: Thoracic;  Laterality: Right;  . ROTATOR CUFF REPAIR  10-24-05   had bone spur and laceration off cuff. [place]  . XI ROBOTIC ASSISTED THORACOSCOPY- SEGMENTECTOMY Right 10/25/2019   Procedure: XI ROBOTIC ASSISTED THORACOSCOPY-SUPERIOR SEGMENTECTOMY;  Surgeon: [name], MD;  Location: [facility] OR;  Service:  Thoracic;  Laterality: Right;   Family History  Problem Relation Age of Onset  . Hyperlipidemia Mother   . Irritable bowel syndrome Mother   . Coronary artery disease Mother   . Heart attack Mother   . Aneurysm Mother        brain  . Diabetes Brother   . Cancer Brother        small cell lung ca with mets/ smoker  . Stroke Other   . Aneurysm Other   . Lung cancer Other   . Colon cancer Neg Hx    Social History   Socioeconomic History  . Marital status: Single    Spouse name: Not on file  . Number of children: 0  . Years of education: 16  . Highest education level: Bachelor's degree (e.g., BA, AB, BS)  Occupational History  . Occupation: Freight forwarder    Comment: retired  Tobacco Use  . Smoking status: Former Smoker    Packs/day: 1.00    Years: 25.00    Pack years: 25.00    Types: Cigarettes    Quit date: 03/12/1996    Years since quitting: 24.1  . Smokeless tobacco: Never Used  . Tobacco comment: quit appox 10 yrs ago  Vaping Use  . Vaping Use: Never used  Substance and Sexual Activity  . Alcohol use: No    Alcohol/week: 0.0 standard drinks    Comment: former usuer AA 20+yrs ago strong recovery  . Drug use: No  . Sexual activity: Yes    Partners: Female  Other Topics Concern  . Not on file  Social History Narrative   03/30/19:   [employer]   Long-term relationship - '95   1 cat   Social Determinants of Health   Financial Resource Strain: Not on file  Food Insecurity: Not on file  Transportation Needs: Not on file  Physical Activity: Not on file  Stress: Not on file  Social Connections: Moderately Integrated  . Frequency of Communication with Friends and Family: More than three times a week  . Frequency of Social Gatherings with Friends and Family: Never  . Attends Religious Services: Never  . Active Member of Clubs or Organizations: Yes  . Attends Archivist Meetings: Never  . Marital Status: Living with partner    Tobacco  Counseling Counseling given: Not Answered Comment: quit appox 10 yrs ago   Clinical Intake:  Pre-visit preparation completed: Yes  Pain : No/denies pain     Nutritional Risks: None Diabetes: No  How often do you need to have someone help you when you read instructions, pamphlets, or other written materials from your doctor or pharmacy?: 1 - Never What is the last grade level you completed in school?: 4 years college  Diabetic?No  Interpreter Needed?: No  Information entered by :: [place] [facility] Daily Living  In your present state of health, do you have any difficulty performing the following activities: 05/01/2020 10/26/2019  Hearing? Y N  Comment has some tinnitus, hearing aids have been offered and tried patient did not like them -  Vision? N N  Difficulty concentrating or making decisions? N Y  Walking or climbing stairs? N N  Dressing or bathing? N N  Doing errands, shopping? N N  Preparing Food and eating ? N -  Using the Toilet? N -  In the past six months, have you accidently leaked urine? N -  Do you have problems with loss of bowel control? N -  Managing your Medications? N -  Managing your Finances? N -  Housekeeping or managing your Housekeeping? N -  Some recent data might be hidden    Patient Care Team: [name], MD as PCP - General (Family Medicine) Josue Hector, MD as PCP - Cardiology (Cardiology)  Indicate any recent Medical Services you may have received from other than [facility] providers in the past year (date may be approximate).     Assessment:   This is a routine wellness examination for Philicia.  Hearing/Vision screen  Hearing Screening   125Hz  250Hz  500Hz  1000Hz  2000Hz  3000Hz  4000Hz  6000Hz  8000Hz   Right ear:           Left ear:           Vision Screening Comments: Patient states gets eyes examined once per year. Wears glasses   Dietary issues and exercise activities discussed: Current Exercise Habits: Home exercise  routine, Type of exercise: walking, Time (Minutes): 20, Frequency (Times/Week): 5, Weekly Exercise (Minutes/Week): 100, Intensity: Mild  Goals    . Exercise 150 min/wk Moderate Activity    . Patient Stated     I would like to start swimming again      Depression Screen PHQ 2/9 Scores 05/01/2020 03/30/2019 11/20/2018 08/26/2017 05/14/2016 08/31/2014 06/27/2013  PHQ - 2 Score 0 0 0 0 0 0 0    Fall Risk Fall Risk  05/01/2020 03/30/2019 11/20/2018 08/26/2017 05/14/2016  Falls in the past year? 0 0 0 No No  Number falls in past yr: 0 - - - -  Injury with Fall? 0 - - - -  Risk for fall due to : No Fall Risks Medication side effect - - -  Follow up Falls evaluation completed;Falls prevention discussed Falls evaluation completed;Education provided;Falls prevention discussed Education provided;Falls evaluation completed;Falls prevention discussed - -    FALL RISK PREVENTION PERTAINING TO THE HOME:  Any stairs in or around the home? No  If so, are there any without handrails? No  Home free of loose throw rugs in walkways, pet beds, electrical cords, etc? Yes  Adequate lighting in your home to reduce risk of falls? Yes   ASSISTIVE DEVICES UTILIZED TO PREVENT FALLS:  Life alert? No  Use of a cane, walker or w/c? No  Grab bars in the bathroom? No  Shower chair or bench in shower? No  Elevated toilet seat or a handicapped toilet? Yes   TIMED UP AND GO:  Was the test performed? Yes .  Length of time to ambulate 10 feet: 3 sec.   Gait steady and fast without use of assistive device  Cognitive Function:   Normal cognitive status assessed by direct observation by this Nurse Health Advisor. No abnormalities found.     6CIT Screen 03/30/2019 03/30/2019  What Year? 0 points 0 points  What month? 0 points 0 points  What time? 0 points 0 points  Count back from 20 0 points 0 points  Months in reverse 0 points 0 points  Repeat phrase 0 points 0 points  Total Score 0 0    Immunizations Immunization  History  Administered Date(s) Administered  . Influenza Split 11/29/2010  . Influenza, High Dose Seasonal PF 11/27/2018  . Influenza,inj,Quad PF,6+ Mos 01/11/2014, 12/06/2016  . Influenza-Unspecified 12/22/2014, 12/19/2015, 11/26/2017, 12/03/2019  . PFIZER(Purple Top)SARS-COV-2 Vaccination 03/18/2019, 04/12/2019  . Pneumococcal Polysaccharide-23 01/23/2009  . Td 01/23/2009, 11/20/2018  . Zoster 07/10/2010    TDAP status: Up to date  Flu Vaccine status: Up to date  Pneumococcal vaccine status: Declined,  Education has been provided regarding the importance of this vaccine but patient still declined. Advised may receive this vaccine at local pharmacy or Health Dept. Aware to provide a copy of the vaccination record if obtained from local pharmacy or Health Dept. Verbalized acceptance and understanding.   Covid-19 vaccine status: Completed vaccines  Qualifies for Shingles Vaccine? Yes   Zostavax completed Yes   Shingrix Completed?: No.    Education has been provided regarding the importance of this vaccine. Patient has been advised to call insurance company to determine out of pocket expense if they have not yet received this vaccine. Advised may also receive vaccine at local pharmacy or Health Dept. Verbalized acceptance and understanding.  Screening Tests Health Maintenance  Topic Date Due  . Hepatitis C Screening  Never done  . COVID-19 Vaccine (3 - Pfizer risk 4-dose series) 05/10/2019  . COLONOSCOPY (Pts 45-27yrs Insurance coverage will need to be confirmed)  01/10/2020  . PNA vac Low Risk Adult (1 of 2 - PCV13) 12/30/2028 (Originally 06/06/2012)  . MAMMOGRAM  09/22/2020  . TETANUS/TDAP  11/19/2028  . INFLUENZA VACCINE  Completed  . DEXA SCAN  Completed  . HPV VACCINES  Aged Out    Health Maintenance  Health Maintenance Due  Topic Date Due  . Hepatitis C Screening  Never done  . COVID-19 Vaccine (3 - Pfizer risk 4-dose series) 05/10/2019  . COLONOSCOPY (Pts 45-18yrs  Insurance coverage will need to be confirmed)  01/10/2020    Colorectal cancer screening: Type of screening: Colonoscopy. Completed 01/10/2015. Repeat every 6 years  Mammogram status: Completed 09/23/2019. Repeat every year  Bone Density status: Completed 03/19/2018. Results reflect: Bone density results: NORMAL. Repeat every 5 years.  Lung Cancer Screening: (Low Dose CT Chest recommended if Age 36-80 years, 30 pack-year currently smoking OR have quit w/in 15years.) does not qualify.   Lung Cancer Screening Referral: N/A   Additional Screening:  Hepatitis C Screening: does qualify;   Vision Screening: Recommended annual ophthalmology exams for early detection of glaucoma and other disorders of the eye. Is the patient up to date with their annual eye exam?  Yes  Who is the provider or what is the name of the office in which the patient attends annual eye exams? [facility] on [address] If pt is not established with a provider, would they like to be referred to a provider to establish care? No .   Dental Screening: Recommended annual dental exams for proper oral hygiene  Community Resource Referral / Chronic Care Management: CRR required this visit?  No   CCM required this visit?  No      Plan:     I have personally reviewed and noted the following in the patient's chart:   . Medical and social history . Use of alcohol, tobacco or illicit drugs  .  Current medications and supplements . Functional ability and status . Nutritional status . Physical activity . Advanced directives . List of other physicians . Hospitalizations, surgeries, and ER visits in previous 12 months . Vitals . Screenings to include cognitive, depression, and falls . Referrals and appointments  In addition, I have reviewed and discussed with patient certain preventive protocols, quality metrics, and best practice recommendations. A written personalized care plan for preventive services as well as  general preventive health recommendations were provided to patient.     Ofilia Neas, LPN   5/50/1586   Nurse Notes: None

## 2020-05-01 ENCOUNTER — Ambulatory Visit (INDEPENDENT_AMBULATORY_CARE_PROVIDER_SITE_OTHER): Payer: Medicare HMO

## 2020-05-01 ENCOUNTER — Other Ambulatory Visit: Payer: Self-pay

## 2020-05-01 VITALS — BP 110/56 | HR 75 | Temp 98.2°F | Ht 65.0 in | Wt 139.4 lb

## 2020-05-01 DIAGNOSIS — Z0000 Encounter for general adult medical examination without abnormal findings: Secondary | ICD-10-CM

## 2020-05-01 NOTE — Patient Instructions (Signed)
Teresa Booker , Thank you for taking time to come for your Medicare Wellness Visit. I appreciate your ongoing commitment to your health goals. Please review the following plan we discussed and let me know if I can assist you in the future.   Screening recommendations/referrals: Colonoscopy: Up to date, next due 01/09/2022 Mammogram: Up to date, next due 09/22/2020 Bone Density:  No longer required  Recommended yearly ophthalmology/optometry visit for glaucoma screening and checkup Recommended yearly dental visit for hygiene and checkup  Vaccinations: Influenza vaccine: Up to date, next due fall 2022 2 Pneumococcal vaccine: Not indicated for you per your provider Tdap vaccine: Up to date, next due 11/19/2028 Shingles vaccine: Currently due for Shingrix, if you would like to receive we recommend that you do so at your local pharmacy     Advanced directives: Copies on file   Conditions/risks identified: None   Next appointment: None    Preventive Care 65 Years and Older, Female Preventive care refers to lifestyle choices and visits with your health care provider that can promote health and wellness. What does preventive care include?  A yearly physical exam. This is also called an annual well check.  Dental exams once or twice a year.  Routine eye exams. Ask your health care provider how often you should have your eyes checked.  Personal lifestyle choices, including:  Daily care of your teeth and gums.  Regular physical activity.  Eating a healthy diet.  Avoiding tobacco and drug use.  Limiting alcohol use.  Practicing safe sex.  Taking low-dose aspirin every day.  Taking vitamin and mineral supplements as recommended by your health care provider. What happens during an annual well check? The services and screenings done by your health care provider during your annual well check will depend on your age, overall health, lifestyle risk factors, and family history of  disease. Counseling  Your health care provider may ask you questions about your:  Alcohol use.  Tobacco use.  Drug use.  Emotional well-being.  Home and relationship well-being.  Sexual activity.  Eating habits.  History of falls.  Memory and ability to understand (cognition).  Work and work Statistician.  Reproductive health. Screening  You may have the following tests or measurements:  Height, weight, and BMI.  Blood pressure.  Lipid and cholesterol levels. These may be checked every 5 years, or more frequently if you are over 45 years old.  Skin check.  Lung cancer screening. You may have this screening every year starting at age 4 if you have a 30-pack-year history of smoking and currently smoke or have quit within the past 15 years.  Fecal occult blood test (FOBT) of the stool. You may have this test every year starting at age 66.  Flexible sigmoidoscopy or colonoscopy. You may have a sigmoidoscopy every 5 years or a colonoscopy every 10 years starting at age 54.  Hepatitis C blood test.  Hepatitis B blood test.  Sexually transmitted disease (STD) testing.  Diabetes screening. This is done by checking your blood sugar (glucose) after you have not eaten for a while (fasting). You may have this done every 1-3 years.  Bone density scan. This is done to screen for osteoporosis. You may have this done starting at age 60.  Mammogram. This may be done every 1-2 years. Talk to your health care provider about how often you should have regular mammograms. Talk with your health care provider about your test results, treatment options, and if necessary, the need for  more tests. Vaccines  Your health care provider may recommend certain vaccines, such as:  Influenza vaccine. This is recommended every year.  Tetanus, diphtheria, and acellular pertussis (Tdap, Td) vaccine. You may need a Td booster every 10 years.  Zoster vaccine. You may need this after age  43.  Pneumococcal 13-valent conjugate (PCV13) vaccine. One dose is recommended after age 90.  Pneumococcal polysaccharide (PPSV23) vaccine. One dose is recommended after age 20. Talk to your health care provider about which screenings and vaccines you need and how often you need them. This information is not intended to replace advice given to you by your health care provider. Make sure you discuss any questions you have with your health care provider. Document Released: 02/24/2015 Document Revised: 10/18/2015 Document Reviewed: 11/29/2014 Elsevier Interactive Patient Education  2017 [place] Prevention in the Home Falls can cause injuries. They can happen to people of all ages. There are many things you can do to make your home safe and to help prevent falls. What can I do on the outside of my home?  Regularly fix the edges of walkways and driveways and fix any cracks.  Remove anything that might make you trip as you walk through a door, such as a raised step or threshold.  Trim any bushes or trees on the path to your home.  Use bright outdoor lighting.  Clear any walking paths of anything that might make someone trip, such as rocks or tools.  Regularly check to see if handrails are loose or broken. Make sure that both sides of any steps have handrails.  Any raised decks and porches should have guardrails on the edges.  Have any leaves, snow, or ice cleared regularly.  Use sand or salt on walking paths during winter.  Clean up any spills in your garage right away. This includes oil or grease spills. What can I do in the bathroom?  Use night lights.  Install grab bars by the toilet and in the tub and shower. Do not use towel bars as grab bars.  Use non-skid mats or decals in the tub or shower.  If you need to sit down in the shower, use a plastic, non-slip stool.  Keep the floor dry. Clean up any water that spills on the floor as soon as it happens.  Remove  soap buildup in the tub or shower regularly.  Attach bath mats securely with double-sided non-slip rug tape.  Do not have throw rugs and other things on the floor that can make you trip. What can I do in the bedroom?  Use night lights.  Make sure that you have a light by your bed that is easy to reach.  Do not use any sheets or blankets that are too big for your bed. They should not hang down onto the floor.  Have a firm chair that has side arms. You can use this for support while you get dressed.  Do not have throw rugs and other things on the floor that can make you trip. What can I do in the kitchen?  Clean up any spills right away.  Avoid walking on wet floors.  Keep items that you use a lot in easy-to-reach places.  If you need to reach something above you, use a strong step stool that has a grab bar.  Keep electrical cords out of the way.  Do not use floor polish or wax that makes floors slippery. If you must use wax, use non-skid  floor wax.  Do not have throw rugs and other things on the floor that can make you trip. What can I do with my stairs?  Do not leave any items on the stairs.  Make sure that there are handrails on both sides of the stairs and use them. Fix handrails that are broken or loose. Make sure that handrails are as long as the stairways.  Check any carpeting to make sure that it is firmly attached to the stairs. Fix any carpet that is loose or worn.  Avoid having throw rugs at the top or bottom of the stairs. If you do have throw rugs, attach them to the floor with carpet tape.  Make sure that you have a light switch at the top of the stairs and the bottom of the stairs. If you do not have them, ask someone to add them for you. What else can I do to help prevent falls?  Wear shoes that:  Do not have high heels.  Have rubber bottoms.  Are comfortable and fit you well.  Are closed at the toe. Do not wear sandals.  If you use a  stepladder:  Make sure that it is fully opened. Do not climb a closed stepladder.  Make sure that both sides of the stepladder are locked into place.  Ask someone to hold it for you, if possible.  Clearly mark and make sure that you can see:  Any grab bars or handrails.  First and last steps.  Where the edge of each step is.  Use tools that help you move around (mobility aids) if they are needed. These include:  Canes.  Walkers.  Scooters.  Crutches.  Turn on the lights when you go into a dark area. Replace any light bulbs as soon as they burn out.  Set up your furniture so you have a clear path. Avoid moving your furniture around.  If any of your floors are uneven, fix them.  If there are any pets around you, be aware of where they are.  Review your medicines with your doctor. Some medicines can make you feel dizzy. This can increase your chance of falling. Ask your doctor what other things that you can do to help prevent falls. This information is not intended to replace advice given to you by your health care provider. Make sure you discuss any questions you have with your health care provider. Document Released: 11/24/2008 Document Revised: 07/06/2015 Document Reviewed: 03/04/2014 Elsevier Interactive Patient Education  2017 [employer].

## 2020-05-08 DIAGNOSIS — R002 Palpitations: Secondary | ICD-10-CM | POA: Diagnosis not present

## 2020-05-08 DIAGNOSIS — I10 Essential (primary) hypertension: Secondary | ICD-10-CM | POA: Diagnosis not present

## 2020-05-08 DIAGNOSIS — E785 Hyperlipidemia, unspecified: Secondary | ICD-10-CM | POA: Diagnosis not present

## 2020-05-08 DIAGNOSIS — Z7982 Long term (current) use of aspirin: Secondary | ICD-10-CM | POA: Diagnosis not present

## 2020-05-08 DIAGNOSIS — K219 Gastro-esophageal reflux disease without esophagitis: Secondary | ICD-10-CM | POA: Diagnosis not present

## 2020-05-08 DIAGNOSIS — T7840XD Allergy, unspecified, subsequent encounter: Secondary | ICD-10-CM | POA: Diagnosis not present

## 2020-05-08 DIAGNOSIS — Z823 Family history of stroke: Secondary | ICD-10-CM | POA: Diagnosis not present

## 2020-05-08 DIAGNOSIS — Z809 Family history of malignant neoplasm, unspecified: Secondary | ICD-10-CM | POA: Diagnosis not present

## 2020-05-08 DIAGNOSIS — R69 Illness, unspecified: Secondary | ICD-10-CM | POA: Diagnosis not present

## 2020-05-08 DIAGNOSIS — I951 Orthostatic hypotension: Secondary | ICD-10-CM | POA: Diagnosis not present

## 2020-05-25 ENCOUNTER — Other Ambulatory Visit: Payer: Self-pay

## 2020-05-25 ENCOUNTER — Ambulatory Visit (INDEPENDENT_AMBULATORY_CARE_PROVIDER_SITE_OTHER): Payer: Medicare HMO | Admitting: Obstetrics & Gynecology

## 2020-05-25 ENCOUNTER — Encounter: Payer: Self-pay | Admitting: Obstetrics & Gynecology

## 2020-05-25 VITALS — BP 140/78 | Ht 65.0 in | Wt 138.0 lb

## 2020-05-25 DIAGNOSIS — Z780 Asymptomatic menopausal state: Secondary | ICD-10-CM

## 2020-05-25 DIAGNOSIS — C3491 Malignant neoplasm of unspecified part of right bronchus or lung: Secondary | ICD-10-CM

## 2020-05-25 DIAGNOSIS — Z01419 Encounter for gynecological examination (general) (routine) without abnormal findings: Secondary | ICD-10-CM

## 2020-05-25 NOTE — Progress Notes (Signed)
    Teresa Booker Nov 15, 1945 [id number]   History:    75 y.o. G0Single. Same sex partner.  RW:[id number] presenting for annual gyn exam   IZT:[id number], well on no HRT. No PMB. No pelvic pain. Abstinent x 4 years. Breasts normal. Urine/BMs normal. BMI 22.96.  Had Rt lung Ca, excised Robotically 10/2019.  Doing very well and back to swimming. Healthy nutrition. Health Labs with Fam MD.  Followed by Cardio for Dysrhythmia. Colono 2016, will schedule this year.   Past medical history,surgical history, family history and social history were all reviewed and documented in the EPIC chart.  Gynecologic History No LMP recorded. Patient is postmenopausal.  Obstetric History OB History  Gravida Para Term Preterm AB Living  0 0 0 0 0 0  SAB IAB Ectopic Multiple Live Births  0 0 0 0 0     ROS: A ROS was performed and pertinent positives and negatives are included in the history.  GENERAL: No fevers or chills. HEENT: No change in vision, no earache, sore throat or sinus congestion. NECK: No pain or stiffness. CARDIOVASCULAR: No chest pain or pressure. No palpitations. PULMONARY: No shortness of breath, cough or wheeze. GASTROINTESTINAL: No abdominal pain, nausea, vomiting or diarrhea, melena or bright red blood per rectum. GENITOURINARY: No urinary frequency, urgency, hesitancy or dysuria. MUSCULOSKELETAL: No joint or muscle pain, no back pain, no recent trauma. DERMATOLOGIC: No rash, no itching, no lesions. ENDOCRINE: No polyuria, polydipsia, no heat or cold intolerance. No recent change in weight. HEMATOLOGICAL: No anemia or easy bruising or bleeding. NEUROLOGIC: No headache, seizures, numbness, tingling or weakness. PSYCHIATRIC: No depression, no loss of interest in normal activity or change in sleep pattern.     Exam:   BP 140/78   Ht 5\' 5"  (1.651 m)   Wt 138 lb (62.6 kg)   BMI 22.96 kg/m   Body mass index is 22.96 kg/m.  General appearance : Well developed  well nourished female. No acute distress HEENT: Eyes: no retinal hemorrhage or exudates,  Neck supple, trachea midline, no carotid bruits, no thyroidmegaly Lungs: Clear to auscultation, no rhonchi or wheezes, or rib retractions  Heart: Regular rate and rhythm, no murmurs or gallops Breast:Examined in sitting and supine position were symmetrical in appearance, no palpable masses or tenderness,  no skin retraction, no nipple inversion, no nipple discharge, no skin discoloration, no axillary or supraclavicular lymphadenopathy Abdomen: no palpable masses or tenderness, no rebound or guarding Extremities: no edema or skin discoloration or tenderness  Pelvic: Vulva: Normal             Vagina: No gross lesions or discharge  Cervix: No gross lesions or discharge  Uterus  AV, normal size, shape and consistency, non-tender and mobile  Adnexa  Without masses or tenderness  Anus: Normal   Assessment/Plan:  75 y.o. female for annual exam   1. Well female exam with routine gynecological exam Normal gynecologic exam.  Pap neg 2018, no need to repeat at this time.  Breasts normal.  Screening mammo neg 09/2019.  Colono 2016, will repeat this year.  Good BMI 22.96.  Swimmer.  Health labs with Fam MD.  2. Postmenopausal Well on no HRT.  No PMB.  BD normal in 03/2018.  3. Adenocarcinoma of lung, stage 1, right (HCC) Robotic excision Rt lower lobe of lung 10/2019.  Followed by Dutch Gray MD, 9:16 AM 05/25/2020

## 2020-06-02 ENCOUNTER — Telehealth: Payer: Self-pay | Admitting: Internal Medicine

## 2020-06-02 ENCOUNTER — Ambulatory Visit (INDEPENDENT_AMBULATORY_CARE_PROVIDER_SITE_OTHER): Payer: Medicare HMO | Admitting: Family Medicine

## 2020-06-02 ENCOUNTER — Other Ambulatory Visit: Payer: Self-pay

## 2020-06-02 ENCOUNTER — Encounter: Payer: Self-pay | Admitting: Family Medicine

## 2020-06-02 VITALS — BP 140/70 | HR 86 | Temp 97.9°F | Wt 140.0 lb

## 2020-06-02 DIAGNOSIS — R300 Dysuria: Secondary | ICD-10-CM

## 2020-06-02 LAB — POCT URINALYSIS DIPSTICK
Bilirubin, UA: NEGATIVE
Blood, UA: POSITIVE
Glucose, UA: NEGATIVE
Ketones, UA: NEGATIVE
Nitrite, UA: NEGATIVE
Protein, UA: POSITIVE — AB
Spec Grav, UA: 1.02 (ref 1.010–1.025)
Urobilinogen, UA: 0.2 E.U./dL
pH, UA: 6 (ref 5.0–8.0)

## 2020-06-02 MED ORDER — NITROFURANTOIN MONOHYD MACRO 100 MG PO CAPS: 100.0000 mg | ORAL_CAPSULE | Freq: Two times a day (BID) | ORAL | 0 refills | Status: DC

## 2020-06-02 NOTE — Progress Notes (Signed)
Established Patient Office Visit  Subjective:  Patient ID: Teresa Booker, female    DOB: 09/12/1945  Age: 75 y.o. MRN: [id number]  CC:  Chief Complaint  Patient presents with  . Urinary Tract Infection    X 2 days, urinary pressure, frequency, dysuria, pt states this morning she noticed a pink tint to her urine and there was something very small in it but not sure what it was.     HPI [name] presents for 2-day history of some urinary pressure and frequency along with mild burning with urination.  This morning she felt like her urine was pinkish in color and she saw small particulate item in her urine which she states was about half the size of a pea.  This was darkened in color and looked solid. No history of kidney stones.  She denies any recent flank pain but did have some mild right lower quadrant pain a couple days ago.  No fevers or chills.  She has reported allergies to sulfa and penicillin and apparently has had possible abdominal upset with Keflex as well.  She had CT coronary calcium studies last year which showed concern for lung nodule.  She had up having surgery and this came back stage I adenocarcinoma.  She has done well since then.  Has scheduled follow-up CT chest next week  Past Medical History:  Diagnosis Date  . Alcohol abuse    - abstemious 20 years. Attends AA - strong recovery   . Allergic reaction   . Allergy    seasonal  . Anxiety   . COPD (chronic obstructive pulmonary disease) (HCC)    minor  . Family history of adverse reaction to anesthesia    mother had problems with n/v  . Fibromyalgia    slight  . GERD (gastroesophageal reflux disease)   . Hyperlipemia   . Hypertension   . IBS (irritable bowel syndrome)   . Postnasal drip   . RBBB (right bundle branch block)   . Rotator cuff syndrome    right    Past Surgical History:  Procedure Laterality Date  . COLONOSCOPY    . INTERCOSTAL NERVE BLOCK Right 10/25/2019   Procedure: INTERCOSTAL NERVE  BLOCK;  Surgeon: [name], MD;  Location: [facility];  Service: Thoracic;  Laterality: Right;  . MOUTH SURGERY  2002  . NODE DISSECTION Right 10/25/2019   Procedure: NODE DISSECTION;  Surgeon: [name], MD;  Location: [facility];  Service: Thoracic;  Laterality: Right;  . ROTATOR CUFF REPAIR  10-24-05   had bone spur and laceration off cuff. Daldorf  . XI ROBOTIC ASSISTED THORACOSCOPY- SEGMENTECTOMY Right 10/25/2019   Procedure: XI ROBOTIC ASSISTED THORACOSCOPY-SUPERIOR SEGMENTECTOMY;  Surgeon: [name], MD;  Location: [facility];  Service: Thoracic;  Laterality: Right;    Family History  Problem Relation Age of Onset  . Hyperlipidemia Mother   . Irritable bowel syndrome Mother   . Coronary artery disease Mother   . Heart attack Mother   . Aneurysm Mother        brain  . Diabetes Brother   . Cancer Brother        small cell lung ca with mets/ smoker  . Stroke Other   . Aneurysm Other   . Lung cancer Other   . Colon cancer Neg Hx     Social History   Socioeconomic History  . Marital status: Single    Spouse name: Not on file  . Number  of children: 0  . Years of education: 68  . Highest education level: Bachelor's degree (e.g., BA, AB, BS)  Occupational History  . Occupation: Freight forwarder    Comment: retired  Tobacco Use  . Smoking status: Former Smoker    Packs/day: 1.00    Years: 25.00    Pack years: 25.00    Types: Cigarettes    Quit date: 03/12/1996    Years since quitting: 24.2  . Smokeless tobacco: Never Used  . Tobacco comment: quit appox 10 yrs ago  Vaping Use  . Vaping Use: Never used  Substance and Sexual Activity  . Alcohol use: No    Alcohol/week: 0.0 standard drinks    Comment: former [employer]uer AA 20+yrs ago strong recovery  . Drug use: No  . Sexual activity: Yes    Partners: Female  Other Topics Concern  . Not on file  Social History Narrative   03/30/19:   [employer]   Long-term relationship - '95   1 cat   Social  Determinants of Health   Financial Resource Strain: Low Risk   . Difficulty of Paying Living Expenses: Not hard at all  Food Insecurity: No Food Insecurity  . Worried About Charity fundraiser in the Last Year: Never true  . Ran Out of Food in the Last Year: Never true  Transportation Needs: No Transportation Needs  . Lack of Transportation (Medical): No  . Lack of Transportation (Non-Medical): No  Physical Activity: Insufficiently Active  . Days of Exercise per Week: 5 days  . Minutes of Exercise per Session: 20 min  Stress: No Stress Concern Present  . Feeling of Stress : Not at all  Social Connections: Moderately Integrated  . Frequency of Communication with Friends and Family: More than three times a week  . Frequency of Social Gatherings with Friends and Family: Never  . Attends Religious Services: Never  . Active Member of Clubs or Organizations: Yes  . Attends Archivist Meetings: Never  . Marital Status: Living with partner  Intimate Partner Violence: Not At Risk  . Fear of Current or Ex-Partner: No  . Emotionally Abused: No  . Physically Abused: No  . Sexually Abused: No    Outpatient Medications Prior to Visit  Medication Sig Dispense Refill  . aspirin EC 81 MG tablet Take 81 mg by mouth daily. Swallow whole.    . Calcium 200 MG TABS Take 200 mg by mouth every evening.     . hydrOXYzine (ATARAX/VISTARIL) 25 MG tablet TAKE 1 TABLET BY MOUTH EVERY 8 HOURS AS NEEDED 90 tablet 0  . hyoscyamine (LEVBID) 0.375 MG 12 hr tablet TAKE ONE TABLET BY MOUTH TWICE A DAY 180 tablet 0  . L-Lysine 500 MG TABS Take 500 mg by mouth daily.    Marland Kitchen loratadine (CLARITIN) 10 MG tablet Take 1 tablet (10 mg total) by mouth daily. 90 tablet 3  . Magnesium 250 MG TABS Take 250 mg by mouth every evening.     . metoprolol succinate (TOPROL-XL) 50 MG 24 hr tablet TAKE 1 TABLET BY MOUTH ONCE DAILY WITH A MEAL OR  IMMEDIATELY  FOLLOWING  A  MEAL (Patient taking differently: Take 50 mg by mouth  daily.) 90 tablet 3  . Multiple Vitamin (MULTIVITAMIN WITH MINERALS) TABS tablet Take 1 tablet by mouth daily.    Marland Kitchen omeprazole (PRILOSEC) 20 MG capsule Take 20 mg by mouth daily before breakfast.    . rosuvastatin (CRESTOR) 10 MG  tablet TAKE 1 TABLET BY MOUTH AT BEDTIME 90 tablet 3   No facility-administered medications prior to visit.    Allergies  Allergen Reactions  . Codeine     Makes heart race  . Penicillins     unknown  . Sulfonamide Derivatives     Has a rash with the use of silvadiene for burn  . Aspirin     Upset stomach, High dose only    ROS Review of Systems  Constitutional: Negative for chills and fever.  Respiratory: Negative for cough and shortness of breath.   Gastrointestinal: Negative for abdominal pain.  Genitourinary: Positive for dysuria and frequency. Negative for flank pain, vaginal bleeding and vaginal discharge.      Objective:    Physical Exam Vitals reviewed.  Constitutional:      Appearance: Normal appearance.  Cardiovascular:     Rate and Rhythm: Normal rate and regular rhythm.  Pulmonary:     Effort: Pulmonary effort is normal.     Breath sounds: Normal breath sounds.  Neurological:     Mental Status: She is alert.     BP 140/70 (BP Location: Left Arm, Patient Position: Sitting, Cuff Size: Normal)   Pulse 86   Temp 97.9 F (36.6 C) (Oral)   Wt 140 lb (63.5 kg)   SpO2 97%   BMI 23.30 kg/m  Wt Readings from Last 3 Encounters:  06/02/20 140 lb (63.5 kg)  05/25/20 138 lb (62.6 kg)  05/01/20 139 lb 7 oz (63.2 kg)     Health Maintenance Due  Topic Date Due  . Hepatitis C Screening  Never done  . COVID-19 Vaccine (3 - Pfizer risk 4-dose series) 05/10/2019  . COLONOSCOPY (Pts 45-55yrs Insurance coverage will need to be confirmed)  01/10/2020    There are no preventive care reminders to display for this patient.  Lab Results  Component Value Date   TSH 1.850 08/25/2018   Lab Results  Component Value Date   WBC 6.7  12/07/2019   HGB 11.7 (L) 12/07/2019   HCT 35.4 (L) 12/07/2019   MCV 92.7 12/07/2019   PLT 252 12/07/2019   Lab Results  Component Value Date   NA 137 12/07/2019   K 4.2 12/07/2019   CO2 28 12/07/2019   GLUCOSE 114 (H) 12/07/2019   BUN 19 12/07/2019   CREATININE 0.94 12/07/2019   BILITOT 0.3 01/14/2020   ALKPHOS 71 01/14/2020   AST 23 01/14/2020   ALT 22 01/14/2020   PROT 6.7 01/14/2020   ALBUMIN 4.3 01/14/2020   CALCIUM 9.6 12/07/2019   ANIONGAP 8 12/07/2019   GFR 60.97 01/02/2018   Lab Results  Component Value Date   CHOL 145 01/14/2020   Lab Results  Component Value Date   HDL 64 01/14/2020   Lab Results  Component Value Date   LDLCALC 63 01/14/2020   Lab Results  Component Value Date   TRIG 101 01/14/2020   Lab Results  Component Value Date   CHOLHDL 2.3 01/14/2020   Lab Results  Component Value Date   HGBA1C 5.9 (H) 03/03/2019      Assessment & Plan:   Problem List Items Addressed This Visit   None   Visit Diagnoses    Dysuria    -  Primary   Relevant Orders   POCT urinalysis dipstick (Completed)   Urine Culture     -Patient relates 2-day history of dysuria symptoms.  Urine dipstick does reveal positive blood and 3+ leukocytes which suggest possible infection.  She describes possible particulate matter in her urine this morning which raises question whether she may have passed a small stone either from her kidney or bladder.  She has no major pain at this time.  -Urine strainer is given to use for the next couple days -Stay well-hydrated -Start Macrobid 1 twice daily for 5 days pending culture results.  She has multiple intolerances as above -If urine culture negative will recommend follow-up UA in 2 weeks to make sure she does not have any persistent hematuria  Meds ordered this encounter  Medications  . nitrofurantoin, macrocrystal-monohydrate, (MACROBID) 100 MG capsule    Sig: Take 1 capsule (100 mg total) by mouth 2 (two) times daily.     Dispense:  10 capsule    Refill:  0    Follow-up: No follow-ups on file.    Carolann Littler, MD

## 2020-06-02 NOTE — Patient Instructions (Signed)
Dysuria Dysuria is pain or discomfort during urination. The pain or discomfort may be felt in the part of the body that drains urine from the bladder (urethra) or in the surrounding tissue of the genitals. The pain may also be felt in the groin area, lower abdomen, or lower back. You may have to urinate frequently or have the sudden feeling that you have to urinate (urgency). Dysuria can affect anyone, but it is more common in females. Dysuria can be caused by many different things, including:  Urinary tract infection.  Kidney stones or bladder stones.  Certain STIs (sexually transmitted infections), such as chlamydia.  Dehydration.  Inflammation of the tissues of the vagina.  Use of certain medicines.  Use of certain soaps or scented products that cause irritation. Follow these instructions at home: Medicines  Take over-the-counter and prescription medicines only as told by your health care provider.  If you were prescribed an antibiotic medicine, take it as told by your health care provider. Do not stop taking the antibiotic even if you start to feel better. Eating and drinking  Drink enough fluid to keep your urine pale yellow.  Avoid caffeinated beverages, tea, and alcohol. These beverages can irritate the bladder and make dysuria worse. In males, alcohol may irritate the prostate.   General instructions  Watch your condition for any changes.  Urinate often. Avoid holding urine for long periods of time.  If you are female, you should wipe from front to back after urinating or having a bowel movement. Use each piece of toilet paper only once.  Empty your bladder after sex.  Keep all follow-up visits. This is important.  If you had any tests done to find the cause of dysuria, it is up to you to get your test results. Ask your health care provider, or the department that is doing the test, when your results will be ready. Contact a health care provider if:  You have a  fever.  You develop pain in your back or sides.  You have nausea or vomiting.  You have blood in your urine.  You are not urinating as often as you usually do. Get help right away if:  Your pain is severe and not relieved with medicines.  You cannot eat or drink without vomiting.  You are confused.  You have a rapid heartbeat while resting.  You have shaking or chills.  You feel extremely weak. Summary  Dysuria is pain or discomfort while urinating. Many different conditions can lead to dysuria.  If you have dysuria, you may have to urinate frequently or have the sudden feeling that you have to urinate (urgency).  Watch your condition for any changes. Keep all follow-up visits.  Make sure that you urinate often and drink enough fluid to keep your urine pale yellow. This information is not intended to replace advice given to you by your health care provider. Make sure you discuss any questions you have with your health care provider. Document Revised: 09/10/2019 Document Reviewed: 09/10/2019 Elsevier Patient Education  [place].

## 2020-06-02 NOTE — Telephone Encounter (Signed)
R/s appt per 4/21 sch msg. Called pt, no answer. Left msg with appt date and time.

## 2020-06-05 ENCOUNTER — Telehealth: Payer: Self-pay | Admitting: Family Medicine

## 2020-06-05 ENCOUNTER — Other Ambulatory Visit: Payer: Self-pay

## 2020-06-05 ENCOUNTER — Encounter (HOSPITAL_COMMUNITY): Payer: Self-pay

## 2020-06-05 ENCOUNTER — Inpatient Hospital Stay: Payer: Medicare HMO

## 2020-06-05 ENCOUNTER — Inpatient Hospital Stay: Payer: Medicare HMO | Attending: Internal Medicine

## 2020-06-05 ENCOUNTER — Ambulatory Visit (HOSPITAL_COMMUNITY)
Admission: RE | Admit: 2020-06-05 | Discharge: 2020-06-05 | Disposition: A | Discharge status: Home / Self Care | Payer: Medicare HMO | Source: Ambulatory Visit | Attending: Physician Assistant | Admitting: Physician Assistant

## 2020-06-05 DIAGNOSIS — R69 Illness, unspecified: Secondary | ICD-10-CM | POA: Diagnosis not present

## 2020-06-05 DIAGNOSIS — C3491 Malignant neoplasm of unspecified part of right bronchus or lung: Secondary | ICD-10-CM

## 2020-06-05 DIAGNOSIS — J449 Chronic obstructive pulmonary disease, unspecified: Secondary | ICD-10-CM | POA: Insufficient documentation

## 2020-06-05 DIAGNOSIS — C3490 Malignant neoplasm of unspecified part of unspecified bronchus or lung: Secondary | ICD-10-CM | POA: Diagnosis not present

## 2020-06-05 DIAGNOSIS — F419 Anxiety disorder, unspecified: Secondary | ICD-10-CM | POA: Diagnosis not present

## 2020-06-05 DIAGNOSIS — Z79899 Other long term (current) drug therapy: Secondary | ICD-10-CM | POA: Diagnosis not present

## 2020-06-05 DIAGNOSIS — I10 Essential (primary) hypertension: Secondary | ICD-10-CM | POA: Diagnosis not present

## 2020-06-05 DIAGNOSIS — J984 Other disorders of lung: Secondary | ICD-10-CM | POA: Diagnosis not present

## 2020-06-05 DIAGNOSIS — E785 Hyperlipidemia, unspecified: Secondary | ICD-10-CM | POA: Insufficient documentation

## 2020-06-05 DIAGNOSIS — K219 Gastro-esophageal reflux disease without esophagitis: Secondary | ICD-10-CM | POA: Diagnosis not present

## 2020-06-05 DIAGNOSIS — M797 Fibromyalgia: Secondary | ICD-10-CM | POA: Diagnosis not present

## 2020-06-05 DIAGNOSIS — C3431 Malignant neoplasm of lower lobe, right bronchus or lung: Secondary | ICD-10-CM | POA: Diagnosis not present

## 2020-06-05 DIAGNOSIS — Z7982 Long term (current) use of aspirin: Secondary | ICD-10-CM | POA: Diagnosis not present

## 2020-06-05 DIAGNOSIS — I2510 Atherosclerotic heart disease of native coronary artery without angina pectoris: Secondary | ICD-10-CM | POA: Diagnosis not present

## 2020-06-05 DIAGNOSIS — I708 Atherosclerosis of other arteries: Secondary | ICD-10-CM | POA: Diagnosis not present

## 2020-06-05 HISTORY — DX: Malignant (primary) neoplasm, unspecified: C80.1

## 2020-06-05 LAB — CBC WITH DIFFERENTIAL (CANCER CENTER ONLY)
Abs Immature Granulocytes: 0.02 10*3/uL (ref 0.00–0.07)
Basophils Absolute: 0.1 10*3/uL (ref 0.0–0.1)
Basophils Relative: 1 %
Eosinophils Absolute: 0.3 10*3/uL (ref 0.0–0.5)
Eosinophils Relative: 6 %
HCT: 37 % (ref 36.0–46.0)
Hemoglobin: 12.1 g/dL (ref 12.0–15.0)
Immature Granulocytes: 0 %
Lymphocytes Relative: 30 %
Lymphs Abs: 1.7 10*3/uL (ref 0.7–4.0)
MCH: 30.4 pg (ref 26.0–34.0)
MCHC: 32.7 g/dL (ref 30.0–36.0)
MCV: 93 fL (ref 80.0–100.0)
Monocytes Absolute: 0.7 10*3/uL (ref 0.1–1.0)
Monocytes Relative: 12 %
Neutro Abs: 2.9 10*3/uL (ref 1.7–7.7)
Neutrophils Relative %: 51 %
Platelet Count: 230 10*3/uL (ref 150–400)
RBC: 3.98 MIL/uL (ref 3.87–5.11)
RDW: 12.6 % (ref 11.5–15.5)
WBC Count: 5.6 10*3/uL (ref 4.0–10.5)
nRBC: 0 % (ref 0.0–0.2)

## 2020-06-05 LAB — URINE CULTURE
MICRO NUMBER:: 11803161
SPECIMEN QUALITY:: ADEQUATE

## 2020-06-05 LAB — CMP (CANCER CENTER ONLY)
ALT: 20 U/L (ref 0–44)
AST: 25 U/L (ref 15–41)
Albumin: 4 g/dL (ref 3.5–5.0)
Alkaline Phosphatase: 66 U/L (ref 38–126)
Anion gap: 10 (ref 5–15)
BUN: 13 mg/dL (ref 8–23)
CO2: 29 mmol/L (ref 22–32)
Calcium: 9.3 mg/dL (ref 8.9–10.3)
Chloride: 104 mmol/L (ref 98–111)
Creatinine: 0.95 mg/dL (ref 0.44–1.00)
GFR, Estimated: >60 mL/min (ref >60)
Glucose, Bld: 108 mg/dL — ABNORMAL HIGH (ref 70–99)
Potassium: 4.3 mmol/L (ref 3.5–5.1)
Sodium: 143 mmol/L (ref 135–145)
Total Bilirubin: 0.3 mg/dL (ref 0.3–1.2)
Total Protein: 7 g/dL (ref 6.5–8.1)

## 2020-06-05 MED ORDER — IOHEXOL 300 MG/ML  SOLN
75.0000 mL | Freq: Once | INTRAMUSCULAR | Status: AC | PRN
  Administered 2020-06-05: 75 mL via INTRAVENOUS

## 2020-06-05 NOTE — Telephone Encounter (Signed)
Pt is returning Lindsay's call for lab results.

## 2020-06-05 NOTE — Telephone Encounter (Signed)
Discussed results with patient, patient expressed understanding. Nothing further needed. ° °

## 2020-06-07 ENCOUNTER — Other Ambulatory Visit: Payer: Self-pay

## 2020-06-07 ENCOUNTER — Inpatient Hospital Stay: Payer: Medicare HMO | Admitting: Internal Medicine

## 2020-06-07 VITALS — BP 139/64 | HR 75 | Temp 97.4°F | Resp 19 | Ht 65.0 in | Wt 139.7 lb

## 2020-06-07 DIAGNOSIS — Z79899 Other long term (current) drug therapy: Secondary | ICD-10-CM | POA: Diagnosis not present

## 2020-06-07 DIAGNOSIS — I10 Essential (primary) hypertension: Secondary | ICD-10-CM | POA: Diagnosis not present

## 2020-06-07 DIAGNOSIS — M797 Fibromyalgia: Secondary | ICD-10-CM | POA: Diagnosis not present

## 2020-06-07 DIAGNOSIS — J449 Chronic obstructive pulmonary disease, unspecified: Secondary | ICD-10-CM | POA: Diagnosis not present

## 2020-06-07 DIAGNOSIS — C3491 Malignant neoplasm of unspecified part of right bronchus or lung: Secondary | ICD-10-CM

## 2020-06-07 DIAGNOSIS — K219 Gastro-esophageal reflux disease without esophagitis: Secondary | ICD-10-CM | POA: Diagnosis not present

## 2020-06-07 DIAGNOSIS — R69 Illness, unspecified: Secondary | ICD-10-CM | POA: Diagnosis not present

## 2020-06-07 DIAGNOSIS — C3490 Malignant neoplasm of unspecified part of unspecified bronchus or lung: Secondary | ICD-10-CM | POA: Diagnosis not present

## 2020-06-07 DIAGNOSIS — E785 Hyperlipidemia, unspecified: Secondary | ICD-10-CM | POA: Diagnosis not present

## 2020-06-07 DIAGNOSIS — Z7982 Long term (current) use of aspirin: Secondary | ICD-10-CM | POA: Diagnosis not present

## 2020-06-07 DIAGNOSIS — C3431 Malignant neoplasm of lower lobe, right bronchus or lung: Secondary | ICD-10-CM | POA: Diagnosis not present

## 2020-06-07 NOTE — Progress Notes (Signed)
[place] [phone]   Fax:([phone]) [phone]  OFFICE PROGRESS NOTE  Teresa Post, MD [address] [place] [address]  DIAGNOSIS: Stage IA (T1b, N0, M0) non-small cell lung cancer, adenocarcinoma.  She presented with a right lower lobe pulmonary nodule.  She was diagnosed in September 2021.  PRIOR THERAPY: Status Booker robotic assisted right thoracoscopy with right lower lobe superior segmentectomy and lymph node dissection under the care of Dr. Roxan Hockey on 10/25/2019.  CURRENT THERAPY: Observation.  INTERVAL HISTORY: Teresa Booker 75 y.o. female returns to the clinic today for follow-up visit accompanied by her partner.  The patient is feeling fine today with no concerning complaints.  She denied having any chest pain, shortness of breath, cough or hemoptysis.  She denied having any fever or chills.  She has no nausea, vomiting, diarrhea or constipation.  She has no headache or visual changes.  She has no weight loss or night sweats.  She celebrated her birthday yesterday.  The patient had repeat CT scan of the chest performed recently and she is here for evaluation and discussion of her risk her results.  MEDICAL HISTORY: Past Medical History:  Diagnosis Date  . Alcohol abuse    - abstemious 20 years. Attends AA - strong recovery   . Allergic reaction   . Allergy    seasonal  . Anxiety   . COPD (chronic obstructive pulmonary disease) (HCC)    minor  . Family history of adverse reaction to anesthesia    mother had problems with n/v  . Fibromyalgia    slight  . GERD (gastroesophageal reflux disease)   . Hyperlipemia   . Hypertension   . IBS (irritable bowel syndrome)   . NSCL Ca dx'd 10/24/20  . Postnasal drip   . RBBB (right bundle branch block)   . Rotator cuff syndrome    right    ALLERGIES:  is allergic to codeine, penicillins, sulfonamide derivatives, and aspirin.  MEDICATIONS:  Current Outpatient Medications   Medication Sig Dispense Refill  . aspirin EC 81 MG tablet Take 81 mg by mouth daily. Swallow whole.    . Calcium 200 MG TABS Take 200 mg by mouth every evening.     . hydrOXYzine (ATARAX/VISTARIL) 25 MG tablet TAKE 1 TABLET BY MOUTH EVERY 8 HOURS AS NEEDED 90 tablet 0  . hyoscyamine (LEVBID) 0.375 MG 12 hr tablet TAKE ONE TABLET BY MOUTH TWICE A DAY 180 tablet 0  . L-Lysine 500 MG TABS Take 500 mg by mouth daily.    Marland Kitchen loratadine (CLARITIN) 10 MG tablet Take 1 tablet (10 mg total) by mouth daily. 90 tablet 3  . Magnesium 250 MG TABS Take 250 mg by mouth every evening.     . metoprolol succinate (TOPROL-XL) 50 MG 24 hr tablet TAKE 1 TABLET BY MOUTH ONCE DAILY WITH A MEAL OR  IMMEDIATELY  FOLLOWING  A  MEAL (Patient taking differently: Take 50 mg by mouth daily.) 90 tablet 3  . Multiple Vitamin (MULTIVITAMIN WITH MINERALS) TABS tablet Take 1 tablet by mouth daily.    . nitrofurantoin, macrocrystal-monohydrate, (MACROBID) 100 MG capsule Take 1 capsule (100 mg total) by mouth 2 (two) times daily. 10 capsule 0  . omeprazole (PRILOSEC) 20 MG capsule Take 20 mg by mouth daily before breakfast.    . rosuvastatin (CRESTOR) 10 MG tablet TAKE 1 TABLET BY MOUTH AT BEDTIME 90 tablet 3   No current facility-administered medications for this visit.  SURGICAL HISTORY:  Past Surgical History:  Procedure Laterality Date  . COLONOSCOPY    . INTERCOSTAL NERVE BLOCK Right 10/25/2019   Procedure: INTERCOSTAL NERVE BLOCK;  Surgeon: [name], MD;  Location: [place];  Service: Thoracic;  Laterality: Right;  . MOUTH SURGERY  2002  . NODE DISSECTION Right 10/25/2019   Procedure: NODE DISSECTION;  Surgeon: [name], MD;  Location: [facility];  Service: Thoracic;  Laterality: Right;  . ROTATOR CUFF REPAIR  10-24-05   had bone spur and laceration off cuff. Daldorf  . XI ROBOTIC ASSISTED THORACOSCOPY- SEGMENTECTOMY Right 10/25/2019   Procedure: XI ROBOTIC ASSISTED THORACOSCOPY-SUPERIOR SEGMENTECTOMY;   Surgeon: [name], MD;  Location: [facility] OR;  Service: Thoracic;  Laterality: Right;    REVIEW OF SYSTEMS:  A comprehensive review of systems was negative.   PHYSICAL EXAMINATION: General appearance: alert, cooperative and no distress Head: Normocephalic, without obvious abnormality, atraumatic Neck: no adenopathy, no JVD, supple, symmetrical, trachea midline and thyroid not enlarged, symmetric, no tenderness/mass/nodules Lymph nodes: Cervical, supraclavicular, and axillary nodes normal. Resp: clear to auscultation bilaterally Back: symmetric, no curvature. ROM normal. No CVA tenderness. Cardio: regular rate and rhythm, S1, S2 normal, no murmur, click, rub or gallop GI: soft, non-tender; bowel sounds normal; no masses,  no organomegaly Extremities: extremities normal, atraumatic, no cyanosis or edema  ECOG PERFORMANCE STATUS: 1 - Symptomatic but completely ambulatory  Blood pressure 139/64, pulse 75, temperature (!) 97.4 F (36.3 C), temperature source Tympanic, resp. rate 19, height 5\' 5"  (1.651 m), weight 139 lb 11.2 oz (63.4 kg), SpO2 100 %.  LABORATORY DATA: Lab Results  Component Value Date   WBC 5.6 06/05/2020   HGB 12.1 06/05/2020   HCT 37.0 06/05/2020   MCV 93.0 06/05/2020   PLT 230 06/05/2020      Chemistry      Component Value Date/Time   NA 143 06/05/2020 0741   NA 143 08/25/2018 1043   K 4.3 06/05/2020 0741   CL 104 06/05/2020 0741   CO2 29 06/05/2020 0741   BUN 13 06/05/2020 0741   BUN 17 08/25/2018 1043   CREATININE 0.95 06/05/2020 0741      Component Value Date/Time   CALCIUM 9.3 06/05/2020 0741   ALKPHOS 66 06/05/2020 0741   AST 25 06/05/2020 0741   ALT 20 06/05/2020 0741   BILITOT 0.3 06/05/2020 0741       RADIOGRAPHIC STUDIES: CT Chest W Contrast  Result Date: 06/05/2020 CLINICAL DATA:  Lung cancer, assess treatment response. EXAM: CT CHEST WITH CONTRAST TECHNIQUE: Multidetector CT imaging of the chest was performed during intravenous  contrast administration. CONTRAST:  60mL OMNIPAQUE IOHEXOL 300 MG/ML  SOLN COMPARISON:  09/15/2019. FINDINGS: Cardiovascular: Atherosclerotic calcification of the aorta and coronary arteries. Heart size normal. No pericardial effusion. Mediastinum/Nodes: No pathologically enlarged mediastinal, hilar or axillary lymph nodes. Esophagus is grossly unremarkable. Lungs/Pleura: Postoperative changes in the right hemithorax with associated scarring and mild volume loss. Trace loculated posteromedial right pleural fluid. Mild peribronchovascular nodularity in the posterior left lower lobe, similar. No left pleural fluid. Airway is unremarkable. Upper Abdomen: Liver may be slightly decreased in attenuation diffusely. Visualized portions of the liver, gallbladder, adrenal glands, kidneys, spleen, pancreas, stomach and bowel are otherwise grossly unremarkable. No upper abdominal adenopathy. Musculoskeletal: Degenerative changes in the spine. No worrisome lytic or sclerotic lesions. IMPRESSION: 1. Interval right lower lobe wedge resection without evidence of recurrent or metastatic disease. 2. Liver may be steatotic. 3. Aortic atherosclerosis (ICD10-I70.0). Coronary artery calcification.  Electronically Signed   By: Lorin Picket M.D.   On: 06/05/2020 11:51    ASSESSMENT AND PLAN: This is a very pleasant 75 years old white female recently diagnosed with a stage IA (T1b, N0, M0) non-small cell lung cancer, adenocarcinoma diagnosed in September 2021 status Booker right lower lobe superior segmentectomy with lymph node dissection under the care of Dr. Roxan Hockey. The patient is currently on observation and she is feeling fine today with no concerning complaints. She had repeat CT scan of the chest performed recently.  I personally and independently reviewed the scans and discussed the results with the patient today. Her scan showed no concerning findings for disease recurrence or metastasis. I recommended for her to  continue on observation with repeat CT scan of the chest in 6 months. The patient was advised to call immediately if she has any other concerning symptoms in the interval. The patient voices understanding of current disease status and treatment options and is in agreement with the current care plan.  All questions were answered. The patient knows to call the clinic with any problems, questions or concerns. We can certainly see the patient much sooner if necessary.  The total time spent in the appointment was 20 minutes.  Disclaimer: This note was dictated with voice recognition software. Similar sounding words can inadvertently be transcribed and may not be corrected upon review.

## 2020-06-08 ENCOUNTER — Telehealth: Payer: Self-pay | Admitting: Internal Medicine

## 2020-06-08 NOTE — Telephone Encounter (Signed)
Scheduled per los. Called and spoke with patient. Confirmed appt 

## 2020-06-12 ENCOUNTER — Encounter: Payer: Self-pay | Admitting: Family Medicine

## 2020-06-12 ENCOUNTER — Other Ambulatory Visit: Payer: Self-pay

## 2020-06-12 ENCOUNTER — Ambulatory Visit (INDEPENDENT_AMBULATORY_CARE_PROVIDER_SITE_OTHER): Payer: Medicare HMO | Admitting: Family Medicine

## 2020-06-12 VITALS — BP 130/60 | HR 74 | Temp 97.9°F | Wt 141.8 lb

## 2020-06-12 DIAGNOSIS — R300 Dysuria: Secondary | ICD-10-CM

## 2020-06-12 LAB — POCT URINALYSIS DIPSTICK
Bilirubin, UA: NEGATIVE
Blood, UA: NEGATIVE
Glucose, UA: NEGATIVE
Ketones, UA: NEGATIVE
Leukocytes, UA: NEGATIVE
Nitrite, UA: NEGATIVE
Protein, UA: NEGATIVE
Spec Grav, UA: 1.025 (ref 1.010–1.025)
Urobilinogen, UA: 0.2 E.U./dL
pH, UA: 5.5 (ref 5.0–8.0)

## 2020-06-12 NOTE — Progress Notes (Signed)
Established Patient Office Visit  Subjective:  Patient ID: Teresa Booker, female    DOB: 05/09/1945  Age: 75 y.o. MRN: [id number]  CC: No chief complaint on file.   HPI Teresa Booker presents for follow-up visit from the 22nd.  She had presented with some suprapubic pressure and frequency with mild burning.  Her urine dipstick suggested possible UTI.  Her culture came back with E. coli but relatively low colony count.  We ended up starting Macrobid and she states her symptoms have improved.  She has not had any gross hematuria.  No burning with urination at this time.  We had suggested follow-up to make sure no evidence for persistent hematuria given the fact that her culture was somewhat equivocal  Past Medical History:  Diagnosis Date  . Alcohol abuse    - abstemious 20 years. Attends AA - strong recovery   . Allergic reaction   . Allergy    seasonal  . Anxiety   . COPD (chronic obstructive pulmonary disease) (HCC)    minor  . Family history of adverse reaction to anesthesia    mother had problems with n/v  . Fibromyalgia    slight  . GERD (gastroesophageal reflux disease)   . Hyperlipemia   . Hypertension   . IBS (irritable bowel syndrome)   . NSCL Ca dx'd 10/24/20  . Postnasal drip   . RBBB (right bundle branch block)   . Rotator cuff syndrome    right    Past Surgical History:  Procedure Laterality Date  . COLONOSCOPY    . INTERCOSTAL NERVE BLOCK Right 10/25/2019   Procedure: INTERCOSTAL NERVE BLOCK;  Surgeon: [name], MD;  Location: [place];  Service: Thoracic;  Laterality: Right;  . MOUTH SURGERY  2002  . NODE DISSECTION Right 10/25/2019   Procedure: NODE DISSECTION;  Surgeon: [name], MD;  Location: [place];  Service: Thoracic;  Laterality: Right;  . ROTATOR CUFF REPAIR  10-24-05   had bone spur and laceration off cuff. Daldorf  . XI ROBOTIC ASSISTED THORACOSCOPY- SEGMENTECTOMY Right 10/25/2019   Procedure: XI ROBOTIC ASSISTED  THORACOSCOPY-SUPERIOR SEGMENTECTOMY;  Surgeon: [name], MD;  Location: [facility] OR;  Service: Thoracic;  Laterality: Right;    Family History  Problem Relation Age of Onset  . Hyperlipidemia Mother   . Irritable bowel syndrome Mother   . Coronary artery disease Mother   . Heart attack Mother   . Aneurysm Mother        brain  . Diabetes Brother   . Cancer Brother        small cell lung ca with mets/ smoker  . Stroke Other   . Aneurysm Other   . Lung cancer Other   . Colon cancer Neg Hx     Social History   Socioeconomic History  . Marital status: Single    Spouse name: Not on file  . Number of children: 0  . Years of education: 16  . Highest education level: Bachelor's degree (e.g., BA, AB, BS)  Occupational History  . Occupation: Freight forwarder    Comment: retired  Tobacco Use  . Smoking status: Former Smoker    Packs/day: 1.00    Years: 25.00    Pack years: 25.00    Types: Cigarettes    Quit date: 03/12/1996    Years since quitting: 24.2  . Smokeless tobacco: Never Used  . Tobacco comment: quit appox 10 yrs ago  Vaping Use  . Vaping Use: Never  used  Substance and Sexual Activity  . Alcohol use: No    Alcohol/week: 0.0 standard drinks    Comment: former [employer] AA 20+yrs ago strong recovery  . Drug use: No  . Sexual activity: Yes    Partners: Female  Other Topics Concern  . Not on file  Social History Narrative   03/30/19:   [employer]   Long-term relationship - '95   1 cat   Social Determinants of Health   Financial Resource Strain: Low Risk   . Difficulty of Paying Living Expenses: Not hard at all  Food Insecurity: No Food Insecurity  . Worried About Charity fundraiser in the Last Year: Never true  . Ran Out of Food in the Last Year: Never true  Transportation Needs: No Transportation Needs  . Lack of Transportation (Medical): No  . Lack of Transportation (Non-Medical): No  Physical Activity: Insufficiently Active  . Days of  Exercise per Week: 5 days  . Minutes of Exercise per Session: 20 min  Stress: No Stress Concern Present  . Feeling of Stress : Not at all  Social Connections: Moderately Integrated  . Frequency of Communication with Friends and Family: More than three times a week  . Frequency of Social Gatherings with Friends and Family: Never  . Attends Religious Services: Never  . Active Member of Clubs or Organizations: Yes  . Attends Archivist Meetings: Never  . Marital Status: Living with partner  Intimate Partner Violence: Not At Risk  . Fear of Current or Ex-Partner: No  . Emotionally Abused: No  . Physically Abused: No  . Sexually Abused: No    Outpatient Medications Prior to Visit  Medication Sig Dispense Refill  . aspirin EC 81 MG tablet Take 81 mg by mouth daily. Swallow whole.    . Calcium 200 MG TABS Take 200 mg by mouth every evening.     . hydrOXYzine (ATARAX/VISTARIL) 25 MG tablet TAKE 1 TABLET BY MOUTH EVERY 8 HOURS AS NEEDED 90 tablet 0  . hyoscyamine (LEVBID) 0.375 MG 12 hr tablet TAKE ONE TABLET BY MOUTH TWICE A DAY 180 tablet 0  . L-Lysine 500 MG TABS Take 500 mg by mouth daily.    Marland Kitchen loratadine (CLARITIN) 10 MG tablet Take 1 tablet (10 mg total) by mouth daily. 90 tablet 3  . Magnesium 250 MG TABS Take 250 mg by mouth every evening.     . metoprolol succinate (TOPROL-XL) 50 MG 24 hr tablet TAKE 1 TABLET BY MOUTH ONCE DAILY WITH A MEAL OR  IMMEDIATELY  FOLLOWING  A  MEAL (Patient taking differently: Take 50 mg by mouth daily.) 90 tablet 3  . Multiple Vitamin (MULTIVITAMIN WITH MINERALS) TABS tablet Take 1 tablet by mouth daily.    . nitrofurantoin, macrocrystal-monohydrate, (MACROBID) 100 MG capsule Take 1 capsule (100 mg total) by mouth 2 (two) times daily. 10 capsule 0  . omeprazole (PRILOSEC) 20 MG capsule Take 20 mg by mouth daily before breakfast.    . rosuvastatin (CRESTOR) 10 MG tablet TAKE 1 TABLET BY MOUTH AT BEDTIME 90 tablet 3   No facility-administered  medications prior to visit.    Allergies  Allergen Reactions  . Codeine     Makes heart race  . Penicillins     unknown  . Sulfonamide Derivatives     Has a rash with the use of silvadiene for burn  . Aspirin     Upset stomach, High dose only  ROS Review of Systems  Constitutional: Negative for chills and fever.  Genitourinary: Negative for flank pain and hematuria.      Objective:    Physical Exam Vitals reviewed.  Constitutional:      Appearance: Normal appearance.  Cardiovascular:     Rate and Rhythm: Normal rate and regular rhythm.  Neurological:     Mental Status: She is alert.     BP 130/60 (BP Location: Left Arm, Patient Position: Sitting, Cuff Size: Normal)   Pulse 74   Temp 97.9 F (36.6 C) (Oral)   Wt 141 lb 12.8 oz (64.3 kg)   SpO2 99%   BMI 23.60 kg/m  Wt Readings from Last 3 Encounters:  06/12/20 141 lb 12.8 oz (64.3 kg)  06/07/20 139 lb 11.2 oz (63.4 kg)  06/02/20 140 lb (63.5 kg)     Health Maintenance Due  Topic Date Due  . Hepatitis C Screening  Never done  . COVID-19 Vaccine (3 - Pfizer risk 4-dose series) 05/10/2019  . COLONOSCOPY (Pts 45-43yrs Insurance coverage will need to be confirmed)  01/10/2020    There are no preventive care reminders to display for this patient.  Lab Results  Component Value Date   TSH 1.850 08/25/2018   Lab Results  Component Value Date   WBC 5.6 06/05/2020   HGB 12.1 06/05/2020   HCT 37.0 06/05/2020   MCV 93.0 06/05/2020   PLT 230 06/05/2020   Lab Results  Component Value Date   NA 143 06/05/2020   K 4.3 06/05/2020   CO2 29 06/05/2020   GLUCOSE 108 (H) 06/05/2020   BUN 13 06/05/2020   CREATININE 0.95 06/05/2020   BILITOT 0.3 06/05/2020   ALKPHOS 66 06/05/2020   AST 25 06/05/2020   ALT 20 06/05/2020   PROT 7.0 06/05/2020   ALBUMIN 4.0 06/05/2020   CALCIUM 9.3 06/05/2020   ANIONGAP 10 06/05/2020   GFR 60.97 01/02/2018   Lab Results  Component Value Date   CHOL 145 01/14/2020    Lab Results  Component Value Date   HDL 64 01/14/2020   Lab Results  Component Value Date   LDLCALC 63 01/14/2020   Lab Results  Component Value Date   TRIG 101 01/14/2020   Lab Results  Component Value Date   CHOLHDL 2.3 01/14/2020   Lab Results  Component Value Date   HGBA1C 5.9 (H) 03/03/2019      Assessment & Plan:   Problem List Items Addressed This Visit   None   Visit Diagnoses    Dysuria    -  Primary   Relevant Orders   POCT rapid strep A (Completed)    Patient had recent dysuria and symptoms improved on Macrobid.  Positive culture for E. coli with low colony count.  -Repeat urine dipstick today is completely clear. -Continue to stay well-hydrated and follow-up for any recurrent symptoms.  No orders of the defined types were placed in this encounter.   Follow-up: No follow-ups on file.    Carolann Littler, MD

## 2020-06-12 NOTE — Patient Instructions (Signed)
Follow up for any recurrent urinary symptoms  Urine dip today is completely clear.

## 2020-06-13 NOTE — Progress Notes (Signed)
Order(s) created erroneously. Erroneous order ID: [id number]  Order moved by: Pete Pelt  Order move date/time: 06/13/2020 12:17 PM  Source Patient: [id number]  Source Contact: 06/12/2020  Destination Patient: [id number]  Destination Contact: 05/02/2020

## 2020-06-19 ENCOUNTER — Other Ambulatory Visit: Payer: Self-pay | Admitting: Family Medicine

## 2020-06-20 ENCOUNTER — Other Ambulatory Visit: Payer: Self-pay | Admitting: Family Medicine

## 2020-06-20 ENCOUNTER — Ambulatory Visit: Payer: Medicare HMO | Admitting: Thoracic Surgery (Cardiothoracic Vascular Surgery)

## 2020-06-20 MED ORDER — HYOSCYAMINE SULFATE ER 0.375 MG PO TB12: 0.3750 mg | ORAL_TABLET | Freq: Two times a day (BID) | ORAL | 0 refills | Status: DC

## 2020-06-22 ENCOUNTER — Other Ambulatory Visit: Payer: Self-pay | Admitting: Thoracic Surgery (Cardiothoracic Vascular Surgery)

## 2020-06-22 DIAGNOSIS — C3491 Malignant neoplasm of unspecified part of right bronchus or lung: Secondary | ICD-10-CM

## 2020-06-27 ENCOUNTER — Other Ambulatory Visit: Payer: Self-pay

## 2020-06-27 ENCOUNTER — Encounter: Payer: Self-pay | Admitting: Thoracic Surgery (Cardiothoracic Vascular Surgery)

## 2020-06-27 ENCOUNTER — Ambulatory Visit
Admission: RE | Admit: 2020-06-27 | Discharge: 2020-06-27 | Disposition: A | Discharge status: Home / Self Care | Payer: Medicare HMO | Source: Ambulatory Visit | Attending: Thoracic Surgery (Cardiothoracic Vascular Surgery) | Admitting: Thoracic Surgery (Cardiothoracic Vascular Surgery)

## 2020-06-27 ENCOUNTER — Ambulatory Visit: Payer: Medicare HMO | Admitting: Thoracic Surgery (Cardiothoracic Vascular Surgery)

## 2020-06-27 VITALS — BP 117/64 | HR 74 | Resp 20 | Ht 65.0 in | Wt 138.0 lb

## 2020-06-27 DIAGNOSIS — C3491 Malignant neoplasm of unspecified part of right bronchus or lung: Secondary | ICD-10-CM

## 2020-06-27 DIAGNOSIS — C3490 Malignant neoplasm of unspecified part of unspecified bronchus or lung: Secondary | ICD-10-CM | POA: Diagnosis not present

## 2020-06-27 DIAGNOSIS — J984 Other disorders of lung: Secondary | ICD-10-CM | POA: Diagnosis not present

## 2020-06-27 NOTE — Progress Notes (Signed)
[address]       [place] [address]             [phone]     HPI: Teresa Booker returns for scheduled follow-up visit  Teresa Booker is a 75 year old woman with a history of remote tobacco abuse, fibromyalgia, reflux, hyperlipidemia, irritable bowel syndrome, and anxiety.  She had a CT for coronary calcium score in February 2021 and was found to have a lung nodule.  On follow-up that had increased in size.  It was hypermetabolic on PET.  I did a robotic right lower lobe superior segmentectomy on 10/25/2019.  The tumor turned out to be a T1, N0, stage Ia adenocarcinoma.  She saw Dr. Julien Nordmann in follow-up.  She is currently under observation.  I last saw her in October.  She was doing well at that time.  In the interim since her last visit she is continue to do well.  She is not taking any pain medication.  She does have 1 spot along the costal margin that she says is both numb and sensitive to touch.  Other than that she feels well.  Past Medical History:  Diagnosis Date  . Alcohol abuse    - abstemious 20 years. Attends AA - strong recovery   . Allergic reaction   . Allergy    seasonal  . Anxiety   . COPD (chronic obstructive pulmonary disease) (HCC)    minor  . Family history of adverse reaction to anesthesia    mother had problems with n/v  . Fibromyalgia    slight  . GERD (gastroesophageal reflux disease)   . Hyperlipemia   . Hypertension   . IBS (irritable bowel syndrome)   . NSCL Ca dx'd 10/24/20  . Postnasal drip   . RBBB (right bundle branch block)   . Rotator cuff syndrome    right    Current Outpatient Medications  Medication Sig Dispense Refill  . aspirin EC 81 MG tablet Take 81 mg by mouth daily. Swallow whole.    . Calcium 200 MG TABS Take 200 mg by mouth every evening.     . hydrOXYzine (ATARAX/VISTARIL) 25 MG tablet TAKE 1 TABLET BY MOUTH EVERY 8 HOURS AS NEEDED 90 tablet 0  . hyoscyamine (LEVBID) 0.375 MG 12 hr tablet Take 1 tablet (0.375  mg total) by mouth 2 (two) times daily. 180 tablet 0  . L-Lysine 500 MG TABS Take 500 mg by mouth daily.    Marland Kitchen loratadine (CLARITIN) 10 MG tablet Take 1 tablet (10 mg total) by mouth daily. 90 tablet 3  . Magnesium 250 MG TABS Take 250 mg by mouth every evening.     . metoprolol succinate (TOPROL-XL) 50 MG 24 hr tablet TAKE 1 TABLET BY MOUTH ONCE DAILY WITH A MEAL OR  IMMEDIATELY  FOLLOWING  A  MEAL (Patient taking differently: Take 50 mg by mouth daily.) 90 tablet 3  . Multiple Vitamin (MULTIVITAMIN WITH MINERALS) TABS tablet Take 1 tablet by mouth daily.    Marland Kitchen omeprazole (PRILOSEC) 20 MG capsule Take 20 mg by mouth daily before breakfast.    . rosuvastatin (CRESTOR) 10 MG tablet TAKE 1 TABLET BY MOUTH AT BEDTIME 90 tablet 3  . nitrofurantoin, macrocrystal-monohydrate, (MACROBID) 100 MG capsule Take 1 capsule (100 mg total) by mouth 2 (two) times daily. (Patient not taking: Reported on 06/27/2020) 10 capsule 0   No current facility-administered medications for this visit.    Physical Exam BP  117/64   Pulse 74   Resp 20   Ht 5\' 5"  (1.651 m)   Wt 138 lb (62.6 kg)   SpO2 96% Comment: RA  BMI 22.59 kg/m  75 year old woman in no acute distress Alert and oriented x3 with no focal deficits Lungs clear with equal breath sounds bilaterally Incisions well-healed No cervical or supraclavicular adenopathy  Diagnostic Tests: CHEST - 2 VIEW  COMPARISON:  Chest radiograph December 07, 2019; chest CT June 05, 2020  FINDINGS: Scarring right mid lung appears essentially stable. There is also slight scarring in the right apex region medially. Lungs elsewhere clear. Heart size and pulmonary vascularity are normal. No adenopathy. No bone lesions. There are foci of calcification in each carotid artery.  IMPRESSION: Scarring right mid lung. Slight scarring medial right apex. No new opacity. No edema or consolidation. Heart size normal. No adenopathy.   Electronically Signed   By: Lowella Grip III M.D.   On: 06/27/2020 11:08  Impression: Teresa Booker is a 75 year old woman with a history of remote tobacco abuse fibromyalgia, reflux, hyperlipidemia, irritable bowel syndrome, anxiety, and a stage Ia adenocarcinoma of the lung status post right lower lobe superior segmentectomy.  She is now about 8 months out from surgery.  She is doing well.  She has 1 area that is get a little bit of paresthesias but is not bothering her significantly.   Plan: Continue to follow-up with Dr. Julien Nordmann I will be happy to see Teresa Booker back anytime in the future if I can be of any further assistance with her care  [name], MD [facility] [phone]

## 2020-07-02 ENCOUNTER — Other Ambulatory Visit: Payer: Self-pay | Admitting: Family Medicine

## 2020-07-19 ENCOUNTER — Ambulatory Visit: Payer: Medicare HMO | Admitting: Physician Assistant

## 2020-07-19 ENCOUNTER — Encounter: Payer: Self-pay | Admitting: Physician Assistant

## 2020-07-19 VITALS — BP 114/56 | HR 80 | Ht 65.0 in | Wt 139.4 lb

## 2020-07-19 DIAGNOSIS — Z86010 Personal history of colonic polyps: Secondary | ICD-10-CM

## 2020-07-19 DIAGNOSIS — Z85118 Personal history of other malignant neoplasm of bronchus and lung: Secondary | ICD-10-CM

## 2020-07-19 DIAGNOSIS — K5909 Other constipation: Secondary | ICD-10-CM

## 2020-07-19 MED ORDER — PLENVU 140 G PO SOLR: 1.0000 | ORAL | 0 refills | Status: DC

## 2020-07-19 NOTE — Patient Instructions (Signed)
If you are age 73 or older, your body mass index should be between 23-30. Your Body mass index is 23.19 kg/m. If this is out of the aforementioned range listed, please consider follow up with your Primary Care Provider. __________________________________________________________  The [facility] providers would like to encourage you to use [facility] to communicate with providers for non-urgent requests or questions.  Due to long hold times on the telephone, sending your provider a message by [facility] may be a faster and more efficient way to get a response.  Please allow 48 business hours for a response.  Please remember that this is for non-urgent requests.   You have been scheduled for a colonoscopy. Please follow written instructions given to you at your visit today.  Please pick up your prep supplies at the pharmacy within the next 1-3 days. If you use inhalers (even only as needed), please bring them with you on the day of your procedure.  Continue to increase your water intake and daily prunes.  Use Miralax 1 capful in 8 ounces of water or juice daily as needed for constipation.  Follow up pending.  Thank you for entrusting me with your care and choosing [facility].  Amy Esterwood, PC-A

## 2020-07-19 NOTE — Progress Notes (Signed)
Subjective:    Patient ID: Teresa Booker, female    DOB: [birth year], 75 y.o.   MRN: [id number]  HPI Teresa Booker is a pleasant 75 year old white female, established with Dr. Silverio Decamp who comes in today to discuss follow-up colonoscopy. Patient last had colonoscopy in November 2016 with finding of hypertrophied anal papillae which were biopsied and found to be benign anal squamous epithelium, she also had 2 sessile polyps removed measuring 5 to 9 mm, and internal hemorrhoids.  Path on the polyps consistent with tubular adenomas. Patient was initially indicated for 5-year interval follow-up and then recently had received notification that she would be indicated for 7-year follow-up/November 2023.  Patient was recently diagnosed with stage I adenocarcinoma of the lung and underwent robotic assisted thoracoscopy and segmentectomy of the right lower lobe in September 2021.  She says she is doing well postoperatively.  She is very concerned as her lung cancer was diagnosed incidentally by imaging for other issues and she was not having any symptoms.  She is concerned about waiting on follow-up colonoscopy. History is also pertinent for right bundle branch block, and hyperlipidemia. She has history of chronic significant constipation dating back many years.  She usually manages this well with eating prunes on a daily basis, and intermittent use of suppositories.  She has not required prescription management and has not tried MiraLAX.  She is not aware of any melena or hematochezia  Review of Systems Pertinent positive and negative review of systems were noted in the above HPI section.  All other review of systems was otherwise negative.  Outpatient Encounter Medications as of 07/19/2020  Medication Sig  . aspirin EC 81 MG tablet Take 81 mg by mouth daily. Swallow whole.  . Calcium 200 MG TABS Take 200 mg by mouth every evening.   . hydrOXYzine (ATARAX/VISTARIL) 25 MG tablet TAKE 1 TABLET BY MOUTH EVERY 8 HOURS AS  NEEDED  . hyoscyamine (LEVBID) 0.375 MG 12 hr tablet Take 1 tablet (0.375 mg total) by mouth 2 (two) times daily.  Marland Kitchen L-Lysine 500 MG TABS Take 500 mg by mouth daily.  Marland Kitchen loratadine (CLARITIN) 10 MG tablet Take 1 tablet (10 mg total) by mouth daily.  . Magnesium 250 MG TABS Take 250 mg by mouth every evening.   . metoprolol succinate (TOPROL-XL) 50 MG 24 hr tablet TAKE 1 TABLET BY MOUTH ONCE DAILY WITH A MEAL OR  IMMEDIATELY  FOLLOWING  A  MEAL  . Multiple Vitamin (MULTIVITAMIN WITH MINERALS) TABS tablet Take 1 tablet by mouth daily.  Marland Kitchen omeprazole (PRILOSEC) 20 MG capsule Take 20 mg by mouth daily before breakfast.  . PEG-KCl-NaCl-NaSulf-Na Asc-C (PLENVU) 140 g SOLR Take 1 kit by mouth as directed.  . rosuvastatin (CRESTOR) 10 MG tablet TAKE 1 TABLET BY MOUTH AT BEDTIME  . [DISCONTINUED] nitrofurantoin, macrocrystal-monohydrate, (MACROBID) 100 MG capsule Take 1 capsule (100 mg total) by mouth 2 (two) times daily. (Patient not taking: Reported on 07/19/2020)   No facility-administered encounter medications on file as of 07/19/2020.   Allergies  Allergen Reactions  . Codeine     Makes heart race  . Penicillins     unknown  . Sulfonamide Derivatives     Has a rash with the use of silvadiene for burn  . Aspirin     Upset stomach, High dose only   Patient Active Problem List   Diagnosis Date Noted  . Hx of adenomatous colonic polyps 07/19/2020  . Adenocarcinoma of lung, stage 1, right ([place]) 10/28/2019  .  S/P robot-assisted surgical procedure 10/25/2019  . Hyperlipidemia 08/31/2014  . Elevated BP 11/18/2012  . Abdominal pain, RUQ 08/14/2010  . ABDOMINAL BRUIT 06/27/2008  . RIGHT BUNDLE BRANCH BLOCK 06/24/2008  . Allergic rhinitis 09/09/2007  . IBS 08/13/2007  . ROTATOR CUFF SYNDROME, RIGHT 08/13/2007  . Elevated lipids 06/24/2007   Social History   Socioeconomic History  . Marital status: Single    Spouse name: Not on file  . Number of children: 0  . Years of education: 16  .  Highest education level: Bachelor's degree (e.g., BA, AB, BS)  Occupational History  . Occupation: Freight forwarder    Comment: retired  Tobacco Use  . Smoking status: Former Smoker    Packs/day: 1.00    Years: 25.00    Pack years: 25.00    Types: Cigarettes    Quit date: 03/12/1996    Years since quitting: 24.3  . Smokeless tobacco: Never Used  . Tobacco comment: quit appox 10 yrs ago  Vaping Use  . Vaping Use: Never used  Substance and Sexual Activity  . Alcohol use: No    Alcohol/week: 0.0 standard drinks    Comment: former usuer AA 20+yrs ago strong recovery  . Drug use: No  . Sexual activity: Yes    Partners: Female  Other Topics Concern  . Not on file  Social History Narrative   03/30/19:   [employer]   Long-term relationship - '95   1 cat   Social Determinants of Health   Financial Resource Strain: Low Risk   . Difficulty of Paying Living Expenses: Not hard at all  Food Insecurity: No Food Insecurity  . Worried About Charity fundraiser in the Last Year: Never true  . Ran Out of Food in the Last Year: Never true  Transportation Needs: No Transportation Needs  . Lack of Transportation (Medical): No  . Lack of Transportation (Non-Medical): No  Physical Activity: Insufficiently Active  . Days of Exercise per Week: 5 days  . Minutes of Exercise per Session: 20 min  Stress: No Stress Concern Present  . Feeling of Stress : Not at all  Social Connections: Moderately Integrated  . Frequency of Communication with Friends and Family: More than three times a week  . Frequency of Social Gatherings with Friends and Family: Never  . Attends Religious Services: Never  . Active Member of Clubs or Organizations: Yes  . Attends Archivist Meetings: Never  . Marital Status: Living with partner  Intimate Partner Violence: Not At Risk  . Fear of Current or Ex-Partner: No  . Emotionally Abused: No  . Physically Abused: No  . Sexually Abused: No    Ms.  Booker family history includes Aneurysm in her mother and another family member; Brain cancer in her brother; Cancer in her brother; Coronary artery disease in her mother; Diabetes in her brother; Heart attack in her mother; Hyperlipidemia in her mother; Irritable bowel syndrome in her mother; Kidney cancer in her brother; Lung cancer in her brother and another family member; Stroke in her father and another family member.      Objective:    Vitals:   07/19/20 1333  BP: (!) 114/56  Pulse: 80  SpO2: 96%    Physical Exam Well-developed well-nourished older Wf  in no acute distress.  Height, Weight,139  BMI 23.1  HEENT; nontraumatic normocephalic, EOMI, PE R LA, sclera anicteric. Oropharynx; not done Neck; supple, no JVD Cardiovascular; regular rate and rhythm with  S1-S2, no murmur rub or gallop Pulmonary; Clear bilaterally, incisional scar right lower posterior chest  Abdomen; soft, nontender, nondistended, no palpable mass or hepatosplenomegaly, bowel sounds are active Rectal;not done today Skin; benign exam, no jaundice rash or appreciable lesions Extremities; no clubbing cyanosis or edema skin warm and dry Neuro/Psych; alert and oriented x4, grossly nonfocal mood and affect appropriate       Assessment & Plan:   #23 75 year old white female with personal history of tubular adenomatous colon polyps at colonoscopy November 2016, 2 polyps removed 5 to 9 mm in size. Initial indication for 5-year interval follow-up, then changed to 7-year interval follow-up.  Patient not comfortable with follow-up interval given her recent diagnosis of adenocarcinoma of the lung  #2 chronic constipation #3 recent diagnosis of stage I adenocarcinoma of the lung status post right lower lobe segmentectomy September 2021-found incidentally on imaging for other issues #4 hepatic steatosis by prior imaging  Plan; patient will be scheduled for colonoscopy with Dr. Silverio Decamp   Procedure was discussed in  detail with the patient including indications risks and benefits and she is agreeable to proceed. For constipation we discussed liberal water intake at least 60 to 70 ounces per day.  She will continue eating prunes on a daily basis and discussed use of MiraLAX 17 g in 8 ounces of water on a as needed basis as needed.    Teresa Booker S Pamila Mendibles PA-C 07/19/2020   Cc: [name], MD

## 2020-07-20 NOTE — Progress Notes (Signed)
Reviewed and agree with documentation and assessment and plan. K. Veena Gwenneth Whiteman , MD   

## 2020-07-25 NOTE — Progress Notes (Signed)
Cardiology Office Note    Date:  07/25/2020   ID:  Teresa, Booker December 30, 1945, MRN [id number]  PCP:  Teresa Post, MD  Cardiologist: Teresa Rouge, MD    History of Present Illness:   75 y.o. significant partner to our nurse Teresa Booker. History of HLD, palpitations with benign PVCls Rx beta blocker. Normal EF and mild MVP no significant MR Prednisone and caffeine make her palpitations worse   Calcium score 04/05/19 109 which is 21 st percentile for age and sex isolated to LAD On crestor with LDL at goal 63 01/14/20 This showed nodule in superior segment of RLL   She eventually had robotic RLL segmentectomy by Dr Teresa Booker for T1N0 sage 1A adenocarcinoma  No cardiac symptoms Some paresthesias over surgical incision from thoracotomy   Past Medical History:  Diagnosis Date   Alcohol abuse    - abstemious 20 years. Attends AA - strong recovery    Allergic reaction    Allergy    seasonal   Anxiety    COPD (chronic obstructive pulmonary disease) (HCC)    minor   Family history of adverse reaction to anesthesia    mother had problems with n/v   Fibromyalgia    slight   GERD (gastroesophageal reflux disease)    Hyperlipemia    Hypertension    IBS (irritable bowel syndrome)    NSCL Ca dx'd 10/24/20   Postnasal drip    RBBB (right bundle branch block)    Rotator cuff syndrome    right    Past Surgical History:  Procedure Laterality Date   COLONOSCOPY     INTERCOSTAL NERVE BLOCK Right 10/25/2019   Procedure: INTERCOSTAL NERVE BLOCK;  Surgeon: Teresa Nakayama, MD;  Location: [facility] OR;  Service: Thoracic;  Laterality: Right;   MOUTH SURGERY  2002   NODE DISSECTION Right 10/25/2019   Procedure: NODE DISSECTION;  Surgeon: Teresa Nakayama, MD;  Location: [facility];  Service: Thoracic;  Laterality: Right;   ROTATOR CUFF REPAIR  10-24-05   had bone spur and laceration off cuff. Daldorf   XI ROBOTIC ASSISTED THORACOSCOPY- SEGMENTECTOMY Right 10/25/2019   Procedure: XI  ROBOTIC ASSISTED THORACOSCOPY-SUPERIOR SEGMENTECTOMY;  Surgeon: Teresa Nakayama, MD;  Location: [facility] OR;  Service: Thoracic;  Laterality: Right;    Current Medications: No outpatient medications have been marked as taking for the 08/01/20 encounter (Appointment) with Teresa Hector, MD.     Allergies:   Codeine, Penicillins, Sulfonamide derivatives, and Aspirin   Social History   Socioeconomic History   Marital status: Single    Spouse name: Not on file   Number of children: 0   Years of education: 16   Highest education level: Bachelor's degree (e.g., BA, AB, BS)  Occupational History   Occupation: Freight forwarder    Comment: retired  Tobacco Use   Smoking status: Former    Packs/day: 1.00    Years: 25.00    Pack years: 25.00    Types: Cigarettes    Quit date: 03/12/1996    Years since quitting: 24.3   Smokeless tobacco: Never   Tobacco comments:    quit appox 10 yrs ago  Vaping Use   Vaping Use: Never used  Substance and Sexual Activity   Alcohol use: No    Alcohol/week: 0.0 standard drinks    Comment: former usuer AA 20+yrs ago strong recovery   Drug use: No   Sexual activity: Yes    Partners: Female  Other Topics Concern  Not on file  Social History Narrative   03/30/19:   [employer]   Long-term relationship - '95   1 cat   Social Determinants of Health   Financial Resource Strain: Low Risk    Difficulty of Paying Living Expenses: Not hard at all  Food Insecurity: No Food Insecurity   Worried About Charity fundraiser in the Last Year: Never true   Arboriculturist in the Last Year: Never true  Transportation Needs: No Transportation Needs   Lack of Transportation (Medical): No   Lack of Transportation (Non-Medical): No  Physical Activity: Insufficiently Active   Days of Exercise per Week: 5 days   Minutes of Exercise per Session: 20 min  Stress: No Stress Concern Present   Feeling of Stress : Not at all  Social Connections: Moderately  Integrated   Frequency of Communication with Friends and Family: More than three times a week   Frequency of Social Gatherings with Friends and Family: Never   Attends Religious Services: Never   Marine scientist or Organizations: Yes   Attends Music therapist: Never   Marital Status: Living with partner     Family History:  The patient's   family history includes Aneurysm in her mother and another family member; Brain cancer in her brother; Cancer in her brother; Coronary artery disease in her mother; Diabetes in her brother; Heart attack in her mother; Hyperlipidemia in her mother; Irritable bowel syndrome in her mother; Kidney cancer in her brother; Lung cancer in her brother and another family member; Stroke in her father and another family member.   ROS:   Please see the history of present illness.    ROS All other systems reviewed and are negative.   PHYSICAL EXAM:   VS:  There were no vitals taken for this visit.   Affect appropriate Healthy:  appears stated age 39: normal Neck supple with no adenopathy JVP normal no bruits no thyromegaly Lungs clear Booker right thoracotomy robotic  Heart:  S1/S2 no murmur, no rub, gallop or click PMI normal Abdomen: benighn, BS positve, no tenderness, no AAA no bruit.  No HSM or HJR Distal pulses intact with no bruits No edema Neuro non-focal Skin warm and dry No muscular weakness  Wt Readings from Last 3 Encounters:  07/19/20 63.2 kg  06/27/20 62.6 kg  06/12/20 64.3 kg      Studies/Labs Reviewed:   EKG:   08/25/18 NSR rate 76 nonspecific ST changes with frequent PVC's  Recent Labs: 06/05/2020: ALT 20; BUN 13; Creatinine 0.95; Hemoglobin 12.1; Platelet Count 230; Potassium 4.3; Sodium 143   Lipid Panel    Component Value Date/Time   CHOL 145 01/14/2020 0837   TRIG 101 01/14/2020 0837   HDL 64 01/14/2020 0837   CHOLHDL 2.3 01/14/2020 0837   CHOLHDL 2.7 10/05/2015 0758   VLDL 28 10/05/2015 0758    LDLCALC 63 01/14/2020 0837    Additional studies/ records that were reviewed today include:  NST 05/06/2017  Nuclear stress EF: 70%. There was no ST segment deviation noted during stress. Blood pressure demonstrated a hypertensive response to exercise. The study is normal. This is a low risk study. The left ventricular ejection fraction is hyperdynamic (>65%).   Normal exercise nuclear stress test with no evidence for prior infarct or ischemia.  Normal LVEF. Poor exercise capacity. Hypertensive response to exercise.   2D echo 3/26/2019Study Conclusions   - Left ventricle: The cavity  size was normal. Systolic function was   normal. The estimated ejection fraction was in the range of 60%   to 65%. Wall motion was normal; there were no regional wall   motion abnormalities. Left ventricular diastolic function   parameters were normal. - Mitral valve: Mild, late systolicprolapse, involving the anterior   leaflet. There was no significant regurgitation. - Pulmonary arteries: PA peak pressure: 32 mm Hg (S).   Holter monitor 04/2017 Sinus rhythm PAC;s/ PVC;s No significant arrhythmias       ASSESSMENT:    Palpitations, HLD, Pulmonary Nodule   PLAN:  In order of problems listed above:   Palpitations Benign in setting of no obstructive CAD normal EF RX beta blockers with improvement Limit Caffeine   Hyperlipidemia on Crestor LDL  63 at goal  Pulmonary: F/U Dr Valeta Harms and Teresa Booker f/u CXR  06/27/20 some scarring in right middle lobe no Booker segmental lobectomy on right for adenocarcinoma stage 1AN0   F/U with me in a year   Signed, Teresa Rouge, MD  07/25/2020 12:27 PM    [facility] [address], [place], [place]  [address] Phone: [phone]; Fax: [phone]

## 2020-08-01 ENCOUNTER — Other Ambulatory Visit: Payer: Self-pay

## 2020-08-01 ENCOUNTER — Ambulatory Visit (INDEPENDENT_AMBULATORY_CARE_PROVIDER_SITE_OTHER): Payer: Medicare HMO | Admitting: Cardiovascular Disease

## 2020-08-01 ENCOUNTER — Encounter: Payer: Self-pay | Admitting: Cardiovascular Disease

## 2020-08-01 VITALS — BP 110/56 | HR 70 | Ht 66.0 in | Wt 135.0 lb

## 2020-08-01 DIAGNOSIS — E782 Mixed hyperlipidemia: Secondary | ICD-10-CM | POA: Diagnosis not present

## 2020-08-01 DIAGNOSIS — R002 Palpitations: Secondary | ICD-10-CM

## 2020-08-01 DIAGNOSIS — C3491 Malignant neoplasm of unspecified part of right bronchus or lung: Secondary | ICD-10-CM

## 2020-08-01 NOTE — Patient Instructions (Signed)
Medication Instructions:  *If you need a refill on your cardiac medications before your next appointment, please call your pharmacy*   Lab Work: If you have labs (blood work) drawn today and your tests are completely normal, you will receive your results only by: [facility] (if you have MyChart) OR A paper copy in the mail If you have any lab test that is abnormal or we need to change your treatment, we will call you to review the results.   Testing/Procedures: None needed at this time.   Follow-Up: At [facility], you and your health needs are our priority.  As part of our continuing mission to provide you with exceptional heart care, we have created designated Provider Care Teams.  These Care Teams include your primary Cardiologist (physician) and Advanced Practice Providers (APPs -  Physician Assistants and Nurse Practitioners) who all work together to provide you with the care you need, when you need it.  We recommend signing up for the patient portal called "MyChart".  Sign up information is provided on this After Visit Summary.  MyChart is used to connect with patients for Virtual Visits (Telemedicine).  Patients are able to view lab/test results, encounter notes, upcoming appointments, etc.  Non-urgent messages can be sent to your provider as well.   To learn more about what you can do with MyChart, go to [email or web address].    Your next appointment:   12 month(s)  The format for your next appointment:   In Person  Provider:   You may see Jenkins Rouge, MD or one of the following Advanced Practice Providers on your designated Care Team:   Kathyrn Drown, NP

## 2020-08-02 ENCOUNTER — Other Ambulatory Visit: Payer: Self-pay

## 2020-08-02 DIAGNOSIS — E785 Hyperlipidemia, unspecified: Secondary | ICD-10-CM

## 2020-08-02 NOTE — Progress Notes (Signed)
Per lab results on 01/14/20, patient needs lipid and liver panel in 6 months.Order placed and patient lab appointment made.

## 2020-08-07 ENCOUNTER — Other Ambulatory Visit: Payer: Self-pay

## 2020-08-07 ENCOUNTER — Other Ambulatory Visit: Payer: Medicare HMO | Admitting: *Deleted

## 2020-08-07 DIAGNOSIS — E785 Hyperlipidemia, unspecified: Secondary | ICD-10-CM

## 2020-08-07 LAB — LIPID PANEL
Chol/HDL Ratio: 2.4 ratio (ref 0.0–4.4)
Cholesterol, Total: 132 mg/dL (ref 100–199)
HDL: 54 mg/dL (ref >39)
LDL Chol Calc (NIH): 57 mg/dL (ref 0–99)
Triglycerides: 118 mg/dL (ref 0–149)
VLDL Cholesterol Cal: 21 mg/dL (ref 5–40)

## 2020-08-07 LAB — HEPATIC FUNCTION PANEL
ALT: 25 IU/L (ref 0–32)
AST: 27 IU/L (ref 0–40)
Albumin: 4.8 g/dL — ABNORMAL HIGH (ref 3.7–4.7)
Alkaline Phosphatase: 72 IU/L (ref 44–121)
Bilirubin Total: 0.3 mg/dL (ref 0.0–1.2)
Bilirubin, Direct: 0.11 mg/dL (ref 0.00–0.40)
Total Protein: 7.1 g/dL (ref 6.0–8.5)

## 2020-08-10 ENCOUNTER — Other Ambulatory Visit: Payer: Self-pay | Admitting: Obstetrics & Gynecology

## 2020-08-10 DIAGNOSIS — Z1231 Encounter for screening mammogram for malignant neoplasm of breast: Secondary | ICD-10-CM

## 2020-08-11 ENCOUNTER — Telehealth: Payer: Self-pay

## 2020-08-11 DIAGNOSIS — E782 Mixed hyperlipidemia: Secondary | ICD-10-CM

## 2020-08-11 NOTE — Telephone Encounter (Signed)
-----   Message from Josue Hector, MD sent at 08/10/2020  6:17 PM EDT ----- Cholesterol is at goal and LFT's are normal F/U labs in 6 months

## 2020-08-11 NOTE — Telephone Encounter (Signed)
The patient has been notified of the result and verbalized understanding.  Patient scheduled for lab work on 02/14/21. All questions (if any) were answered. Michaelyn Barter, RN 08/11/2020 9:25 AM

## 2020-09-14 ENCOUNTER — Other Ambulatory Visit: Payer: Self-pay | Admitting: Cardiovascular Disease

## 2020-09-25 ENCOUNTER — Other Ambulatory Visit: Payer: Self-pay | Admitting: Family Medicine

## 2020-10-04 ENCOUNTER — Encounter: Payer: Self-pay | Admitting: Family Medicine

## 2020-10-04 ENCOUNTER — Ambulatory Visit
Admission: RE | Admit: 2020-10-04 | Discharge: 2020-10-04 | Disposition: A | Discharge status: Home / Self Care | Payer: Medicare HMO | Source: Ambulatory Visit | Attending: Obstetrics & Gynecology | Admitting: Obstetrics & Gynecology

## 2020-10-04 ENCOUNTER — Other Ambulatory Visit: Payer: Self-pay

## 2020-10-04 DIAGNOSIS — Z1231 Encounter for screening mammogram for malignant neoplasm of breast: Secondary | ICD-10-CM | POA: Diagnosis not present

## 2020-10-18 ENCOUNTER — Encounter: Payer: Self-pay | Admitting: Certified Registered Nurse Anesthetist

## 2020-10-19 ENCOUNTER — Other Ambulatory Visit: Payer: Self-pay

## 2020-10-19 ENCOUNTER — Encounter: Payer: Self-pay | Admitting: Gastroenterology

## 2020-10-19 ENCOUNTER — Ambulatory Visit (AMBULATORY_SURGERY_CENTER): Payer: Medicare HMO | Admitting: Gastroenterology

## 2020-10-19 VITALS — BP 139/66 | HR 78 | Temp 97.3°F | Resp 12 | Ht 65.0 in | Wt 139.0 lb

## 2020-10-19 DIAGNOSIS — K5909 Other constipation: Secondary | ICD-10-CM | POA: Diagnosis not present

## 2020-10-19 DIAGNOSIS — R69 Illness, unspecified: Secondary | ICD-10-CM | POA: Diagnosis not present

## 2020-10-19 DIAGNOSIS — D123 Benign neoplasm of transverse colon: Secondary | ICD-10-CM

## 2020-10-19 DIAGNOSIS — K635 Polyp of colon: Secondary | ICD-10-CM

## 2020-10-19 DIAGNOSIS — M797 Fibromyalgia: Secondary | ICD-10-CM | POA: Diagnosis not present

## 2020-10-19 DIAGNOSIS — Z86010 Personal history of colonic polyps: Secondary | ICD-10-CM

## 2020-10-19 DIAGNOSIS — J449 Chronic obstructive pulmonary disease, unspecified: Secondary | ICD-10-CM | POA: Diagnosis not present

## 2020-10-19 MED ORDER — SODIUM CHLORIDE 0.9 % IV SOLN: 500.0000 mL | Freq: Once | INTRAVENOUS | Status: DC

## 2020-10-19 NOTE — Progress Notes (Signed)
[facility] History and Physical   Primary Care Physician:  [name], MD   Reason for Procedure:  History of adenomatous colon polyps  Plan:    Surveillance colonoscopy with possible interventions as needed     HPI: Teresa Booker is a very pleasant 75 y.o. female here for surveillance colonoscopy. Denies any nausea, vomiting, abdominal pain, melena or bright red blood per rectum  Incidental constipation  The risks and benefits as well as alternatives of endoscopic procedure(s) have been discussed and reviewed. All questions answered. The patient agrees to proceed.  Please refer to office visit note 07/19/20 for additional details   Past Medical History:  Diagnosis Date   Alcohol abuse    - abstemious 20 years. Attends AA - strong recovery    Allergic reaction    Allergy    seasonal   Anxiety    COPD (chronic obstructive pulmonary disease) (HCC)    minor   Family history of adverse reaction to anesthesia    mother had problems with n/v   Fibromyalgia    slight   GERD (gastroesophageal reflux disease)    Hyperlipemia    Hypertension    IBS (irritable bowel syndrome)    NSCL Ca dx'd 10/24/20   Postnasal drip    RBBB (right bundle branch block)    Rotator cuff syndrome    right    Past Surgical History:  Procedure Laterality Date   COLONOSCOPY     INTERCOSTAL NERVE BLOCK Right 10/25/2019   Procedure: INTERCOSTAL NERVE BLOCK;  Surgeon: [name], MD;  Location: [facility] OR;  Service: Thoracic;  Laterality: Right;   MOUTH SURGERY  2002   NODE DISSECTION Right 10/25/2019   Procedure: NODE DISSECTION;  Surgeon: [name], MD;  Location: [place];  Service: Thoracic;  Laterality: Right;   ROTATOR CUFF REPAIR  10-24-05   had bone spur and laceration off cuff. [place]   XI ROBOTIC ASSISTED THORACOSCOPY- SEGMENTECTOMY Right 10/25/2019   Procedure: XI ROBOTIC ASSISTED THORACOSCOPY-SUPERIOR SEGMENTECTOMY;  Surgeon: [name], MD;   Location: [place];  Service: Thoracic;  Laterality: Right;    Prior to Admission medications   Medication Sig Start Date End Date Taking? Authorizing Provider  aspirin EC 81 MG tablet Take 81 mg by mouth daily. Swallow whole.   Yes [provider]  Calcium 200 MG TABS Take 200 mg by mouth every evening.    Yes [provider]  CHROMIUM PO Take 1 tablet by mouth daily.   Yes [provider]  hydrOXYzine (ATARAX/VISTARIL) 25 MG tablet TAKE 1 TABLET BY MOUTH EVERY 8 HOURS AS NEEDED 09/26/20  Yes Burchette, Alinda Sierras, MD  hyoscyamine (LEVBID) 0.375 MG 12 hr tablet TAKE ONE TABLET BY MOUTH TWICE A DAY 09/26/20  Yes Burchette, Alinda Sierras, MD  L-Lysine 500 MG TABS Take 500 mg by mouth daily.   Yes [provider]  loratadine (CLARITIN) 10 MG tablet Take 1 tablet (10 mg total) by mouth daily. 01/23/11  Yes Norins, Heinz Knuckles, MD  Magnesium 250 MG TABS Take 250 mg by mouth every evening.    Yes [provider]  MELATONIN ER PO Take 1 tablet by mouth at bedtime.   Yes [provider]  metoprolol succinate (TOPROL-XL) 50 MG 24 hr tablet TAKE 1 TABLET BY MOUTH ONCE DAILY WITH A MEAL OR  IMMEDIATELY  FOLLOWING  A  MEAL 09/14/20  Yes Josue Hector, MD  Multiple Vitamin (MULTIVITAMIN WITH MINERALS) TABS tablet  Take 1 tablet by mouth daily.   Yes [provider]  omeprazole (PRILOSEC) 20 MG capsule Take 20 mg by mouth daily before breakfast.   Yes [provider]  rosuvastatin (CRESTOR) 10 MG tablet TAKE 1 TABLET BY MOUTH AT BEDTIME 03/21/20  Yes Josue Hector, MD  PEG-KCl-NaCl-NaSulf-Na Asc-C (PLENVU) 140 g SOLR Take 1 kit by mouth as directed. 07/19/20   Esterwood, Amy S, PA-C    Current Outpatient Medications  Medication Sig Dispense Refill   aspirin EC 81 MG tablet Take 81 mg by mouth daily. Swallow whole.     Calcium 200 MG TABS Take 200 mg by mouth every evening.      CHROMIUM PO Take 1 tablet by mouth daily.     hydrOXYzine  (ATARAX/VISTARIL) 25 MG tablet TAKE 1 TABLET BY MOUTH EVERY 8 HOURS AS NEEDED 90 tablet 0   hyoscyamine (LEVBID) 0.375 MG 12 hr tablet TAKE ONE TABLET BY MOUTH TWICE A DAY 180 tablet 0   L-Lysine 500 MG TABS Take 500 mg by mouth daily.     loratadine (CLARITIN) 10 MG tablet Take 1 tablet (10 mg total) by mouth daily. 90 tablet 3   Magnesium 250 MG TABS Take 250 mg by mouth every evening.      MELATONIN ER PO Take 1 tablet by mouth at bedtime.     metoprolol succinate (TOPROL-XL) 50 MG 24 hr tablet TAKE 1 TABLET BY MOUTH ONCE DAILY WITH A MEAL OR  IMMEDIATELY  FOLLOWING  A  MEAL 90 tablet 3   Multiple Vitamin (MULTIVITAMIN WITH MINERALS) TABS tablet Take 1 tablet by mouth daily.     omeprazole (PRILOSEC) 20 MG capsule Take 20 mg by mouth daily before breakfast.     rosuvastatin (CRESTOR) 10 MG tablet TAKE 1 TABLET BY MOUTH AT BEDTIME 90 tablet 3   PEG-KCl-NaCl-NaSulf-Na Asc-C (PLENVU) 140 g SOLR Take 1 kit by mouth as directed. 1 each 0   Current Facility-Administered Medications  Medication Dose Route Frequency Provider Last Rate Last Admin   0.9 %  sodium chloride infusion  500 mL Intravenous Once Mauri Pole, MD        Allergies as of 10/19/2020 - Review Complete 10/19/2020  Allergen Reaction Noted   Codeine     Penicillins     Sulfonamide derivatives     Aspirin      Family History  Problem Relation Age of Onset   Hyperlipidemia Mother    Irritable bowel syndrome Mother    Coronary artery disease Mother    Heart attack Mother    Aneurysm Mother        brain   Diabetes Brother    Cancer Brother        small cell lung ca with mets/ smoker   Brain cancer Brother    Lung cancer Brother    Stroke Father    Stroke Other    Aneurysm Other    Lung cancer Other    Kidney cancer Brother    Colon cancer Neg Hx    Esophageal cancer Neg Hx    Pancreatic cancer Neg Hx    Stomach cancer Neg Hx     Social History   Socioeconomic History   Marital status: Single     Spouse name: Not on file   Number of children: 0   Years of education: 16   Highest education level: Bachelor's degree (e.g., BA, AB, BS)  Occupational History   Occupation: Freight forwarder  Comment: retired  Tobacco Use   Smoking status: Former    Packs/day: 1.00    Years: 25.00    Pack years: 25.00    Types: Cigarettes    Quit date: 03/12/1996    Years since quitting: 24.6   Smokeless tobacco: Never   Tobacco comments:    quit appox 10 yrs ago  Vaping Use   Vaping Use: Never used  Substance and Sexual Activity   Alcohol use: No    Alcohol/week: 0.0 standard drinks    Comment: former [employer]uer AA 20+yrs ago strong recovery   Drug use: No   Sexual activity: Yes    Partners: Female  Other Topics Concern   Not on file  Social History Narrative   03/30/19:   [employer]   Long-term relationship - '95   1 cat   Social Determinants of Health   Financial Resource Strain: Low Risk    Difficulty of Paying Living Expenses: Not hard at all  Food Insecurity: No Food Insecurity   Worried About Charity fundraiser in the Last Year: Never true   [place] in the Last Year: Never true  Transportation Needs: No Transportation Needs   Lack of Transportation (Medical): No   Lack of Transportation (Non-Medical): No  Physical Activity: Insufficiently Active   Days of Exercise per Week: 5 days   Minutes of Exercise per Session: 20 min  Stress: No Stress Concern Present   Feeling of Stress : Not at all  Social Connections: Moderately Integrated   Frequency of Communication with Friends and Family: More than three times a week   Frequency of Social Gatherings with Friends and Family: Never   Attends Religious Services: Never   Marine scientist or Organizations: Yes   Attends Archivist Meetings: Never   Marital Status: Living with partner  Intimate Partner Violence: Not At Risk   Fear of Current or Ex-Partner: No   Emotionally Abused: No   Physically  Abused: No   Sexually Abused: No    Review of Systems:  All other review of systems negative except as mentioned in the HPI.  Physical Exam: Vital signs in last 24 hours: BP (!) 158/74   Pulse 79   Temp (!) 97.3 F (36.3 C) (Skin)   Resp 12   Ht 5' 5"  (1.651 m)   Wt 139 lb (63 kg)   SpO2 97%   BMI 23.13 kg/m     General:   Alert, NAD Lungs:  Clear .   Heart:  Regular rate and rhythm Abdomen:  Soft, nontender and nondistended. Neuro/Psych:  Alert and cooperative. Normal mood and affect. A and O x 3  Reviewed labs, radiology imaging, old records and pertinent past GI work up  Patient is appropriate for planned procedure(s) and anesthesia in an ambulatory setting   K. Denzil Magnuson , MD [phone]

## 2020-10-19 NOTE — Patient Instructions (Signed)
Resume previous diet Continue current medications Await pathology results No repeat colonoscopy due to age.  YOU HAD AN ENDOSCOPIC PROCEDURE TODAY AT [facility]:   Refer to the procedure report that was given to you for any specific questions about what was found during the examination.  If the procedure report does not answer your questions, please call your gastroenterologist to clarify.  If you requested that your care partner not be given the details of your procedure findings, then the procedure report has been included in a sealed envelope for you to review at your convenience later.  YOU SHOULD EXPECT: Some feelings of bloating in the abdomen. Passage of more gas than usual.  Walking can help get rid of the air that was put into your GI tract during the procedure and reduce the bloating. If you had a lower endoscopy (such as a colonoscopy or flexible sigmoidoscopy) you may notice spotting of blood in your stool or on the toilet paper. If you underwent a bowel prep for your procedure, you may not have a normal bowel movement for a few days.  Please Note:  You might notice some irritation and congestion in your nose or some drainage.  This is from the oxygen used during your procedure.  There is no need for concern and it should clear up in a day or so.  SYMPTOMS TO REPORT IMMEDIATELY:  Following lower endoscopy (colonoscopy or flexible sigmoidoscopy):  Excessive amounts of blood in the stool  Significant tenderness or worsening of abdominal pains  Swelling of the abdomen that is new, acute  Fever of 100F or higher  For urgent or emergent issues, a gastroenterologist can be reached at any hour by calling [phone]. Do not use MyChart messaging for urgent concerns.   DIET:  We do recommend a small meal at first, but then you may proceed to your regular diet.  Drink plenty of fluids but you should avoid alcoholic beverages for 24 hours.  ACTIVITY:  You should plan  to take it easy for the rest of today and you should NOT DRIVE or use heavy machinery until tomorrow (because of the sedation medicines used during the test).    FOLLOW UP: Our staff will call the number listed on your records 48-72 hours following your procedure to check on you and address any questions or concerns that you may have regarding the information given to you following your procedure. If we do not reach you, we will leave a message.  We will attempt to reach you two times.  During this call, we will ask if you have developed any symptoms of COVID 19. If you develop any symptoms (ie: fever, flu-like symptoms, shortness of breath, cough etc.) before then, please call [phone].  If you test positive for Covid 19 in the 2 weeks post procedure, please call and report this information to [place].    If any biopsies were taken you will be contacted by phone or by letter within the next 1-3 weeks.  Please call us at [phone] if you have not heard about the biopsies in 3 weeks.   SIGNATURES/CONFIDENTIALITY: You and/or your care partner have signed paperwork which will be entered into your electronic medical record.  These signatures attest to the fact that that the information above on your After Visit Summary has been reviewed and is understood.  Full responsibility of the confidentiality of this discharge information lies with you and/or your care-partner.

## 2020-10-19 NOTE — Op Note (Signed)
[facility] Patient Name: Teresa Booker Procedure Date: 10/19/2020 10:29 AM MRN: [id number] Endoscopist: Mauri Pole , MD Age: 75 Referring MD:  Date of Birth: [birth year] Gender: Female Account #: [id number] Procedure:                Colonoscopy Indications:              High risk colon cancer surveillance: Personal                            history of colonic polyps, High risk colon cancer                            surveillance: Personal history of adenoma less than                            10 mm in size Medicines:                Monitored Anesthesia Care Procedure:                Pre-Anesthesia Assessment:                           - Prior to the procedure, a History and Physical                            was performed, and patient medications and                            allergies were reviewed. The patient's tolerance of                            previous anesthesia was also reviewed. The risks                            and benefits of the procedure and the sedation                            options and risks were discussed with the patient.                            All questions were answered, and informed consent                            was obtained. Prior Anticoagulants: The patient has                            taken no previous anticoagulant or antiplatelet                            agents. ASA Grade Assessment: III - A patient with                            severe systemic disease. After reviewing the risks  and benefits, the patient was deemed in                            satisfactory condition to undergo the procedure.                           After obtaining informed consent, the colonoscope                            was passed under direct vision. Throughout the                            procedure, the patient's blood pressure, pulse, and                            oxygen saturations were monitored  continuously. The                            Olympus PCF-[id number] (#[id number]) Colonoscope was                            introduced through the anus and advanced to the the                            cecum, identified by appendiceal orifice and                            ileocecal valve. The colonoscopy was performed                            without difficulty. The patient tolerated the                            procedure well. The quality of the bowel                            preparation was excellent. Scope In: 10:47:31 AM Scope Out: 11:02:30 AM Scope Withdrawal Time: 0 hours 10 minutes 42 seconds  Total Procedure Duration: 0 hours 14 minutes 59 seconds  Findings:                 The perianal and digital rectal examinations were                            normal.                           A less than 1 mm polyp was found in the transverse                            colon. The polyp was sessile. The polyp was removed                            with a cold biopsy forceps. Resection and retrieval  were complete.                           Non-bleeding external and internal hemorrhoids were                            found during retroflexion. The hemorrhoids were                            medium-sized.                           The exam was otherwise without abnormality. Complications:            No immediate complications. Estimated Blood Loss:     Estimated blood loss was minimal. Impression:               - One less than 1 mm polyp in the transverse colon,                            removed with a cold biopsy forceps. Resected and                            retrieved.                           - Non-bleeding external and internal hemorrhoids.                           - The examination was otherwise normal. Recommendation:           - Patient has a contact number available for                            emergencies. The signs and symptoms of potential                             delayed complications were discussed with the                            patient. Return to normal activities tomorrow.                            Written discharge instructions were provided to the                            patient.                           - Resume previous diet.                           - Continue present medications.                           - Await pathology results.                           -  No repeat colonoscopy due to age.                           - Return to GI clinic PRN. Mauri Pole, MD 10/19/2020 11:14:32 AM This report has been signed electronically.

## 2020-10-19 NOTE — Progress Notes (Signed)
1050 Ephedrine 10 mg given IV due to low BP, MD updated.

## 2020-10-19 NOTE — Progress Notes (Signed)
Report given to PACU, vss 

## 2020-10-19 NOTE — Progress Notes (Signed)
Pt's states no medical or surgical changes since previsit or office visit. VS assessed by C.W 

## 2020-10-19 NOTE — Progress Notes (Deleted)
1050 Ephedrine 10 mg given IV due to low BP, MD updated.

## 2020-10-19 NOTE — Progress Notes (Signed)
Called to room to assist during endoscopic procedure.  Patient ID and intended procedure confirmed with present staff. Received instructions for my participation in the procedure from the performing physician.  

## 2020-10-23 ENCOUNTER — Telehealth: Payer: Self-pay | Admitting: *Deleted

## 2020-10-23 ENCOUNTER — Telehealth: Payer: Self-pay

## 2020-10-23 NOTE — Telephone Encounter (Signed)
No answer for post procedure call back. Left message for patient to call with questions or concerns. 

## 2020-10-23 NOTE — Telephone Encounter (Signed)
Called # [phone] and left a message we tried to reach pt for a follow up call. maw

## 2020-10-24 ENCOUNTER — Encounter: Payer: Self-pay | Admitting: Gastroenterology

## 2020-11-22 ENCOUNTER — Telehealth: Payer: Self-pay

## 2020-11-22 NOTE — Telephone Encounter (Signed)
Last physical exam 11/19/20 AWV 04/2020 Pt notified of above & consents to [place] with PCP on 12/06/20 at 0930.

## 2020-12-05 ENCOUNTER — Other Ambulatory Visit: Payer: Self-pay

## 2020-12-05 ENCOUNTER — Inpatient Hospital Stay: Payer: Medicare HMO | Attending: Internal Medicine

## 2020-12-05 ENCOUNTER — Encounter (HOSPITAL_COMMUNITY): Payer: Self-pay

## 2020-12-05 ENCOUNTER — Ambulatory Visit (HOSPITAL_COMMUNITY)
Admission: RE | Admit: 2020-12-05 | Discharge: 2020-12-05 | Disposition: A | Discharge status: Home / Self Care | Payer: Medicare HMO | Source: Ambulatory Visit | Attending: Internal Medicine | Admitting: Internal Medicine

## 2020-12-05 DIAGNOSIS — C3490 Malignant neoplasm of unspecified part of unspecified bronchus or lung: Secondary | ICD-10-CM | POA: Insufficient documentation

## 2020-12-05 DIAGNOSIS — R918 Other nonspecific abnormal finding of lung field: Secondary | ICD-10-CM | POA: Diagnosis not present

## 2020-12-05 DIAGNOSIS — R911 Solitary pulmonary nodule: Secondary | ICD-10-CM | POA: Diagnosis not present

## 2020-12-05 DIAGNOSIS — Z85118 Personal history of other malignant neoplasm of bronchus and lung: Secondary | ICD-10-CM | POA: Insufficient documentation

## 2020-12-05 DIAGNOSIS — I700 Atherosclerosis of aorta: Secondary | ICD-10-CM | POA: Diagnosis not present

## 2020-12-05 LAB — CMP (CANCER CENTER ONLY)
ALT: 23 U/L (ref 0–44)
AST: 30 U/L (ref 15–41)
Albumin: 4.1 g/dL (ref 3.5–5.0)
Alkaline Phosphatase: 65 U/L (ref 38–126)
Anion gap: 10 (ref 5–15)
BUN: 14 mg/dL (ref 8–23)
CO2: 25 mmol/L (ref 22–32)
Calcium: 9.6 mg/dL (ref 8.9–10.3)
Chloride: 104 mmol/L (ref 98–111)
Creatinine: 0.89 mg/dL (ref 0.44–1.00)
GFR, Estimated: >60 mL/min (ref >60)
Glucose, Bld: 96 mg/dL (ref 70–99)
Potassium: 4.2 mmol/L (ref 3.5–5.1)
Sodium: 139 mmol/L (ref 135–145)
Total Bilirubin: 0.4 mg/dL (ref 0.3–1.2)
Total Protein: 7.2 g/dL (ref 6.5–8.1)

## 2020-12-05 LAB — CBC WITH DIFFERENTIAL (CANCER CENTER ONLY)
Abs Immature Granulocytes: 0.01 10*3/uL (ref 0.00–0.07)
Basophils Absolute: 0 10*3/uL (ref 0.0–0.1)
Basophils Relative: 1 %
Eosinophils Absolute: 0.1 10*3/uL (ref 0.0–0.5)
Eosinophils Relative: 2 %
HCT: 35.7 % — ABNORMAL LOW (ref 36.0–46.0)
Hemoglobin: 11.8 g/dL — ABNORMAL LOW (ref 12.0–15.0)
Immature Granulocytes: 0 %
Lymphocytes Relative: 31 %
Lymphs Abs: 1.9 10*3/uL (ref 0.7–4.0)
MCH: 30.4 pg (ref 26.0–34.0)
MCHC: 33.1 g/dL (ref 30.0–36.0)
MCV: 92 fL (ref 80.0–100.0)
Monocytes Absolute: 0.5 10*3/uL (ref 0.1–1.0)
Monocytes Relative: 8 %
Neutro Abs: 3.6 10*3/uL (ref 1.7–7.7)
Neutrophils Relative %: 58 %
Platelet Count: 228 10*3/uL (ref 150–400)
RBC: 3.88 MIL/uL (ref 3.87–5.11)
RDW: 12.9 % (ref 11.5–15.5)
WBC Count: 6.1 10*3/uL (ref 4.0–10.5)
nRBC: 0 % (ref 0.0–0.2)

## 2020-12-05 MED ORDER — IOHEXOL 350 MG/ML SOLN
60.0000 mL | Freq: Once | INTRAVENOUS | Status: AC | PRN
  Administered 2020-12-05: 60 mL via INTRAVENOUS

## 2020-12-06 ENCOUNTER — Ambulatory Visit (INDEPENDENT_AMBULATORY_CARE_PROVIDER_SITE_OTHER): Payer: Medicare HMO | Admitting: Family Medicine

## 2020-12-06 ENCOUNTER — Encounter: Payer: Self-pay | Admitting: Family Medicine

## 2020-12-06 VITALS — BP 100/44 | HR 75 | Temp 97.9°F | Ht 65.0 in | Wt 133.9 lb

## 2020-12-06 DIAGNOSIS — Z1159 Encounter for screening for other viral diseases: Secondary | ICD-10-CM | POA: Diagnosis not present

## 2020-12-06 DIAGNOSIS — Z23 Encounter for immunization: Secondary | ICD-10-CM | POA: Diagnosis not present

## 2020-12-06 DIAGNOSIS — Z0000 Encounter for general adult medical examination without abnormal findings: Secondary | ICD-10-CM | POA: Diagnosis not present

## 2020-12-06 NOTE — Progress Notes (Signed)
Established Patient Office Visit  Subjective:  Patient ID: Teresa Booker, female    DOB: Jul 06, 1945  Age: 75 y.o. MRN: [id number]  CC:  Chief Complaint  Patient presents with   Annual Exam    HPI Teresa Booker presents for physical exam.  She has history of stage I adenocarcinoma of the right lung with prior right lung surgery for excision of adenocarcinoma mass and is followed by oncology.  She had CT chest recently which is pending.  She had CBC and CMP which were unremarkable.  She has hyperlipidemia treated with rosuvastatin.  Generally doing well.  She retired 6 years ago.  Stays very active.  She has had some modest weight loss.  Her partner is on keto diet and she has been scaling back starches and thinks that her weight loss is related to that.  She has good appetite.  Health maintenance reviewed  -DEXA scan 2020 -Tetanus due 2030 -Colonoscopy due 2027 -Pneumonia vaccines declined.  She apparently had severe local reaction with pneumonia vaccine several years ago -She declines Shingrix vaccine. -No history of hepatitis C screening but low risk.  Social history-retired about 6 years ago.  She works for [employer] and also [employer].  Remote history of smoking.  Quit 1998.  Remote history of alcohol abuse and quit around 1986.  She does have significant other  Family history-mother had coronary disease in her 50s.  Her mother also had history of irritable bowel syndrome hyperlipidemia.  Father had stroke.  She had a brother that died of lung cancer  Past Medical History:  Diagnosis Date   Alcohol abuse    - abstemious 20 years. Attends AA - strong recovery    Allergic reaction    Allergy    seasonal   Anxiety    COPD (chronic obstructive pulmonary disease) (HCC)    minor   Family history of adverse reaction to anesthesia    mother had problems with n/v   Fibromyalgia    slight   GERD (gastroesophageal reflux disease)    Hyperlipemia    Hypertension    IBS  (irritable bowel syndrome)    NSCL Ca dx'd 10/24/20   Postnasal drip    RBBB (right bundle branch block)    Rotator cuff syndrome    right    Past Surgical History:  Procedure Laterality Date   COLONOSCOPY     INTERCOSTAL NERVE BLOCK Right 10/25/2019   Procedure: INTERCOSTAL NERVE BLOCK;  Surgeon: [name], MD;  Location: [facility];  Service: Thoracic;  Laterality: Right;   MOUTH SURGERY  2002   NODE DISSECTION Right 10/25/2019   Procedure: NODE DISSECTION;  Surgeon: [name], MD;  Location: [facility];  Service: Thoracic;  Laterality: Right;   ROTATOR CUFF REPAIR  10-24-05   had bone spur and laceration off cuff. Daldorf   XI ROBOTIC ASSISTED THORACOSCOPY- SEGMENTECTOMY Right 10/25/2019   Procedure: XI ROBOTIC ASSISTED THORACOSCOPY-SUPERIOR SEGMENTECTOMY;  Surgeon: [name], MD;  Location: [facility] OR;  Service: Thoracic;  Laterality: Right;    Family History  Problem Relation Age of Onset   Hyperlipidemia Mother    Irritable bowel syndrome Mother    Coronary artery disease Mother    Heart attack Mother    Aneurysm Mother        brain   Stroke Father    Diabetes Brother    Cancer Brother        small cell lung ca with mets/ smoker  Brain cancer Brother    Lung cancer Brother    Cancer Brother 73       liver cancer ?primary   Kidney cancer Brother    Stroke Other    Aneurysm Other    Lung cancer Other    Colon cancer Neg Hx    Esophageal cancer Neg Hx    Pancreatic cancer Neg Hx    Stomach cancer Neg Hx     Social History   Socioeconomic History   Marital status: Single    Spouse name: Not on file   Number of children: 0   Years of education: 16   Highest education level: Bachelor's degree (e.g., BA, AB, BS)  Occupational History   Occupation: Freight forwarder    Comment: retired  Tobacco Use   Smoking status: Former    Packs/day: 1.00    Years: 25.00    Pack years: 25.00    Types: Cigarettes    Quit date: 03/12/1996    Years since quitting:  24.7   Smokeless tobacco: Never   Tobacco comments:    quit appox 10 yrs ago  Vaping Use   Vaping Use: Never used  Substance and Sexual Activity   Alcohol use: No    Alcohol/week: 0.0 standard drinks    Comment: former usuer AA 20+yrs ago strong recovery   Drug use: No   Sexual activity: Yes    Partners: Female  Other Topics Concern   Not on file  Social History Narrative   03/30/19:   [employer]   Long-term relationship - '95   1 cat   Social Determinants of Health   Financial Resource Strain: Low Risk    Difficulty of Paying Living Expenses: Not hard at all  Food Insecurity: No Food Insecurity   Worried About Charity fundraiser in the Last Year: Never true   [place] in the Last Year: Never true  Transportation Needs: No Transportation Needs   Lack of Transportation (Medical): No   Lack of Transportation (Non-Medical): No  Physical Activity: Insufficiently Active   Days of Exercise per Week: 5 days   Minutes of Exercise per Session: 20 min  Stress: No Stress Concern Present   Feeling of Stress : Not at all  Social Connections: Moderately Integrated   Frequency of Communication with Friends and Family: More than three times a week   Frequency of Social Gatherings with Friends and Family: Never   Attends Religious Services: Never   Marine scientist or Organizations: Yes   Attends Archivist Meetings: Never   Marital Status: Living with partner  Intimate Partner Violence: Not At Risk   Fear of Current or Ex-Partner: No   Emotionally Abused: No   Physically Abused: No   Sexually Abused: No    Outpatient Medications Prior to Visit  Medication Sig Dispense Refill   aspirin EC 81 MG tablet Take 81 mg by mouth daily. Swallow whole.     Calcium 200 MG TABS Take 200 mg by mouth every evening.      CHROMIUM PO Take 1 tablet by mouth daily.     hydrOXYzine (ATARAX/VISTARIL) 25 MG tablet TAKE 1 TABLET BY MOUTH EVERY 8 HOURS AS NEEDED  90 tablet 0   hyoscyamine (LEVBID) 0.375 MG 12 hr tablet TAKE ONE TABLET BY MOUTH TWICE A DAY 180 tablet 0   L-Lysine 500 MG TABS Take 500 mg by mouth daily.     loratadine (CLARITIN)  10 MG tablet Take 1 tablet (10 mg total) by mouth daily. 90 tablet 3   Magnesium 250 MG TABS Take 250 mg by mouth every evening.      MELATONIN ER PO Take 1 tablet by mouth at bedtime.     metoprolol succinate (TOPROL-XL) 50 MG 24 hr tablet TAKE 1 TABLET BY MOUTH ONCE DAILY WITH A MEAL OR  IMMEDIATELY  FOLLOWING  A  MEAL 90 tablet 3   Multiple Vitamin (MULTIVITAMIN WITH MINERALS) TABS tablet Take 1 tablet by mouth daily.     omeprazole (PRILOSEC) 20 MG capsule Take 20 mg by mouth daily before breakfast.     rosuvastatin (CRESTOR) 10 MG tablet TAKE 1 TABLET BY MOUTH AT BEDTIME 90 tablet 3   PEG-KCl-NaCl-NaSulf-Na Asc-C (PLENVU) 140 g SOLR Take 1 kit by mouth as directed. 1 each 0   No facility-administered medications prior to visit.    Allergies  Allergen Reactions   Codeine     Makes heart race   Penicillins     unknown   Sulfonamide Derivatives     Has a rash with the use of silvadiene for burn   Aspirin     Upset stomach, High dose only    ROS Review of Systems  Constitutional:  Negative for activity change, appetite change, chills, fatigue, fever and unexpected weight change.  HENT:  Negative for ear pain, hearing loss, sore throat and trouble swallowing.   Eyes:  Negative for visual disturbance.  Respiratory:  Negative for cough and shortness of breath.   Cardiovascular:  Negative for chest pain and palpitations.  Gastrointestinal:  Negative for abdominal pain, blood in stool, constipation, diarrhea, nausea and vomiting.  Genitourinary:  Positive for frequency. Negative for dysuria and hematuria.  Musculoskeletal:  Negative for arthralgias, back pain and myalgias.  Skin:  Negative for rash.  Neurological:  Negative for dizziness, syncope and headaches.  Hematological:  Negative for  adenopathy.  Psychiatric/Behavioral:  Negative for confusion and dysphoric mood.      Objective:    Physical Exam Constitutional:      Appearance: She is well-developed.  HENT:     Head: Normocephalic and atraumatic.  Eyes:     Pupils: Pupils are equal, round, and reactive to light.  Neck:     Thyroid: No thyromegaly.  Cardiovascular:     Rate and Rhythm: Normal rate and regular rhythm.     Heart sounds: Normal heart sounds. No murmur heard. Pulmonary:     Effort: No respiratory distress.     Breath sounds: Normal breath sounds. No wheezing or rales.  Abdominal:     General: Bowel sounds are normal. There is no distension.     Palpations: Abdomen is soft. There is no mass.     Tenderness: There is no abdominal tenderness. There is no guarding or rebound.  Musculoskeletal:        General: Normal range of motion.     Cervical back: Normal range of motion and neck supple.  Lymphadenopathy:     Cervical: No cervical adenopathy.  Skin:    Findings: No rash.  Neurological:     Mental Status: She is alert and oriented to person, place, and time.     Cranial Nerves: No cranial nerve deficit.     Deep Tendon Reflexes: Reflexes normal.  Psychiatric:        Behavior: Behavior normal.        Thought Content: Thought content normal.  Judgment: Judgment normal.    BP (!) 100/44 (BP Location: Left Arm, Patient Position: Sitting, Cuff Size: Normal)   Pulse 75   Temp 97.9 F (36.6 C) (Oral)   Ht _0  (1.651 m)   Wt 133 lb 14.4 oz (60.7 kg)   SpO2 98%   BMI 22.28 kg/m  Wt Readings from Last 3 Encounters:  12/06/20 133 lb 14.4 oz (60.7 kg)  10/19/20 139 lb (63 kg)  08/01/20 135 lb (61.2 kg)     Health Maintenance Due  Topic Date Due   Hepatitis C Screening  Never done   INFLUENZA VACCINE  09/11/2020   COVID-19 Vaccine (5 - Booster for [place] series) 09/21/2020    There are no preventive care reminders to display for this patient.  Lab Results  Component Value  Date   TSH 1.850 08/25/2018   Lab Results  Component Value Date   WBC 6.1 12/05/2020   HGB 11.8 (L) 12/05/2020   HCT 35.7 (L) 12/05/2020   MCV 92.0 12/05/2020   PLT 228 12/05/2020   Lab Results  Component Value Date   NA 139 12/05/2020   K 4.2 12/05/2020   CO2 25 12/05/2020   GLUCOSE 96 12/05/2020   BUN 14 12/05/2020   CREATININE 0.89 12/05/2020   BILITOT 0.4 12/05/2020   ALKPHOS 65 12/05/2020   AST 30 12/05/2020   ALT 23 12/05/2020   PROT 7.2 12/05/2020   ALBUMIN 4.1 12/05/2020   CALCIUM 9.6 12/05/2020   ANIONGAP 10 12/05/2020   GFR 60.97 01/02/2018   Lab Results  Component Value Date   CHOL 132 08/07/2020   Lab Results  Component Value Date   HDL 54 08/07/2020   Lab Results  Component Value Date   LDLCALC 57 08/07/2020   Lab Results  Component Value Date   TRIG 118 08/07/2020   Lab Results  Component Value Date   CHOLHDL 2.4 08/07/2020   Lab Results  Component Value Date   HGBA1C 5.9 (H) 03/03/2019      Assessment & Plan:   Problem List Items Addressed This Visit   None Visit Diagnoses     Physical exam    -  Primary   Encounter for hepatitis C screening test for low risk patient       Relevant Orders   Hep C Antibody     -Flu vaccine given -Patient is reluctant to consider Prevnar 20 because of prior reaction locally with Pneumovax several years ago -Check hepatitis C antibody though low risk. -We did not order any other labs such as CBC, CMP, lipids because of these being done recently -She plans to continue with yearly mammograms -We discussed Shingrix vaccine and she declines  No orders of the defined types were placed in this encounter.   Follow-up: No follow-ups on file.    Carolann Littler, MD

## 2020-12-07 ENCOUNTER — Inpatient Hospital Stay (HOSPITAL_BASED_OUTPATIENT_CLINIC_OR_DEPARTMENT_OTHER): Payer: Medicare HMO | Admitting: Internal Medicine

## 2020-12-07 ENCOUNTER — Other Ambulatory Visit: Payer: Self-pay

## 2020-12-07 ENCOUNTER — Encounter: Payer: Self-pay | Admitting: Internal Medicine

## 2020-12-07 VITALS — BP 133/59 | HR 73 | Temp 97.7°F | Resp 18 | Ht 65.0 in | Wt 134.5 lb

## 2020-12-07 DIAGNOSIS — C3490 Malignant neoplasm of unspecified part of unspecified bronchus or lung: Secondary | ICD-10-CM

## 2020-12-07 DIAGNOSIS — Z85118 Personal history of other malignant neoplasm of bronchus and lung: Secondary | ICD-10-CM | POA: Diagnosis not present

## 2020-12-07 LAB — HEPATITIS C ANTIBODY
Hepatitis C Ab: NONREACTIVE
SIGNAL TO CUT-OFF: 0 (ref <1.00)

## 2020-12-07 NOTE — Progress Notes (Signed)
[facility] [phone]   Fax:([phone]  OFFICE PROGRESS NOTE  [name], MD [address] [place] [address]  DIAGNOSIS: Stage IA (T1b, N0, M0) non-small cell lung cancer, adenocarcinoma.  She presented with a right lower lobe pulmonary nodule.  She was diagnosed in September 2021.  PRIOR THERAPY: Status post robotic assisted right thoracoscopy with right lower lobe superior segmentectomy and lymph node dissection under the care of Dr. Roxan Hockey on 10/25/2019.  CURRENT THERAPY: Observation.  INTERVAL HISTORY: Teresa Booker 75 y.o. female returns to the clinic today for 38-month follow-up visit.  The patient is feeling fine today with no concerning complaints except for intermittent pain on the right side of the chest from the surgical scar.  She denied having any shortness of breath, cough or hemoptysis.  She denied having any fever or chills.  She has no nausea, vomiting, diarrhea or constipation.  She intentionally lost around 5 pounds recently.  She denied having any headache or visual changes.  She had repeat CT scan of the chest performed recently and she is here for evaluation and discussion of her scan results.   MEDICAL HISTORY: Past Medical History:  Diagnosis Date   Alcohol abuse    - abstemious 20 years. Attends AA - strong recovery    Allergic reaction    Allergy    seasonal   Anxiety    COPD (chronic obstructive pulmonary disease) (HCC)    minor   Family history of adverse reaction to anesthesia    mother had problems with n/v   Fibromyalgia    slight   GERD (gastroesophageal reflux disease)    Hyperlipemia    Hypertension    IBS (irritable bowel syndrome)    NSCL Ca dx'd 10/24/20   Postnasal drip    RBBB (right bundle branch block)    Rotator cuff syndrome    right    ALLERGIES:  is allergic to codeine, penicillins, sulfonamide derivatives, and aspirin.  MEDICATIONS:  Current Outpatient Medications   Medication Sig Dispense Refill   aspirin EC 81 MG tablet Take 81 mg by mouth daily. Swallow whole.     Calcium 200 MG TABS Take 200 mg by mouth every evening.      CHROMIUM PO Take 1 tablet by mouth daily.     hydrOXYzine (ATARAX/VISTARIL) 25 MG tablet TAKE 1 TABLET BY MOUTH EVERY 8 HOURS AS NEEDED 90 tablet 0   hyoscyamine (LEVBID) 0.375 MG 12 hr tablet TAKE ONE TABLET BY MOUTH TWICE A DAY 180 tablet 0   L-Lysine 500 MG TABS Take 500 mg by mouth daily.     loratadine (CLARITIN) 10 MG tablet Take 1 tablet (10 mg total) by mouth daily. 90 tablet 3   Magnesium 250 MG TABS Take 250 mg by mouth every evening.      MELATONIN ER PO Take 1 tablet by mouth at bedtime.     metoprolol succinate (TOPROL-XL) 50 MG 24 hr tablet TAKE 1 TABLET BY MOUTH ONCE DAILY WITH A MEAL OR  IMMEDIATELY  FOLLOWING  A  MEAL 90 tablet 3   Multiple Vitamin (MULTIVITAMIN WITH MINERALS) TABS tablet Take 1 tablet by mouth daily.     omeprazole (PRILOSEC) 20 MG capsule Take 20 mg by mouth daily before breakfast.     rosuvastatin (CRESTOR) 10 MG tablet TAKE 1 TABLET BY MOUTH AT BEDTIME 90 tablet 3   No current facility-administered medications for this visit.  SURGICAL HISTORY:  Past Surgical History:  Procedure Laterality Date   COLONOSCOPY     INTERCOSTAL NERVE BLOCK Right 10/25/2019   Procedure: INTERCOSTAL NERVE BLOCK;  Surgeon: [name], MD;  Location: [facility] OR;  Service: Thoracic;  Laterality: Right;   MOUTH SURGERY  2002   NODE DISSECTION Right 10/25/2019   Procedure: NODE DISSECTION;  Surgeon: [name], MD;  Location: [facility] OR;  Service: Thoracic;  Laterality: Right;   ROTATOR CUFF REPAIR  10-24-05   had bone spur and laceration off cuff. Daldorf   XI ROBOTIC ASSISTED THORACOSCOPY- SEGMENTECTOMY Right 10/25/2019   Procedure: XI ROBOTIC ASSISTED THORACOSCOPY-SUPERIOR SEGMENTECTOMY;  Surgeon: [name], MD;  Location: [facility] OR;  Service: Thoracic;  Laterality: Right;    REVIEW OF  SYSTEMS:  A comprehensive review of systems was negative except for: Respiratory: positive for pleurisy/chest pain   PHYSICAL EXAMINATION: General appearance: alert, cooperative, and no distress Head: Normocephalic, without obvious abnormality, atraumatic Neck: no adenopathy, no JVD, supple, symmetrical, trachea midline, and thyroid not enlarged, symmetric, no tenderness/mass/nodules Lymph nodes: Cervical, supraclavicular, and axillary nodes normal. Resp: clear to auscultation bilaterally Back: symmetric, no curvature. ROM normal. No CVA tenderness. Cardio: regular rate and rhythm, S1, S2 normal, no murmur, click, rub or gallop GI: soft, non-tender; bowel sounds normal; no masses,  no organomegaly Extremities: extremities normal, atraumatic, no cyanosis or edema  ECOG PERFORMANCE STATUS: 1 - Symptomatic but completely ambulatory  Blood pressure (!) 133/59, pulse 73, temperature 97.7 F (36.5 C), temperature source Oral, resp. rate 18, height 5\' 5"  (1.651 m), weight 134 lb 8 oz (61 kg), SpO2 100 %.  LABORATORY DATA: Lab Results  Component Value Date   WBC 6.1 12/05/2020   HGB 11.8 (L) 12/05/2020   HCT 35.7 (L) 12/05/2020   MCV 92.0 12/05/2020   PLT 228 12/05/2020      Chemistry      Component Value Date/Time   NA 139 12/05/2020 1354   NA 143 08/25/2018 1043   K 4.2 12/05/2020 1354   CL 104 12/05/2020 1354   CO2 25 12/05/2020 1354   BUN 14 12/05/2020 1354   BUN 17 08/25/2018 1043   CREATININE 0.89 12/05/2020 1354      Component Value Date/Time   CALCIUM 9.6 12/05/2020 1354   ALKPHOS 65 12/05/2020 1354   AST 30 12/05/2020 1354   ALT 23 12/05/2020 1354   BILITOT 0.4 12/05/2020 1354       RADIOGRAPHIC STUDIES: CT Chest W Contrast  Result Date: 12/06/2020 CLINICAL DATA:  Primary Cancer Type: Lung Imaging Indication: Routine surveillance Interval therapy since last imaging? No Initial Cancer Diagnosis Date: 10/24/2020; Established by: Biopsy-proven Detailed Pathology:  Stage IA non-small cell lung cancer, adenocarcinoma. Primary Tumor location:  Right lower lobe. Surgeries: Right lower lobe superior segmentectomy 10/24/2020. Chemotherapy: No Immunotherapy? No Radiation therapy? No EXAM: CT CHEST WITH CONTRAST TECHNIQUE: Multidetector CT imaging of the chest was performed during intravenous contrast administration. CONTRAST:  56mL OMNIPAQUE IOHEXOL 350 MG/ML SOLN COMPARISON:  Most recent CT chest 06/05/2020.  10/08/2019 PET-CT. FINDINGS: Cardiovascular: The heart size is normal. No substantial pericardial effusion. Coronary artery calcification is evident. Mild atherosclerotic calcification is noted in the wall of the thoracic aorta. Mediastinum/Nodes: No mediastinal lymphadenopathy. There is no hilar lymphadenopathy. The esophagus has normal imaging features. There is no axillary lymphadenopathy. Lungs/Pleura: Post treatment scarring noted right lower lobe, stable. Several scattered tiny peripheral pulmonary nodules are unchanged. No new suspicious pulmonary nodule or mass. No focal airspace consolidation.  No pleural effusion. Upper Abdomen: Unremarkable. Musculoskeletal: No worrisome lytic or sclerotic osseous abnormality. IMPRESSION: 1. Stable exam. No new or progressive findings. 2. Post treatment scarring in the right lower lobe. 3. Aortic Atherosclerosis (ICD10-I70.0). Electronically Signed   By: Misty Stanley M.D.   On: 12/06/2020 11:55     ASSESSMENT AND PLAN: This is a very pleasant 75 years old white female recently diagnosed with a stage IA (T1b, N0, M0) non-small cell lung cancer, adenocarcinoma diagnosed in September 2021 status post right lower lobe superior segmentectomy with lymph node dissection under the care of Dr. Roxan Hockey. The patient is currently on observation and she is feeling fine today with no concerning complaints. She had repeat CT scan of the chest performed recently.  I personally and independently reviewed the scans and discussed the results  with the patient today. Her scan showed no concerning findings for disease recurrence or metastasis. I recommended for her to continue on observation with repeat CT scan of the chest in 6 months. She was advised to call immediately if she has any other concerning symptoms in the interval. The patient voices understanding of current disease status and treatment options and is in agreement with the current care plan.  All questions were answered. The patient knows to call the clinic with any problems, questions or concerns. We can certainly see the patient much sooner if necessary.   Disclaimer: This note was dictated with voice recognition software. Similar sounding words can inadvertently be transcribed and may not be corrected upon review.

## 2020-12-22 ENCOUNTER — Other Ambulatory Visit: Payer: Self-pay | Admitting: Family Medicine

## 2021-02-14 ENCOUNTER — Other Ambulatory Visit: Payer: Medicare HMO | Admitting: *Deleted

## 2021-02-14 ENCOUNTER — Other Ambulatory Visit: Payer: Self-pay

## 2021-02-14 DIAGNOSIS — E782 Mixed hyperlipidemia: Secondary | ICD-10-CM | POA: Diagnosis not present

## 2021-02-14 LAB — LIPID PANEL
Chol/HDL Ratio: 2.6 ratio (ref 0.0–4.4)
Cholesterol, Total: 142 mg/dL (ref 100–199)
HDL: 54 mg/dL (ref >39)
LDL Chol Calc (NIH): 69 mg/dL (ref 0–99)
Triglycerides: 104 mg/dL (ref 0–149)
VLDL Cholesterol Cal: 19 mg/dL (ref 5–40)

## 2021-02-14 LAB — HEPATIC FUNCTION PANEL
ALT: 19 IU/L (ref 0–32)
AST: 27 IU/L (ref 0–40)
Albumin: 4.4 g/dL (ref 3.7–4.7)
Alkaline Phosphatase: 72 IU/L (ref 44–121)
Bilirubin Total: 0.3 mg/dL (ref 0.0–1.2)
Bilirubin, Direct: <0.1 mg/dL (ref 0.00–0.40)
Total Protein: 6.8 g/dL (ref 6.0–8.5)

## 2021-02-15 ENCOUNTER — Telehealth: Payer: Self-pay

## 2021-02-15 DIAGNOSIS — E785 Hyperlipidemia, unspecified: Secondary | ICD-10-CM

## 2021-02-15 NOTE — Telephone Encounter (Signed)
-----   Message from Josue Hector, MD sent at 02/14/2021  5:48 PM EST ----- Cholesterol is at goal and LFT's are normal F/U labs in 6 months

## 2021-02-15 NOTE — Telephone Encounter (Signed)
Orders placed for lab work. 

## 2021-03-13 ENCOUNTER — Other Ambulatory Visit: Payer: Self-pay | Admitting: Cardiovascular Disease

## 2021-03-23 ENCOUNTER — Other Ambulatory Visit: Payer: Self-pay | Admitting: Family Medicine

## 2021-03-28 ENCOUNTER — Other Ambulatory Visit: Payer: Self-pay | Admitting: Family Medicine

## 2021-04-02 ENCOUNTER — Ambulatory Visit (INDEPENDENT_AMBULATORY_CARE_PROVIDER_SITE_OTHER): Payer: Medicare HMO

## 2021-04-02 VITALS — Ht 66.0 in | Wt 132.0 lb

## 2021-04-02 DIAGNOSIS — Z0000 Encounter for general adult medical examination without abnormal findings: Secondary | ICD-10-CM

## 2021-04-02 NOTE — Progress Notes (Signed)
I connected with Teresa Booker today by telephone and verified that I am speaking with the correct person using two identifiers. Location patient: home Location provider: work Persons participating in the virtual visit: Kaisley, [place] LPN.   I discussed the limitations, risks, security and privacy concerns of performing an evaluation and management service by telephone and the availability of in person appointments. I also discussed with the patient that there may be a patient responsible charge related to this service. The patient expressed understanding and verbally consented to this telephonic visit.    Interactive audio and video telecommunications were attempted between this provider and patient, however failed, due to patient having technical difficulties OR patient did not have access to video capability.  We continued and completed visit with audio only.     Vital signs may be patient reported or missing.  Subjective:   Teresa Booker is a 76 y.o. female who presents for Medicare Annual (Subsequent) preventive examination.  Review of Systems     Cardiac Risk Factors include: advanced age (>71men, >35 women);dyslipidemia     Objective:    Today's Vitals   04/02/21 1556  Weight: 132 lb (59.9 kg)  Height: 5\' 6"  (1.676 m)   Body mass index is 21.31 kg/m.  Advanced Directives 04/02/2021 12/07/2020 05/01/2020 10/25/2019 10/15/2019 03/30/2019  Does Patient Have a Medical Advance Directive? Yes Yes Yes Yes Yes Yes  Type of Paramedic of [place];Living will [place];Living will [place];Living will [place];Living will [place];Living will [place];Living will  Does patient want to make changes to medical advance directive? - - No - Patient declined No - Patient declined No - Patient declined No - Patient declined  Copy of [place] in Chart?  Yes - validated most recent copy scanned in chart (See row information) No - copy requested Yes - validated most recent copy scanned in chart (See row information) No - copy requested No - copy requested No - copy requested    Current Medications (verified) Outpatient Encounter Medications as of 04/02/2021  Medication Sig   aspirin EC 81 MG tablet Take 81 mg by mouth daily. Swallow whole.   Calcium 200 MG TABS Take 200 mg by mouth every evening.    CHROMIUM PO Take 1 tablet by mouth daily.   hydrOXYzine (ATARAX) 25 MG tablet TAKE 1 TABLET BY MOUTH EVERY 8 HOURS AS NEEDED   hyoscyamine (LEVBID) 0.375 MG 12 hr tablet TAKE ONE TABLET BY MOUTH TWICE A DAY   L-Lysine 500 MG TABS Take 500 mg by mouth daily.   loratadine (CLARITIN) 10 MG tablet Take 1 tablet (10 mg total) by mouth daily.   Magnesium 250 MG TABS Take 250 mg by mouth every evening.    metoprolol succinate (TOPROL-XL) 50 MG 24 hr tablet TAKE 1 TABLET BY MOUTH ONCE DAILY WITH A MEAL OR  IMMEDIATELY  FOLLOWING  A  MEAL   Multiple Vitamin (MULTIVITAMIN WITH MINERALS) TABS tablet Take 1 tablet by mouth daily.   omeprazole (PRILOSEC) 20 MG capsule Take 20 mg by mouth daily before breakfast.   rosuvastatin (CRESTOR) 10 MG tablet TAKE 1 TABLET BY MOUTH AT BEDTIME   MELATONIN ER PO Take 1 tablet by mouth at bedtime. (Patient not taking: Reported on 04/02/2021)   No facility-administered encounter medications on file as of 04/02/2021.    Allergies (verified) Codeine, Penicillins, Sulfonamide derivatives, and Aspirin  History: Past Medical History:  Diagnosis Date   Alcohol abuse    - abstemious 20 years. Attends AA - strong recovery    Allergic reaction    Allergy    seasonal   Anxiety    COPD (chronic obstructive pulmonary disease) (HCC)    minor   Family history of adverse reaction to anesthesia    mother had problems with n/v   Fibromyalgia    slight   GERD (gastroesophageal reflux disease)    Hyperlipemia    Hypertension     IBS (irritable bowel syndrome)    NSCL Ca dx'd 10/24/20   Postnasal drip    RBBB (right bundle branch block)    Rotator cuff syndrome    right   Past Surgical History:  Procedure Laterality Date   COLONOSCOPY     INTERCOSTAL NERVE BLOCK Right 10/25/2019   Procedure: INTERCOSTAL NERVE BLOCK;  Surgeon: [name], MD;  Location: [facility] OR;  Service: Thoracic;  Laterality: Right;   MOUTH SURGERY  2002   NODE DISSECTION Right 10/25/2019   Procedure: NODE DISSECTION;  Surgeon: [name], MD;  Location: [facility];  Service: Thoracic;  Laterality: Right;   ROTATOR CUFF REPAIR  10-24-05   had bone spur and laceration off cuff. [place]   XI ROBOTIC ASSISTED THORACOSCOPY- SEGMENTECTOMY Right 10/25/2019   Procedure: XI ROBOTIC ASSISTED THORACOSCOPY-SUPERIOR SEGMENTECTOMY;  Surgeon: [name], MD;  Location: [facility] OR;  Service: Thoracic;  Laterality: Right;   Family History  Problem Relation Age of Onset   Hyperlipidemia Mother    Irritable bowel syndrome Mother    Coronary artery disease Mother    Heart attack Mother    Aneurysm Mother        brain   Stroke Father    Diabetes Brother    Cancer Brother        small cell lung ca with mets/ smoker   Brain cancer Brother    Lung cancer Brother    Cancer Brother 79       liver cancer ?primary   Kidney cancer Brother    Stroke Other    Aneurysm Other    Lung cancer Other    Colon cancer Neg Hx    Esophageal cancer Neg Hx    Pancreatic cancer Neg Hx    Stomach cancer Neg Hx    Social History   Socioeconomic History   Marital status: Single    Spouse name: Not on file   Number of children: 0   Years of education: 16   Highest education level: Bachelor's degree (e.g., BA, AB, BS)  Occupational History   Occupation: Freight forwarder    Comment: retired  Tobacco Use   Smoking status: Former    Packs/day: 1.00    Years: 25.00    Pack years: 25.00    Types: Cigarettes    Quit date: 03/12/1996    Years since  quitting: 25.0    Passive exposure: Past   Smokeless tobacco: Never   Tobacco comments:    quit appox 10 yrs ago  Vaping Use   Vaping Use: Never used  Substance and Sexual Activity   Alcohol use: No    Alcohol/week: 0.0 standard drinks    Comment: former usuer AA 20+yrs ago strong recovery   Drug use: No   Sexual activity: Yes    Partners: Female  Other Topics Concern   Not on file  Social History Narrative   03/30/19:   [place] [employer]  [employer]   Long-term relationship - '95   1 cat   Social Determinants of Health   Financial Resource Strain: Low Risk    Difficulty of Paying Living Expenses: Not hard at all  Food Insecurity: No Food Insecurity   Worried About Charity fundraiser in the Last Year: Never true   Arboriculturist in the Last Year: Never true  Transportation Needs: No Transportation Needs   Lack of Transportation (Medical): No   Lack of Transportation (Non-Medical): No  Physical Activity: Insufficiently Active   Days of Exercise per Week: 4 days   Minutes of Exercise per Session: 30 min  Stress: No Stress Concern Present   Feeling of Stress : Only a little  Social Connections: Moderately Integrated   Frequency of Communication with Friends and Family: More than three times a week   Frequency of Social Gatherings with Friends and Family: Never   Attends Religious Services: Never   Marine scientist or Organizations: Yes   Attends Archivist Meetings: Never   Marital Status: Living with partner    Tobacco Counseling Counseling given: Not Answered Tobacco comments: quit appox 10 yrs ago   Clinical Intake:  Pre-visit preparation completed: Yes  Pain : No/denies pain     Nutritional Status: BMI of 19-24  Normal Nutritional Risks: None Diabetes: No  How often do you need to have someone help you when you read instructions, pamphlets, or other written materials from your doctor or pharmacy?: 1 - Never What is the last grade level  you completed in school?: bachelor's degree  Diabetic? no  Interpreter Needed?: No  Information entered by :: NAllen LPN   Activities of Daily Living In your present state of health, do you have any difficulty performing the following activities: 04/02/2021 03/28/2021  Hearing? N N  Comment - -  Vision? N N  Difficulty concentrating or making decisions? N N  Walking or climbing stairs? N N  Dressing or bathing? N N  Doing errands, shopping? N N  Preparing Food and eating ? N N  Using the Toilet? N N  In the past six months, have you accidently leaked urine? N N  Do you have problems with loss of bowel control? N N  Managing your Medications? N N  Managing your Finances? N N  Housekeeping or managing your Housekeeping? N N  Some recent data might be hidden    Patient Care Team: [name], MD as PCP - General (Family Medicine) Josue Hector, MD as PCP - Cardiology (Cardiology)  Indicate any recent Medical Services you may have received from other than [facility] providers in the past year (date may be approximate).     Assessment:   This is a routine wellness examination for [place].  Hearing/Vision screen Vision Screening - Comments:: Regular eye exams, [facility]  Dietary issues and exercise activities discussed: Current Exercise Habits: Home exercise routine, Type of exercise: Other - see comments, Time (Minutes): 30, Frequency (Times/Week): 4, Weekly Exercise (Minutes/Week): 120   Goals Addressed             This Visit's Progress    Patient Stated       04/02/2021, when weather gets better wants to start riding bike       Depression Screen PHQ 2/9 Scores 04/02/2021 05/01/2020 03/30/2019 11/20/2018 08/26/2017 05/14/2016 08/31/2014  PHQ - 2 Score 0 0 0 0 0 0 0    Fall Risk Fall Risk  04/02/2021 03/28/2021 05/01/2020 03/30/2019 11/20/2018  Falls in the past year? 0 0 0 0 0  Number falls in past yr: - 0 0 - -  Injury with Fall? - 0 0 - -  Risk for fall due to :  Medication side effect - No Fall Risks Medication side effect -  Follow up Falls evaluation completed;Education provided;Falls prevention discussed - Falls evaluation completed;Falls prevention discussed Falls evaluation completed;Education provided;Falls prevention discussed Education provided;Falls evaluation completed;Falls prevention discussed    FALL RISK PREVENTION PERTAINING TO THE HOME:  Any stairs in or around the home? Yes  If so, are there any without handrails? No  Home free of loose throw rugs in walkways, pet beds, electrical cords, etc? Yes  Adequate lighting in your home to reduce risk of falls? Yes   ASSISTIVE DEVICES UTILIZED TO PREVENT FALLS:  Life alert? No  Use of a cane, walker or w/c? No  Grab bars in the bathroom? No  Shower chair or bench in shower? No  Elevated toilet seat or a handicapped toilet? Yes   TIMED UP AND GO:  Was the test performed? No .      Cognitive Function:     6CIT Screen 04/02/2021 03/30/2019 03/30/2019  What Year? 0 points 0 points 0 points  What month? 0 points 0 points 0 points  What time? 0 points 0 points 0 points  Count back from 20 0 points 0 points 0 points  Months in reverse 0 points 0 points 0 points  Repeat phrase 2 points 0 points 0 points  Total Score 2 0 0    Immunizations Immunization History  Administered Date(s) Administered   Fluad Quad(high Dose 65+) 12/06/2020   Influenza Split 11/29/2010   Influenza, High Dose Seasonal PF 11/27/2018   Influenza,inj,Quad PF,6+ Mos 01/11/2014, 12/06/2016   Influenza-Unspecified 12/22/2014, 12/19/2015, 11/26/2017, 12/03/2019   PFIZER(Purple Top)SARS-COV-2 Vaccination 03/18/2019, 04/12/2019, 11/29/2019, 07/27/2020   Pneumococcal Polysaccharide-23 01/23/2009   Td 01/23/2009, 11/20/2018   Zoster, Live 07/10/2010    TDAP status: Up to date  Flu Vaccine status: Up to date  Pneumococcal vaccine status: Declined,  Education has been provided regarding the importance of this  vaccine but patient still declined. Advised may receive this vaccine at local pharmacy or Health Dept. Aware to provide a copy of the vaccination record if obtained from local pharmacy or Health Dept. Verbalized acceptance and understanding.   Covid-19 vaccine status: Completed vaccines  Qualifies for Shingles Vaccine? Yes   Zostavax completed Yes   Shingrix Completed?: No.    Education has been provided regarding the importance of this vaccine. Patient has been advised to call insurance company to determine out of pocket expense if they have not yet received this vaccine. Advised may also receive vaccine at local pharmacy or Health Dept. Verbalized acceptance and understanding.  Screening Tests Health Maintenance  Topic Date Due   Zoster Vaccines- Shingrix (1 of 2) Never done   COVID-19 Vaccine (5 - Booster for Pfizer series) 09/21/2020   Pneumonia Vaccine 18+ Years old (2 - PCV) 12/06/2021 (Originally 01/23/2010)   MAMMOGRAM  10/04/2021   COLONOSCOPY (Pts 45-2yrs Insurance coverage will need to be confirmed)  10/19/2025   TETANUS/TDAP  11/19/2028   INFLUENZA VACCINE  Completed   DEXA SCAN  Completed   Hepatitis C Screening  Completed   HPV VACCINES  Aged Out    Health Maintenance  Health Maintenance Due  Topic Date Due   Zoster Vaccines- Shingrix (1 of 2) Never done  COVID-19 Vaccine (5 - Booster for Pfizer series) 09/21/2020    Colorectal cancer screening: No longer required.   Mammogram status: Completed 10/04/2020. Repeat every year  Bone Density status: Completed 03/19/2018.  Lung Cancer Screening: (Low Dose CT Chest recommended if Age 67-80 years, 30 pack-year currently smoking OR have quit w/in 15years.) does not qualify.   Lung Cancer Screening Referral: no  Additional Screening:  Hepatitis C Screening: does qualify; Completed 12/06/2020  Vision Screening: Recommended annual ophthalmology exams for early detection of glaucoma and other disorders of the eye. Is  the patient up to date with their annual eye exam?  Yes  Who is the provider or what is the name of the office in which the patient attends annual eye exams? [facility] If pt is not established with a provider, would they like to be referred to a provider to establish care? No .   Dental Screening: Recommended annual dental exams for proper oral hygiene  Community Resource Referral / Chronic Care Management: CRR required this visit?  No   CCM required this visit?  No      Plan:     I have personally reviewed and noted the following in the patients chart:   Medical and social history Use of alcohol, tobacco or illicit drugs  Current medications and supplements including opioid prescriptions.  Functional ability and status Nutritional status Physical activity Advanced directives List of other physicians Hospitalizations, surgeries, and ER visits in previous 12 months Vitals Screenings to include cognitive, depression, and falls Referrals and appointments  In addition, I have reviewed and discussed with patient certain preventive protocols, quality metrics, and best practice recommendations. A written personalized care plan for preventive services as well as general preventive health recommendations were provided to patient.     Kellie Simmering, LPN   [id number]   Nurse Notes: none  Due to this being a virtual visit, the after visit summary with patients personalized plan was offered to patient via mail or my-chart. Patient would like to access on my-chart

## 2021-04-02 NOTE — Patient Instructions (Signed)
[name] , Thank you for taking time to come for your Medicare Wellness Visit. I appreciate your ongoing commitment to your health goals. Please review the following plan we discussed and let me know if I can assist you in the future.   Screening recommendations/referrals: Colonoscopy: not required Mammogram: completed 10/04/2020, due 10/05/2021 Bone Density: completed 03/19/2018 Recommended yearly ophthalmology/optometry visit for glaucoma screening and checkup Recommended yearly dental visit for hygiene and checkup  Vaccinations: Influenza vaccine: completed 12/06/2020, due next flu season Pneumococcal vaccine: n/a Tdap vaccine: completed 11/20/2018, due 11/19/2028 Shingles vaccine: discussed   Covid-19: 07/27/2020, 11/29/2019, 04/12/2019, 03/18/2019  Advanced directives: copy in chart  Conditions/risks identified: none  Next appointment: Follow up in one year for your annual wellness visit    Preventive Care 55 Years and Older, Female Preventive care refers to lifestyle choices and visits with your health care provider that can promote health and wellness. What does preventive care include? A yearly physical exam. This is also called an annual well check. Dental exams once or twice a year. Routine eye exams. Ask your health care provider how often you should have your eyes checked. Personal lifestyle choices, including: Daily care of your teeth and gums. Regular physical activity. Eating a healthy diet. Avoiding tobacco and drug use. Limiting alcohol use. Practicing safe sex. Taking low-dose aspirin every day. Taking vitamin and mineral supplements as recommended by your health care provider. What happens during an annual well check? The services and screenings done by your health care provider during your annual well check will depend on your age, overall health, lifestyle risk factors, and family history of disease. Counseling  Your health care provider may ask you questions about  your: Alcohol use. Tobacco use. Drug use. Emotional well-being. Home and relationship well-being. Sexual activity. Eating habits. History of falls. Memory and ability to understand (cognition). Work and work Statistician. Reproductive health. Screening  You may have the following tests or measurements: Height, weight, and BMI. Blood pressure. Lipid and cholesterol levels. These may be checked every 5 years, or more frequently if you are over 18 years old. Skin check. Lung cancer screening. You may have this screening every year starting at age 48 if you have a 30-pack-year history of smoking and currently smoke or have quit within the past 15 years. Fecal occult blood test (FOBT) of the stool. You may have this test every year starting at age 13. Flexible sigmoidoscopy or colonoscopy. You may have a sigmoidoscopy every 5 years or a colonoscopy every 10 years starting at age 31. Hepatitis C blood test. Hepatitis B blood test. Sexually transmitted disease (STD) testing. Diabetes screening. This is done by checking your blood sugar (glucose) after you have not eaten for a while (fasting). You may have this done every 1-3 years. Bone density scan. This is done to screen for osteoporosis. You may have this done starting at age 28. Mammogram. This may be done every 1-2 years. Talk to your health care provider about how often you should have regular mammograms. Talk with your health care provider about your test results, treatment options, and if necessary, the need for more tests. Vaccines  Your health care provider may recommend certain vaccines, such as: Influenza vaccine. This is recommended every year. Tetanus, diphtheria, and acellular pertussis (Tdap, Td) vaccine. You may need a Td booster every 10 years. Zoster vaccine. You may need this after age 87. Pneumococcal 13-valent conjugate (PCV13) vaccine. One dose is recommended after age 38. Pneumococcal polysaccharide (PPSV23) vaccine.  One dose is recommended after age 69. Talk to your health care provider about which screenings and vaccines you need and how often you need them. This information is not intended to replace advice given to you by your health care provider. Make sure you discuss any questions you have with your health care provider. Document Released: 02/24/2015 Document Revised: 10/18/2015 Document Reviewed: 11/29/2014 Elsevier Interactive Patient Education  2017 [place] Prevention in the Home Falls can cause injuries. They can happen to people of all ages. There are many things you can do to make your home safe and to help prevent falls. What can I do on the outside of my home? Regularly fix the edges of walkways and driveways and fix any cracks. Remove anything that might make you trip as you walk through a door, such as a raised step or threshold. Trim any bushes or trees on the path to your home. Use bright outdoor lighting. Clear any walking paths of anything that might make someone trip, such as rocks or tools. Regularly check to see if handrails are loose or broken. Make sure that both sides of any steps have handrails. Any raised decks and porches should have guardrails on the edges. Have any leaves, snow, or ice cleared regularly. Use sand or salt on walking paths during winter. Clean up any spills in your garage right away. This includes oil or grease spills. What can I do in the bathroom? Use night lights. Install grab bars by the toilet and in the tub and shower. Do not use towel bars as grab bars. Use non-skid mats or decals in the tub or shower. If you need to sit down in the shower, use a plastic, non-slip stool. Keep the floor dry. Clean up any water that spills on the floor as soon as it happens. Remove soap buildup in the tub or shower regularly. Attach bath mats securely with double-sided non-slip rug tape. Do not have throw rugs and other things on the floor that can make  you trip. What can I do in the bedroom? Use night lights. Make sure that you have a light by your bed that is easy to reach. Do not use any sheets or blankets that are too big for your bed. They should not hang down onto the floor. Have a firm chair that has side arms. You can use this for support while you get dressed. Do not have throw rugs and other things on the floor that can make you trip. What can I do in the kitchen? Clean up any spills right away. Avoid walking on wet floors. Keep items that you use a lot in easy-to-reach places. If you need to reach something above you, use a strong step stool that has a grab bar. Keep electrical cords out of the way. Do not use floor polish or wax that makes floors slippery. If you must use wax, use non-skid floor wax. Do not have throw rugs and other things on the floor that can make you trip. What can I do with my stairs? Do not leave any items on the stairs. Make sure that there are handrails on both sides of the stairs and use them. Fix handrails that are broken or loose. Make sure that handrails are as long as the stairways. Check any carpeting to make sure that it is firmly attached to the stairs. Fix any carpet that is loose or worn. Avoid having throw rugs at the top or bottom of the  stairs. If you do have throw rugs, attach them to the floor with carpet tape. Make sure that you have a light switch at the top of the stairs and the bottom of the stairs. If you do not have them, ask someone to add them for you. What else can I do to help prevent falls? Wear shoes that: Do not have high heels. Have rubber bottoms. Are comfortable and fit you well. Are closed at the toe. Do not wear sandals. If you use a stepladder: Make sure that it is fully opened. Do not climb a closed stepladder. Make sure that both sides of the stepladder are locked into place. Ask someone to hold it for you, if possible. Clearly mark and make sure that you can  see: Any grab bars or handrails. First and last steps. Where the edge of each step is. Use tools that help you move around (mobility aids) if they are needed. These include: Canes. Walkers. Scooters. Crutches. Turn on the lights when you go into a dark area. Replace any light bulbs as soon as they burn out. Set up your furniture so you have a clear path. Avoid moving your furniture around. If any of your floors are uneven, fix them. If there are any pets around you, be aware of where they are. Review your medicines with your doctor. Some medicines can make you feel dizzy. This can increase your chance of falling. Ask your doctor what other things that you can do to help prevent falls. This information is not intended to replace advice given to you by your health care provider. Make sure you discuss any questions you have with your health care provider. Document Released: 11/24/2008 Document Revised: 07/06/2015 Document Reviewed: 03/04/2014 Elsevier Interactive Patient Education  2017 [employer].

## 2021-04-06 DIAGNOSIS — E785 Hyperlipidemia, unspecified: Secondary | ICD-10-CM | POA: Diagnosis not present

## 2021-04-06 DIAGNOSIS — Z008 Encounter for other general examination: Secondary | ICD-10-CM | POA: Diagnosis not present

## 2021-04-06 DIAGNOSIS — I739 Peripheral vascular disease, unspecified: Secondary | ICD-10-CM | POA: Diagnosis not present

## 2021-04-06 DIAGNOSIS — Z7982 Long term (current) use of aspirin: Secondary | ICD-10-CM | POA: Diagnosis not present

## 2021-04-06 DIAGNOSIS — R69 Illness, unspecified: Secondary | ICD-10-CM | POA: Diagnosis not present

## 2021-04-06 DIAGNOSIS — K219 Gastro-esophageal reflux disease without esophagitis: Secondary | ICD-10-CM | POA: Diagnosis not present

## 2021-04-06 DIAGNOSIS — Z809 Family history of malignant neoplasm, unspecified: Secondary | ICD-10-CM | POA: Diagnosis not present

## 2021-04-06 DIAGNOSIS — I10 Essential (primary) hypertension: Secondary | ICD-10-CM | POA: Diagnosis not present

## 2021-04-06 DIAGNOSIS — Z811 Family history of alcohol abuse and dependence: Secondary | ICD-10-CM | POA: Diagnosis not present

## 2021-04-06 DIAGNOSIS — K5900 Constipation, unspecified: Secondary | ICD-10-CM | POA: Diagnosis not present

## 2021-04-06 DIAGNOSIS — Z791 Long term (current) use of non-steroidal anti-inflammatories (NSAID): Secondary | ICD-10-CM | POA: Diagnosis not present

## 2021-05-14 ENCOUNTER — Encounter: Payer: Self-pay | Admitting: Internal Medicine

## 2021-05-14 ENCOUNTER — Encounter: Payer: Self-pay | Admitting: Nurse Practitioner

## 2021-05-30 ENCOUNTER — Ambulatory Visit (INDEPENDENT_AMBULATORY_CARE_PROVIDER_SITE_OTHER): Payer: Medicare HMO | Admitting: Obstetrics & Gynecology

## 2021-05-30 VITALS — BP 120/70 | HR 76 | Resp 16 | Ht 65.25 in | Wt 135.0 lb

## 2021-05-30 DIAGNOSIS — B001 Herpesviral vesicular dermatitis: Secondary | ICD-10-CM

## 2021-05-30 DIAGNOSIS — C3491 Malignant neoplasm of unspecified part of right bronchus or lung: Secondary | ICD-10-CM

## 2021-05-30 DIAGNOSIS — Z01419 Encounter for gynecological examination (general) (routine) without abnormal findings: Secondary | ICD-10-CM

## 2021-05-30 DIAGNOSIS — Z780 Asymptomatic menopausal state: Secondary | ICD-10-CM

## 2021-05-30 DIAGNOSIS — Z9289 Personal history of other medical treatment: Secondary | ICD-10-CM

## 2021-05-30 DIAGNOSIS — Z9189 Other specified personal risk factors, not elsewhere classified: Secondary | ICD-10-CM

## 2021-05-30 NOTE — Progress Notes (Signed)
? ? ?Teresa Booker 27-Aug-1945 [id number] ? ? ?History:    76 y.o. G0 Single.  Same sex partner. ?  ?RP:  Established patient presenting for annual gyn exam  ?  ?HPI:  Postmenopause, well on no HRT.  No PMB.  No pelvic pain.  Abstinent x 5 years. No recent Pap.  No h/o abnormal Pap.  Breasts normal. Mammo Neg 09/2020. Urine/BMs normal.  BMI 22.29.  Had Rt lung Ca, excised Robotically 10/2019.  Doing very well and back to swimming.  BD Normal 03/2018. Healthy nutrition.  Health Labs with Fam MD.  Followed by Cardio for Dysrhythmia. Colono 10/2020. ? ? ?Past medical history,surgical history, family history and social history were all reviewed and documented in the EPIC chart. ? ?Gynecologic History ?No LMP recorded. Patient is postmenopausal. ? ?Obstetric History ?OB History  ?Gravida Para Term Preterm AB Living  ?0 0 0 0 0 0  ?SAB IAB Ectopic Multiple Live Births  ?0 0 0 0 0  ? ? ? ?ROS: A ROS was performed and pertinent positives and negatives are included in the history. ?GENERAL: No fevers or chills. HEENT: No change in vision, no earache, sore throat or sinus congestion. NECK: No pain or stiffness. CARDIOVASCULAR: No chest pain or pressure. No palpitations. PULMONARY: No shortness of breath, cough or wheeze. GASTROINTESTINAL: No abdominal pain, nausea, vomiting or diarrhea, melena or bright red blood per rectum. GENITOURINARY: No urinary frequency, urgency, hesitancy or dysuria. MUSCULOSKELETAL: No joint or muscle pain, no back pain, no recent trauma. DERMATOLOGIC: No rash, no itching, no lesions. ENDOCRINE: No polyuria, polydipsia, no heat or cold intolerance. No recent change in weight. HEMATOLOGICAL: No anemia or easy bruising or bleeding. NEUROLOGIC: No headache, seizures, numbness, tingling or weakness. PSYCHIATRIC: No depression, no loss of interest in normal activity or change in sleep pattern.  ?  ? ?Exam: ? ? ?BP 120/70   Pulse 76   Resp 16   Ht 5' 5.25" (1.657 m)   Wt 135 lb (61.2 kg)   BMI 22.29 kg/m?   ? ?Body mass index is 22.29 kg/m?. ? ?General appearance : Well developed well nourished female. No acute distress ?HEENT: Eyes: no retinal hemorrhage or exudates,  Neck supple, trachea midline, no carotid bruits, no thyroidmegaly ?Lungs: Clear to auscultation, no rhonchi or wheezes, or rib retractions  ?Heart: Regular rate and rhythm, no murmurs or gallops ?Breast:Examined in sitting and supine position were symmetrical in appearance, no palpable masses or tenderness,  no skin retraction, no nipple inversion, no nipple discharge, no skin discoloration, no axillary or supraclavicular lymphadenopathy ?Abdomen: no palpable masses or tenderness, no rebound or guarding ?Extremities: no edema or skin discoloration or tenderness ? ?Pelvic: Vulva: Normal ?            Vagina: No gross lesions or discharge ? Cervix: No gross lesions or discharge ? Uterus  AV, normal size, shape and consistency, non-tender and mobile ? Adnexa  Without masses or tenderness ? Anus: Normal ? ? ?Assessment/Plan:  76 y.o. female for annual exam  ? ?1. Well female exam with routine gynecological exam ?Postmenopause, well on no HRT.  No PMB.  No pelvic pain.  Abstinent x 5 years. No recent Pap.  No h/o abnormal Pap.  Breasts normal. Mammo Neg 09/2020. Urine/BMs normal.  BMI 22.29.  Had Rt lung Ca, excised Robotically 10/2019.  Doing very well and back to swimming.  BD Normal 03/2018. Healthy nutrition.  Health Labs with Fam MD.  Followed by Cardio for  Dysrhythmia. Colono 10/2020. ? ?2. Postmenopausal ?Postmenopause, well on no HRT.  No PMB.  No pelvic pain.  Abstinent x 5 years. ? ?3. Personal history of other medical treatment ? ?4. Adenocarcinoma of lung, stage 1, right ([facility])  ? ?Princess Bruins MD, 10:05 AM 05/30/2021 ? ?  ?

## 2021-06-04 ENCOUNTER — Ambulatory Visit (HOSPITAL_COMMUNITY)
Admission: RE | Admit: 2021-06-04 | Discharge: 2021-06-04 | Disposition: A | Discharge status: Home / Self Care | Payer: Medicare HMO | Source: Ambulatory Visit | Attending: Internal Medicine | Admitting: Internal Medicine

## 2021-06-04 ENCOUNTER — Inpatient Hospital Stay: Payer: Medicare HMO | Attending: Internal Medicine

## 2021-06-04 DIAGNOSIS — Z882 Allergy status to sulfonamides status: Secondary | ICD-10-CM | POA: Insufficient documentation

## 2021-06-04 DIAGNOSIS — C3490 Malignant neoplasm of unspecified part of unspecified bronchus or lung: Secondary | ICD-10-CM | POA: Diagnosis not present

## 2021-06-04 DIAGNOSIS — Z886 Allergy status to analgesic agent status: Secondary | ICD-10-CM | POA: Diagnosis not present

## 2021-06-04 DIAGNOSIS — Z79899 Other long term (current) drug therapy: Secondary | ICD-10-CM | POA: Diagnosis not present

## 2021-06-04 DIAGNOSIS — Z880 Allergy status to penicillin: Secondary | ICD-10-CM | POA: Diagnosis not present

## 2021-06-04 DIAGNOSIS — C3431 Malignant neoplasm of lower lobe, right bronchus or lung: Secondary | ICD-10-CM | POA: Diagnosis not present

## 2021-06-04 DIAGNOSIS — R911 Solitary pulmonary nodule: Secondary | ICD-10-CM | POA: Diagnosis not present

## 2021-06-04 DIAGNOSIS — I2510 Atherosclerotic heart disease of native coronary artery without angina pectoris: Secondary | ICD-10-CM | POA: Diagnosis not present

## 2021-06-04 DIAGNOSIS — I700 Atherosclerosis of aorta: Secondary | ICD-10-CM | POA: Diagnosis not present

## 2021-06-04 DIAGNOSIS — G629 Polyneuropathy, unspecified: Secondary | ICD-10-CM | POA: Diagnosis not present

## 2021-06-04 DIAGNOSIS — Z885 Allergy status to narcotic agent status: Secondary | ICD-10-CM | POA: Diagnosis not present

## 2021-06-04 LAB — CMP (CANCER CENTER ONLY)
ALT: 20 U/L (ref 0–44)
AST: 28 U/L (ref 15–41)
Albumin: 4.3 g/dL (ref 3.5–5.0)
Alkaline Phosphatase: 61 U/L (ref 38–126)
Anion gap: 6 (ref 5–15)
BUN: 20 mg/dL (ref 8–23)
CO2: 29 mmol/L (ref 22–32)
Calcium: 9.5 mg/dL (ref 8.9–10.3)
Chloride: 104 mmol/L (ref 98–111)
Creatinine: 1.08 mg/dL — ABNORMAL HIGH (ref 0.44–1.00)
GFR, Estimated: 54 mL/min — ABNORMAL LOW (ref >60)
Glucose, Bld: 113 mg/dL — ABNORMAL HIGH (ref 70–99)
Potassium: 4.7 mmol/L (ref 3.5–5.1)
Sodium: 139 mmol/L (ref 135–145)
Total Bilirubin: 0.4 mg/dL (ref 0.3–1.2)
Total Protein: 7.2 g/dL (ref 6.5–8.1)

## 2021-06-04 LAB — CBC WITH DIFFERENTIAL (CANCER CENTER ONLY)
Abs Immature Granulocytes: 0.01 10*3/uL (ref 0.00–0.07)
Basophils Absolute: 0 10*3/uL (ref 0.0–0.1)
Basophils Relative: 1 %
Eosinophils Absolute: 0.1 10*3/uL (ref 0.0–0.5)
Eosinophils Relative: 3 %
HCT: 37.5 % (ref 36.0–46.0)
Hemoglobin: 12.1 g/dL (ref 12.0–15.0)
Immature Granulocytes: 0 %
Lymphocytes Relative: 42 %
Lymphs Abs: 2.2 10*3/uL (ref 0.7–4.0)
MCH: 29.8 pg (ref 26.0–34.0)
MCHC: 32.3 g/dL (ref 30.0–36.0)
MCV: 92.4 fL (ref 80.0–100.0)
Monocytes Absolute: 0.5 10*3/uL (ref 0.1–1.0)
Monocytes Relative: 9 %
Neutro Abs: 2.3 10*3/uL (ref 1.7–7.7)
Neutrophils Relative %: 45 %
Platelet Count: 235 10*3/uL (ref 150–400)
RBC: 4.06 MIL/uL (ref 3.87–5.11)
RDW: 12.8 % (ref 11.5–15.5)
WBC Count: 5.2 10*3/uL (ref 4.0–10.5)
nRBC: 0 % (ref 0.0–0.2)

## 2021-06-04 MED ORDER — IOHEXOL 300 MG/ML  SOLN
100.0000 mL | Freq: Once | INTRAMUSCULAR | Status: AC | PRN
  Administered 2021-06-04: 75 mL via INTRAVENOUS

## 2021-06-04 MED ORDER — SODIUM CHLORIDE (PF) 0.9 % IJ SOLN
INTRAMUSCULAR | Status: AC
  Filled 2021-06-04: qty 50

## 2021-06-07 ENCOUNTER — Other Ambulatory Visit: Payer: Self-pay

## 2021-06-07 ENCOUNTER — Inpatient Hospital Stay: Payer: Medicare HMO | Admitting: Internal Medicine

## 2021-06-07 VITALS — BP 131/61 | HR 82 | Temp 97.9°F | Wt 136.2 lb

## 2021-06-07 DIAGNOSIS — C3491 Malignant neoplasm of unspecified part of right bronchus or lung: Secondary | ICD-10-CM

## 2021-06-07 DIAGNOSIS — Z79899 Other long term (current) drug therapy: Secondary | ICD-10-CM | POA: Diagnosis not present

## 2021-06-07 DIAGNOSIS — C3431 Malignant neoplasm of lower lobe, right bronchus or lung: Secondary | ICD-10-CM | POA: Diagnosis not present

## 2021-06-07 DIAGNOSIS — Z882 Allergy status to sulfonamides status: Secondary | ICD-10-CM | POA: Diagnosis not present

## 2021-06-07 DIAGNOSIS — Z885 Allergy status to narcotic agent status: Secondary | ICD-10-CM | POA: Diagnosis not present

## 2021-06-07 DIAGNOSIS — Z886 Allergy status to analgesic agent status: Secondary | ICD-10-CM | POA: Diagnosis not present

## 2021-06-07 DIAGNOSIS — G629 Polyneuropathy, unspecified: Secondary | ICD-10-CM | POA: Diagnosis not present

## 2021-06-07 DIAGNOSIS — C3490 Malignant neoplasm of unspecified part of unspecified bronchus or lung: Secondary | ICD-10-CM | POA: Diagnosis not present

## 2021-06-07 DIAGNOSIS — I700 Atherosclerosis of aorta: Secondary | ICD-10-CM | POA: Diagnosis not present

## 2021-06-07 DIAGNOSIS — I2510 Atherosclerotic heart disease of native coronary artery without angina pectoris: Secondary | ICD-10-CM | POA: Diagnosis not present

## 2021-06-07 DIAGNOSIS — Z880 Allergy status to penicillin: Secondary | ICD-10-CM | POA: Diagnosis not present

## 2021-06-07 NOTE — Progress Notes (Signed)
?    [facility] ?[phone]   [phone] ? ?OFFICE PROGRESS NOTE ? ?[name], MD ?[place] ?[place] [address] ? ?DIAGNOSIS: Stage IA (T1b, N0, M0) non-small cell lung cancer, adenocarcinoma.  She presented with a right lower lobe pulmonary nodule.  She was diagnosed in September 2021. ? ?PRIOR THERAPY: Status post robotic assisted right thoracoscopy with right lower lobe superior segmentectomy and lymph node dissection under the care of Dr. Roxan Hockey on 10/25/2019. ? ?CURRENT THERAPY: Observation. ? ?INTERVAL HISTORY: ?Teresa Booker 76 y.o. female returns to the clinic today for 6 months follow-up visit.  The patient is feeling fine today with no concerning complaints except for mild neuropathy in the lower extremities.  She denied having any chest pain, shortness of breath, cough or hemoptysis.  She has no nausea, vomiting, diarrhea or constipation.  She has no headache or visual changes.  She has no recent weight loss or night sweats.  She is here today for evaluation with repeat CT scan of the chest for restaging of her disease. ? ?MEDICAL HISTORY: ?Past Medical History:  ?Diagnosis Date  ? Alcohol abuse   ? - abstemious 20 years. Attends AA - strong recovery   ? Allergic reaction   ? Allergy   ? seasonal  ? Anxiety   ? COPD (chronic obstructive pulmonary disease) ([facility])   ? minor  ? Family history of adverse reaction to anesthesia   ? mother had problems with n/v  ? Fibromyalgia   ? slight  ? GERD (gastroesophageal reflux disease)   ? HSV-1 infection   ? Hyperlipemia   ? Hypertension   ? IBS (irritable bowel syndrome)   ? NSCL Ca dx'd 10/24/20  ? Postnasal drip   ? RBBB (right bundle branch block)   ? Rotator cuff syndrome   ? right  ? ? ?ALLERGIES:  is allergic to codeine, penicillins, sulfonamide derivatives, and aspirin. ? ?MEDICATIONS:  ?Current Outpatient Medications  ?Medication Sig Dispense Refill  ? aspirin EC 81 MG tablet Take 81 mg by mouth  daily. Swallow whole.    ? Calcium 200 MG TABS Take 200 mg by mouth every evening.     ? CHROMIUM PO Take 1 tablet by mouth daily. (Patient not taking: Reported on 05/30/2021)    ? hydrOXYzine (ATARAX) 25 MG tablet TAKE 1 TABLET BY MOUTH EVERY 8 HOURS AS NEEDED 90 tablet 0  ? hyoscyamine (LEVBID) 0.375 MG 12 hr tablet TAKE ONE TABLET BY MOUTH TWICE A DAY 180 tablet 0  ? L-Lysine 500 MG TABS Take 500 mg by mouth daily.    ? loratadine (CLARITIN) 10 MG tablet Take 1 tablet (10 mg total) by mouth daily. 90 tablet 3  ? Magnesium 250 MG TABS Take 250 mg by mouth every evening.     ? metoprolol succinate (TOPROL-XL) 50 MG 24 hr tablet TAKE 1 TABLET BY MOUTH ONCE DAILY WITH A MEAL OR  IMMEDIATELY  FOLLOWING  A  MEAL 90 tablet 3  ? Multiple Vitamin (MULTIVITAMIN WITH MINERALS) TABS tablet Take 1 tablet by mouth daily.    ? omeprazole (PRILOSEC) 20 MG capsule Take 20 mg by mouth daily before breakfast.    ? rosuvastatin (CRESTOR) 10 MG tablet TAKE 1 TABLET BY MOUTH AT BEDTIME 90 tablet 1  ? ?No current facility-administered medications for this visit.  ? ? ?SURGICAL HISTORY:  ?Past Surgical History:  ?Procedure Laterality Date  ? COLONOSCOPY    ? INTERCOSTAL NERVE  BLOCK Right 10/25/2019  ? Procedure: INTERCOSTAL NERVE BLOCK;  Surgeon: [name], MD;  Location: [facility];  Service: Thoracic;  Laterality: Right;  ? MOUTH SURGERY  2002  ? NODE DISSECTION Right 10/25/2019  ? Procedure: NODE DISSECTION;  Surgeon: [name], MD;  Location: [facility];  Service: Thoracic;  Laterality: Right;  ? ROTATOR CUFF REPAIR  10-24-05  ? had bone spur and laceration off cuff. Daldorf  ? XI ROBOTIC ASSISTED THORACOSCOPY- SEGMENTECTOMY Right 10/25/2019  ? Procedure: XI ROBOTIC ASSISTED THORACOSCOPY-SUPERIOR SEGMENTECTOMY;  Surgeon: [name], MD;  Location: [facility] OR;  Service: Thoracic;  Laterality: Right;  ? ? ?REVIEW OF SYSTEMS:  A comprehensive review of systems was negative.  ? ?PHYSICAL EXAMINATION: General appearance:  alert, cooperative, and no distress ?Head: Normocephalic, without obvious abnormality, atraumatic ?Neck: no adenopathy, no JVD, supple, symmetrical, trachea midline, and thyroid not enlarged, symmetric, no tenderness/mass/nodules ?Lymph nodes: Cervical, supraclavicular, and axillary nodes normal. ?Resp: clear to auscultation bilaterally ?Back: symmetric, no curvature. ROM normal. No CVA tenderness. ?Cardio: regular rate and rhythm, S1, S2 normal, no murmur, click, rub or gallop ?GI: soft, non-tender; bowel sounds normal; no masses,  no organomegaly ?Extremities: extremities normal, atraumatic, no cyanosis or edema ? ?ECOG PERFORMANCE STATUS: 1 - Symptomatic but completely ambulatory ? ?Blood pressure 131/61, pulse 82, temperature 97.9 ?F (36.6 ?C), temperature source Tympanic, weight 136 lb 3.2 oz (61.8 kg), SpO2 99 %. ? ?LABORATORY DATA: ?Lab Results  ?Component Value Date  ? WBC 5.2 06/04/2021  ? HGB 12.1 06/04/2021  ? HCT 37.5 06/04/2021  ? MCV 92.4 06/04/2021  ? PLT 235 06/04/2021  ? ? ?  Chemistry   ?   ?Component Value Date/Time  ? NA 139 06/04/2021 0816  ? NA 143 08/25/2018 1043  ? K 4.7 06/04/2021 0816  ? CL 104 06/04/2021 0816  ? CO2 29 06/04/2021 0816  ? BUN 20 06/04/2021 0816  ? BUN 17 08/25/2018 1043  ? CREATININE 1.08 (H) 06/04/2021 0816  ?    ?Component Value Date/Time  ? CALCIUM 9.5 06/04/2021 0816  ? ALKPHOS 61 06/04/2021 0816  ? AST 28 06/04/2021 0816  ? ALT 20 06/04/2021 0816  ? BILITOT 0.4 06/04/2021 0816  ?  ? ? ? ?RADIOGRAPHIC STUDIES: ?CT Chest W Contrast ? ?Result Date: 06/04/2021 ?CLINICAL DATA:  Non-small cell lung cancer restaging * Tracking Code: BO * EXAM: CT CHEST WITH CONTRAST TECHNIQUE: Multidetector CT imaging of the chest was performed during intravenous contrast administration. RADIATION DOSE REDUCTION: This exam was performed according to the departmental dose-optimization program which includes automated exposure control, adjustment of the mA and/or kV according to patient size  and/or use of iterative reconstruction technique. CONTRAST:  55mL OMNIPAQUE IOHEXOL 300 MG/ML  SOLN COMPARISON:  12/05/2020 FINDINGS: Cardiovascular: Aortic atherosclerosis. Normal heart size. Left coronary artery calcifications. No pericardial effusion. Mediastinum/Nodes: No enlarged mediastinal, hilar, or axillary lymph nodes. Thyroid gland, trachea, and esophagus demonstrate no significant findings. Lungs/Pleura: Unchanged postoperative appearance of the right lung with wedge resection of the superior segment right lower lobe. Tiny solid and ground-glass nodules of the bilateral lower lobes are stable and benign. No pleural effusion or pneumothorax. Upper Abdomen: No acute abnormality. Musculoskeletal: No chest wall abnormality. No suspicious osseous lesions identified. IMPRESSION: 1. Unchanged postoperative appearance of the right lung with wedge resection of the superior segment right lower lobe. No evidence of recurrent or metastatic disease. 2. Tiny solid and ground-glass nodules of the bilateral lower lobes are stable and benign. 3.  Coronary artery disease. Aortic Atherosclerosis (ICD10-I70.0). Electronically Signed   By: Delanna Ahmadi M.D.   On: 06/04/2021 09:54   ? ? ?ASSESSMENT AND PLAN: This is a very pleasant 76 years old white female recently diagnosed with a stage IA (T1b, N0, M0) non-small cell lung cancer, adenocarcinoma diagnosed in September 2021 status post right lower lobe superior segmentectomy with lymph node dissection under the care of Dr. Roxan Hockey. ?The patient is currently on observation and she is feeling fine with no concerning complaints. ?She had repeat CT scan of the chest performed recently.  I personally and independently reviewed the scans and discussed the result with the patient today. ?Her scan showed no concerning findings for disease recurrence or metastasis. ?I recommended for her to continue on observation with repeat CT scan of the chest in 6 months. ?She was advised to  call immediately if she has any other concerning symptoms in the interval. ?The patient voices understanding of current disease status and treatment options and is in agreement with the current care plan.

## 2021-06-20 DIAGNOSIS — Z0100 Encounter for examination of eyes and vision without abnormal findings: Secondary | ICD-10-CM | POA: Diagnosis not present

## 2021-06-20 DIAGNOSIS — H43393 Other vitreous opacities, bilateral: Secondary | ICD-10-CM | POA: Diagnosis not present

## 2021-06-21 ENCOUNTER — Other Ambulatory Visit: Payer: Self-pay | Admitting: Family Medicine

## 2021-06-25 ENCOUNTER — Other Ambulatory Visit: Payer: Self-pay | Admitting: Family Medicine

## 2021-08-12 ENCOUNTER — Encounter: Payer: Self-pay | Admitting: Obstetrics & Gynecology

## 2021-08-18 DIAGNOSIS — S93492A Sprain of other ligament of left ankle, initial encounter: Secondary | ICD-10-CM | POA: Diagnosis not present

## 2021-08-20 ENCOUNTER — Other Ambulatory Visit: Payer: Self-pay | Admitting: Obstetrics & Gynecology

## 2021-08-20 DIAGNOSIS — Z1231 Encounter for screening mammogram for malignant neoplasm of breast: Secondary | ICD-10-CM

## 2021-08-22 ENCOUNTER — Other Ambulatory Visit: Payer: Medicare HMO

## 2021-08-27 NOTE — Progress Notes (Signed)
Cardiology Office Note    Date:  09/03/2021   ID:  Teresa Booker, Teresa Booker November 08, 1945, MRN [id number]  PCP:  [name], MD  Cardiologist: Jenkins Rouge, MD    History of Present Illness:   76 y.o. significant partner to our nurse [name]. History of HLD, palpitations with benign PVCls Rx beta blocker. Normal EF and mild MVP no significant MR Prednisone and caffeine make her palpitations worse   Calcium score 04/05/19 109 which is 30 st percentile for age and sex isolated to LAD On crestor with LDL at goal 69 02/14/21    Nodule in superior segment of RLL She eventually had robotic RLL segmentectomy by Dr Roxan Hockey 10/25/19  for T1N0 sage 1A adenocarcinoma  No cardiac symptoms Some paresthesias over surgical incision from thoracotomy  [address] taking trash out and inured left ankle no fracture  Past Medical History:  Diagnosis Date   Alcohol abuse    - abstemious 20 years. Attends AA - strong recovery    Allergic reaction    Allergy    seasonal   Anxiety    COPD (chronic obstructive pulmonary disease) ([facility])    minor   Family history of adverse reaction to anesthesia    mother had problems with n/v   Fibromyalgia    slight   GERD (gastroesophageal reflux disease)    HSV-1 infection    Hyperlipemia    Hypertension    IBS (irritable bowel syndrome)    NSCL Ca dx'd 10/24/20   Postnasal drip    RBBB (right bundle branch block)    Rotator cuff syndrome    right    Past Surgical History:  Procedure Laterality Date   COLONOSCOPY     INTERCOSTAL NERVE BLOCK Right 10/25/2019   Procedure: INTERCOSTAL NERVE BLOCK;  Surgeon: [name], MD;  Location: [facility] OR;  Service: Thoracic;  Laterality: Right;   MOUTH SURGERY  2002   NODE DISSECTION Right 10/25/2019   Procedure: NODE DISSECTION;  Surgeon: [name], MD;  Location: [facility];  Service: Thoracic;  Laterality: Right;   ROTATOR CUFF REPAIR  10-24-05   had bone spur and laceration off cuff. Daldorf   XI  ROBOTIC ASSISTED THORACOSCOPY- SEGMENTECTOMY Right 10/25/2019   Procedure: XI ROBOTIC ASSISTED THORACOSCOPY-SUPERIOR SEGMENTECTOMY;  Surgeon: [name], MD;  Location: [facility] OR;  Service: Thoracic;  Laterality: Right;    Current Medications: Current Meds  Medication Sig   aspirin EC 81 MG tablet Take 81 mg by mouth daily. Swallow whole.   Calcium 200 MG TABS Take 200 mg by mouth every evening.    ECHINACEA COMPLEX PO Take 1 tablet by mouth daily.   hydrOXYzine (ATARAX) 25 MG tablet TAKE 1 TABLET BY MOUTH EVERY 8 HOURS AS NEEDED   hyoscyamine (LEVBID) 0.375 MG 12 hr tablet TAKE ONE TABLET BY MOUTH TWICE A DAY   L-Lysine 500 MG TABS Take 500 mg by mouth daily.   loratadine (CLARITIN) 10 MG tablet Take 1 tablet (10 mg total) by mouth daily.   Magnesium 250 MG TABS Take 250 mg by mouth every evening.    metoprolol succinate (TOPROL-XL) 50 MG 24 hr tablet TAKE 1 TABLET BY MOUTH ONCE DAILY WITH A MEAL OR  IMMEDIATELY  FOLLOWING  A  MEAL   Multiple Vitamin (MULTIVITAMIN WITH MINERALS) TABS tablet Take 1 tablet by mouth daily.   multivitamin-lutein (OCUVITE-LUTEIN) CAPS capsule Take 1 capsule by mouth daily.   omeprazole (PRILOSEC) 20 MG capsule Take 20 mg  by mouth daily before breakfast.   POTASSIUM PO Take 1 tablet by mouth daily.   rosuvastatin (CRESTOR) 10 MG tablet TAKE 1 TABLET BY MOUTH AT BEDTIME     Allergies:   Codeine, Penicillins, Sulfonamide derivatives, and Aspirin   Social History   Socioeconomic History   Marital status: Single    Spouse name: Not on file   Number of children: 0   Years of education: 16   Highest education level: Bachelor's degree (e.g., BA, AB, BS)  Occupational History   Occupation: Freight forwarder    Comment: retired  Tobacco Use   Smoking status: Former    Packs/day: 1.00    Years: 25.00    Total pack years: 25.00    Types: Cigarettes    Quit date: 03/12/1996    Years since quitting: 25.4    Passive exposure: Past   Smokeless tobacco: Never   Vaping Use   Vaping Use: Never used  Substance and Sexual Activity   Alcohol use: No    Comment: former user AA 20+yrs ago strong recovery   Drug use: No   Sexual activity: Not Currently    Partners: Female    Birth control/protection: Post-menopausal    Comment: older than 42, less than 5  Other Topics Concern   Not on file  Social History Narrative   03/30/19:   [employer]   Long-term relationship - '95   1 cat   Social Determinants of Health   Financial Resource Strain: Low Risk  (04/02/2021)   Overall Financial Resource Strain (CARDIA)    Difficulty of Paying Living Expenses: Not hard at all  Food Insecurity: No Food Insecurity (04/02/2021)   Hunger Vital Sign    Worried About Running Out of Food in the Last Year: Never true    [place] in the Last Year: Never true  Transportation Needs: No Transportation Needs (04/02/2021)   PRAPARE - Hydrologist (Medical): No    Lack of Transportation (Non-Medical): No  Physical Activity: Insufficiently Active (04/02/2021)   Exercise Vital Sign    Days of Exercise per Week: 4 days    Minutes of Exercise per Session: 30 min  Stress: No Stress Concern Present (04/02/2021)   [place]    Feeling of Stress : Only a little  Social Connections: Moderately Integrated (05/01/2020)   Social Connection and Isolation Panel [NHANES]    Frequency of Communication with Friends and Family: More than three times a week    Frequency of Social Gatherings with Friends and Family: Never    Attends Religious Services: Never    Marine scientist or Organizations: Yes    Attends Music therapist: Never    Marital Status: Living with partner     Family History:  The patient's   family history includes Aneurysm in her mother and another family member; Brain cancer in her brother; Cancer in her brother; Cancer (age of onset:  72) in her brother; Coronary artery disease in her mother; Diabetes in her brother; Heart attack in her mother; Hyperlipidemia in her mother; Irritable bowel syndrome in her mother; Kidney cancer in her brother; Lung cancer in her brother and another family member; Stroke in her father and another family member.   ROS:   Please see the history of present illness.    ROS All other systems reviewed and are negative.   PHYSICAL EXAM:  VS:  BP (!) 144/70   Pulse 66   Ht 5\' 6"  (1.676 m)   Wt 130 lb (59 kg)   SpO2 98%   BMI 20.98 kg/m    Affect appropriate Healthy:  appears stated age 33: normal Neck supple with no adenopathy JVP normal no bruits no thyromegaly Lungs clear post right thoracotomy robotic  Heart:  S1/S2 no murmur, no rub, gallop or click PMI normal Abdomen: benighn, BS positve, no tenderness, no AAA no bruit.  No HSM or HJR Distal pulses intact with no bruits No edema Neuro non-focal Skin warm and dry No muscular weakness  Wt Readings from Last 3 Encounters:  09/03/21 130 lb (59 kg)  06/07/21 136 lb 3.2 oz (61.8 kg)  05/30/21 135 lb (61.2 kg)      Studies/Labs Reviewed:   EKG:   08/25/18 NSR rate 76 nonspecific ST changes with frequent PVC's 09/03/2021 SR rate 66 normal   Recent Labs: 06/04/2021: ALT 20; BUN 20; Creatinine 1.08; Hemoglobin 12.1; Platelet Count 235; Potassium 4.7; Sodium 139   Lipid Panel    Component Value Date/Time   CHOL 142 02/14/2021 0807   TRIG 104 02/14/2021 0807   HDL 54 02/14/2021 0807   CHOLHDL 2.6 02/14/2021 0807   CHOLHDL 2.7 10/05/2015 0758   VLDL 28 10/05/2015 0758   LDLCALC 69 02/14/2021 0807    Additional studies/ records that were reviewed today include:  NST 05/06/2017  Nuclear stress EF: 70%. There was no ST segment deviation noted during stress. Blood pressure demonstrated a hypertensive response to exercise. The study is normal. This is a low risk study. The left ventricular ejection fraction is  hyperdynamic (>65%).   Normal exercise nuclear stress test with no evidence for prior infarct or ischemia.  Normal LVEF. Poor exercise capacity. Hypertensive response to exercise.   2D echo 3/26/2019Study Conclusions   - Left ventricle: The cavity size was normal. Systolic function was   normal. The estimated ejection fraction was in the range of 60%   to 65%. Wall motion was normal; there were no regional wall   motion abnormalities. Left ventricular diastolic function   parameters were normal. - Mitral valve: Mild, late systolicprolapse, involving the anterior   leaflet. There was no significant regurgitation. - Pulmonary arteries: PA peak pressure: 32 mm Hg (S).   Holter monitor 04/2017 Sinus rhythm PAC;s/ PVC;s No significant arrhythmias       ASSESSMENT:    Palpitations, HLD, Pulmonary Nodule   PLAN:  In order of problems listed above:   Palpitations Benign in setting of no obstructive CAD normal EF RX beta blockers with improvement Limit Caffeine   Hyperlipidemia on Crestor LDL at goal  Pulmonary: F/U Dr Valeta Harms and Roxan Hockey Post segmental lobectomy on right for adenocarcinoma stage 1AN0 Chest CT 06/04/21 unchanged post op no new lesions    F/U with me in a year   Signed, Jenkins Rouge, MD  09/03/2021 10:08 AM    [address], [place], [place]  [address] Phone: [phone]; Fax: [phone]

## 2021-08-30 ENCOUNTER — Other Ambulatory Visit: Payer: Self-pay | Admitting: Cardiovascular Disease

## 2021-09-03 ENCOUNTER — Ambulatory Visit: Payer: Medicare HMO | Admitting: Cardiovascular Disease

## 2021-09-03 ENCOUNTER — Other Ambulatory Visit: Payer: Medicare HMO

## 2021-09-03 ENCOUNTER — Encounter: Payer: Self-pay | Admitting: Cardiovascular Disease

## 2021-09-03 VITALS — BP 144/70 | HR 66 | Ht 66.0 in | Wt 130.0 lb

## 2021-09-03 DIAGNOSIS — E785 Hyperlipidemia, unspecified: Secondary | ICD-10-CM | POA: Diagnosis not present

## 2021-09-03 DIAGNOSIS — C3491 Malignant neoplasm of unspecified part of right bronchus or lung: Secondary | ICD-10-CM

## 2021-09-03 DIAGNOSIS — R002 Palpitations: Secondary | ICD-10-CM | POA: Diagnosis not present

## 2021-09-03 LAB — LIPID PANEL
Chol/HDL Ratio: 2.5 ratio (ref 0.0–4.4)
Cholesterol, Total: 135 mg/dL (ref 100–199)
HDL: 55 mg/dL (ref >39)
LDL Chol Calc (NIH): 64 mg/dL (ref 0–99)
Triglycerides: 85 mg/dL (ref 0–149)
VLDL Cholesterol Cal: 16 mg/dL (ref 5–40)

## 2021-09-03 LAB — HEPATIC FUNCTION PANEL
ALT: 16 IU/L (ref 0–32)
AST: 26 IU/L (ref 0–40)
Albumin: 4.4 g/dL (ref 3.8–4.8)
Alkaline Phosphatase: 79 IU/L (ref 44–121)
Bilirubin Total: 0.3 mg/dL (ref 0.0–1.2)
Bilirubin, Direct: 0.11 mg/dL (ref 0.00–0.40)
Total Protein: 6.6 g/dL (ref 6.0–8.5)

## 2021-09-03 NOTE — Patient Instructions (Signed)
Medication Instructions:  Your physician recommends that you continue on your current medications as directed. Please refer to the Current Medication list given to you today.  *If you need a refill on your cardiac medications before your next appointment, please call your pharmacy*  Lab Work: If you have labs (blood work) drawn today and your tests are completely normal, you will receive your results only by: [place] (if you have MyChart) OR A paper copy in the mail If you have any lab test that is abnormal or we need to change your treatment, we will call you to review the results.  Testing/Procedures: None ordered today.  Follow-Up: At [facility], you and your health needs are our priority.  As part of our continuing mission to provide you with exceptional heart care, we have created designated Provider Care Teams.  These Care Teams include your primary Cardiologist (physician) and Advanced Practice Providers (APPs -  Physician Assistants and Nurse Practitioners) who all work together to provide you with the care you need, when you need it.  We recommend signing up for the patient portal called "MyChart".  Sign up information is provided on this After Visit Summary.  MyChart is used to connect with patients for Virtual Visits (Telemedicine).  Patients are able to view lab/test results, encounter notes, upcoming appointments, etc.  Non-urgent messages can be sent to your provider as well.   To learn more about what you can do with MyChart, go to [email or web address].    Your next appointment:   1 year(s)  The format for your next appointment:   In Person  Provider:   Jenkins Rouge, MD {   Important Information About Sugar

## 2021-09-04 ENCOUNTER — Other Ambulatory Visit: Payer: Self-pay | Admitting: Cardiovascular Disease

## 2021-09-04 IMAGING — NM NM HEPATO W/GB/PHARM/[PERSON_NAME]
2 series · 12 of 12 positions shown · non-contrast
Comparison: None.

CLINICAL DATA: Right upper quadrant pain

EXAM:
NUCLEAR MEDICINE HEPATOBILIARY IMAGING WITH GALLBLADDER EF
VIEWS:
Anterior right quadrant
RADIOPHARMACEUTICALS:  5.1 mCi Pc-MMm  Choletec IV

[Series 1: biliary · 3.25mm/px · 6 of 60 frames shown]
[frame 6/60]
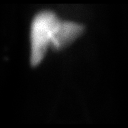
[frame 16/60]
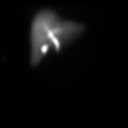
[frame 26/60]
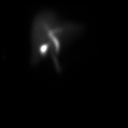
[frame 36/60]
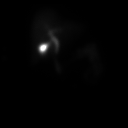
[frame 46/60]
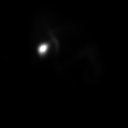
[frame 56/60]
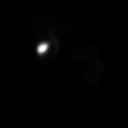

[Series 2: gbef · 3.25mm/px · 6 of 60 frames shown]
[frame 6/60]
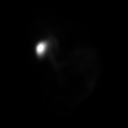
[frame 16/60]
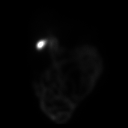
[frame 26/60]
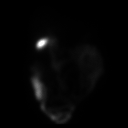
[frame 36/60]
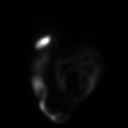
[frame 46/60]
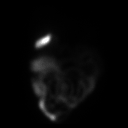
[frame 56/60]
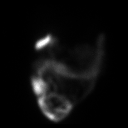

[12 of 12 positions shown; findings below may reference images not displayed]

FINDINGS: Liver uptake of radiotracer is unremarkable. There is prompt
visualization of gallbladder and small bowel, indicating patency of
the cystic and common bile ducts. Patient consumed 8 ounces of
Ensure orally with calculation of the computer generated ejection
fraction of radiotracer from the gallbladder. The patient did not
experience clinical symptoms with the oral Ensure consumption. The
computer generated ejection fraction of radiotracer from the
gallbladder is normal at 86%, normal greater than 33% using the oral
agent.
IMPRESSION: Study within normal limits.

## 2021-09-07 ENCOUNTER — Other Ambulatory Visit: Payer: Self-pay | Admitting: Family Medicine

## 2021-09-10 DIAGNOSIS — S93402D Sprain of unspecified ligament of left ankle, subsequent encounter: Secondary | ICD-10-CM | POA: Diagnosis not present

## 2021-09-24 ENCOUNTER — Other Ambulatory Visit: Payer: Self-pay | Admitting: Cardiovascular Disease

## 2021-09-25 ENCOUNTER — Other Ambulatory Visit: Payer: Self-pay

## 2021-09-30 ENCOUNTER — Other Ambulatory Visit: Payer: Self-pay | Admitting: Family Medicine

## 2021-10-02 ENCOUNTER — Other Ambulatory Visit: Payer: Self-pay | Admitting: Family Medicine

## 2021-10-02 MED ORDER — HYOSCYAMINE SULFATE ER 0.375 MG PO TB12
0.3750 mg | ORAL_TABLET | Freq: Two times a day (BID) | ORAL | 0 refills | Status: DC
Start: 2021-10-02 — End: 2021-12-31

## 2021-10-03 ENCOUNTER — Telehealth: Payer: Self-pay

## 2021-10-03 ENCOUNTER — Telehealth: Payer: Self-pay | Admitting: Internal Medicine

## 2021-10-03 NOTE — Telephone Encounter (Signed)
Called patient regarding upcoming 2024 appointment, left a voicemail. 

## 2021-10-03 NOTE — Telephone Encounter (Signed)
I initiated a PA for Patient's Hyoscyamine 0.375 and this medication is not covered under the patient's Medicare Part D. Key:  Teresa Booker. Please advise

## 2021-10-04 NOTE — Telephone Encounter (Signed)
I spoke with the patient and she stated she does not use insurance and uses [facility]. Patient reported that she has already contacted pharmacy and they are working on getting rx for her

## 2021-10-04 NOTE — Telephone Encounter (Signed)
Left a message for the pt to return my call.  

## 2021-10-05 ENCOUNTER — Ambulatory Visit
Admission: RE | Admit: 2021-10-05 | Discharge: 2021-10-05 | Disposition: A | Discharge status: Home / Self Care | Payer: Medicare HMO | Source: Ambulatory Visit | Attending: Obstetrics & Gynecology | Admitting: Obstetrics & Gynecology

## 2021-10-05 DIAGNOSIS — Z1231 Encounter for screening mammogram for malignant neoplasm of breast: Secondary | ICD-10-CM

## 2021-10-23 ENCOUNTER — Ambulatory Visit (INDEPENDENT_AMBULATORY_CARE_PROVIDER_SITE_OTHER): Payer: Medicare HMO | Admitting: Family Medicine

## 2021-10-23 ENCOUNTER — Encounter: Payer: Self-pay | Admitting: Family Medicine

## 2021-10-23 VITALS — BP 128/60 | HR 76 | Temp 98.1°F | Wt 129.1 lb

## 2021-10-23 DIAGNOSIS — R519 Headache, unspecified: Secondary | ICD-10-CM | POA: Diagnosis not present

## 2021-10-23 DIAGNOSIS — J019 Acute sinusitis, unspecified: Secondary | ICD-10-CM

## 2021-10-23 LAB — POC COVID19 BINAXNOW: SARS Coronavirus 2 Ag: NEGATIVE

## 2021-10-23 MED ORDER — CEFUROXIME AXETIL 500 MG PO TABS: 500.0000 mg | ORAL_TABLET | Freq: Two times a day (BID) | ORAL | 0 refills | Status: AC

## 2021-10-23 NOTE — Progress Notes (Signed)
   Subjective:    Patient ID: Teresa Booker, female    DOB: 1945/10/02, 76 y.o.   MRN: [id number]  HPI Here for 4 days of sinus congestion, headache, and PND. No fever or ST or cough. Taking Claritin and Excedrin. She has tested negative for the Covid virus here today.    Review of Systems  Constitutional: Negative.   HENT:  Positive for congestion, postnasal drip and sinus pressure. Negative for ear pain and sore throat.   Eyes: Negative.   Respiratory: Negative.         Objective:   Physical Exam Constitutional:      Appearance: Normal appearance. She is not ill-appearing.  HENT:     Right Ear: Tympanic membrane, ear canal and external ear normal.     Left Ear: Tympanic membrane, ear canal and external ear normal.     Nose: Nose normal.     Mouth/Throat:     Pharynx: Oropharynx is clear.  Eyes:     Conjunctiva/sclera: Conjunctivae normal.  Pulmonary:     Effort: Pulmonary effort is normal.     Breath sounds: Normal breath sounds.  Lymphadenopathy:     Cervical: No cervical adenopathy.  Neurological:     Mental Status: She is alert.           Assessment & Plan:  Sinusitis, treat with 10 days of Cefuroxime.  Alysia Penna, MD

## 2021-10-27 ENCOUNTER — Encounter: Payer: Self-pay | Admitting: Family Medicine

## 2021-12-07 ENCOUNTER — Inpatient Hospital Stay: Payer: Medicare HMO | Attending: Internal Medicine

## 2021-12-07 ENCOUNTER — Ambulatory Visit (HOSPITAL_COMMUNITY)
Admission: RE | Admit: 2021-12-07 | Discharge: 2021-12-07 | Disposition: A | Discharge status: Home / Self Care | Payer: Medicare HMO | Source: Ambulatory Visit | Attending: Internal Medicine | Admitting: Internal Medicine

## 2021-12-07 DIAGNOSIS — C3490 Malignant neoplasm of unspecified part of unspecified bronchus or lung: Secondary | ICD-10-CM

## 2021-12-07 DIAGNOSIS — Z85118 Personal history of other malignant neoplasm of bronchus and lung: Secondary | ICD-10-CM | POA: Insufficient documentation

## 2021-12-07 DIAGNOSIS — R918 Other nonspecific abnormal finding of lung field: Secondary | ICD-10-CM | POA: Diagnosis not present

## 2021-12-07 LAB — CMP (CANCER CENTER ONLY)
ALT: 17 U/L (ref 0–44)
AST: 23 U/L (ref 15–41)
Albumin: 3.9 g/dL (ref 3.5–5.0)
Alkaline Phosphatase: 56 U/L (ref 38–126)
Anion gap: 4 — ABNORMAL LOW (ref 5–15)
BUN: 16 mg/dL (ref 8–23)
CO2: 30 mmol/L (ref 22–32)
Calcium: 9.1 mg/dL (ref 8.9–10.3)
Chloride: 104 mmol/L (ref 98–111)
Creatinine: 0.9 mg/dL (ref 0.44–1.00)
GFR, Estimated: >60 mL/min (ref >60)
Glucose, Bld: 100 mg/dL — ABNORMAL HIGH (ref 70–99)
Potassium: 4 mmol/L (ref 3.5–5.1)
Sodium: 138 mmol/L (ref 135–145)
Total Bilirubin: 0.4 mg/dL (ref 0.3–1.2)
Total Protein: 6.8 g/dL (ref 6.5–8.1)

## 2021-12-07 LAB — CBC WITH DIFFERENTIAL (CANCER CENTER ONLY)
Abs Immature Granulocytes: 0.04 10*3/uL (ref 0.00–0.07)
Basophils Absolute: 0.1 10*3/uL (ref 0.0–0.1)
Basophils Relative: 1 %
Eosinophils Absolute: 0.1 10*3/uL (ref 0.0–0.5)
Eosinophils Relative: 2 %
HCT: 35.2 % — ABNORMAL LOW (ref 36.0–46.0)
Hemoglobin: 11.6 g/dL — ABNORMAL LOW (ref 12.0–15.0)
Immature Granulocytes: 1 %
Lymphocytes Relative: 25 %
Lymphs Abs: 1.7 10*3/uL (ref 0.7–4.0)
MCH: 30.9 pg (ref 26.0–34.0)
MCHC: 33 g/dL (ref 30.0–36.0)
MCV: 93.6 fL (ref 80.0–100.0)
Monocytes Absolute: 0.6 10*3/uL (ref 0.1–1.0)
Monocytes Relative: 8 %
Neutro Abs: 4.6 10*3/uL (ref 1.7–7.7)
Neutrophils Relative %: 63 %
Platelet Count: 232 10*3/uL (ref 150–400)
RBC: 3.76 MIL/uL — ABNORMAL LOW (ref 3.87–5.11)
RDW: 12.5 % (ref 11.5–15.5)
WBC Count: 7.1 10*3/uL (ref 4.0–10.5)
nRBC: 0 % (ref 0.0–0.2)

## 2021-12-07 MED ORDER — SODIUM CHLORIDE (PF) 0.9 % IJ SOLN
INTRAMUSCULAR | Status: AC
  Filled 2021-12-07: qty 50

## 2021-12-07 MED ORDER — IOHEXOL 300 MG/ML  SOLN
75.0000 mL | Freq: Once | INTRAMUSCULAR | Status: AC | PRN
Start: 2021-12-07 — End: 2021-12-07
  Administered 2021-12-07: 75 mL via INTRAVENOUS

## 2021-12-09 ENCOUNTER — Telehealth: Payer: Self-pay | Admitting: Internal Medicine

## 2021-12-09 NOTE — Telephone Encounter (Signed)
Rescheduled 10/31 appointment to 11/01 due provider on-call, patient has been called and patient is notified.

## 2021-12-10 ENCOUNTER — Other Ambulatory Visit: Payer: Self-pay | Admitting: Family Medicine

## 2021-12-11 ENCOUNTER — Ambulatory Visit: Payer: Medicare HMO | Admitting: Internal Medicine

## 2021-12-12 ENCOUNTER — Inpatient Hospital Stay: Payer: Medicare HMO | Attending: Internal Medicine | Admitting: Internal Medicine

## 2021-12-12 ENCOUNTER — Other Ambulatory Visit: Payer: Self-pay

## 2021-12-12 VITALS — BP 121/88 | HR 73 | Temp 97.9°F | Resp 15 | Ht 66.0 in | Wt 130.6 lb

## 2021-12-12 DIAGNOSIS — Z85118 Personal history of other malignant neoplasm of bronchus and lung: Secondary | ICD-10-CM | POA: Insufficient documentation

## 2021-12-12 DIAGNOSIS — Z08 Encounter for follow-up examination after completed treatment for malignant neoplasm: Secondary | ICD-10-CM | POA: Insufficient documentation

## 2021-12-12 DIAGNOSIS — C3490 Malignant neoplasm of unspecified part of unspecified bronchus or lung: Secondary | ICD-10-CM | POA: Diagnosis not present

## 2021-12-12 NOTE — Progress Notes (Signed)
[facility] Telephone:([phone]) [phone]   Fax:([phone]  OFFICE PROGRESS NOTE  [name], MD [address] [place] [address]  DIAGNOSIS: Stage IA (T1b, N0, M0) non-small cell lung cancer, adenocarcinoma.  She presented with a right lower lobe pulmonary nodule.  She was diagnosed in September 2021.  PRIOR THERAPY: Status post robotic assisted right thoracoscopy with right lower lobe superior segmentectomy and lymph node dissection under the care of Dr. Roxan Hockey on 10/25/2019.  CURRENT THERAPY: Observation.  INTERVAL HISTORY: Teresa Booker 76 y.o. female returns to the clinic today for follow-up visit.  The patient is feeling fine today with no concerning complaints.  She denied having any chest pain, shortness of breath, cough or hemoptysis.  She denied having any fever or chills.  She has no nausea, vomiting, diarrhea or constipation.  She has no headache or visual changes.  She denied having any recent weight loss or night sweats.  She is here today for evaluation with repeat CT scan of the chest for restaging of her disease.  MEDICAL HISTORY: Past Medical History:  Diagnosis Date   Alcohol abuse    - abstemious 20 years. Attends AA - strong recovery    Allergic reaction    Allergy    seasonal   Anxiety    COPD (chronic obstructive pulmonary disease) (HCC)    minor   Family history of adverse reaction to anesthesia    mother had problems with n/v   Fibromyalgia    slight   GERD (gastroesophageal reflux disease)    HSV-1 infection    Hyperlipemia    Hypertension    IBS (irritable bowel syndrome)    NSCL Ca dx'd 10/24/20   Postnasal drip    RBBB (right bundle branch block)    Rotator cuff syndrome    right    ALLERGIES:  is allergic to codeine, penicillins, sulfonamide derivatives, and aspirin.  MEDICATIONS:  Current Outpatient Medications  Medication Sig Dispense Refill   aspirin EC 81 MG tablet Take 81 mg by mouth daily.  Swallow whole.     Calcium 200 MG TABS Take 200 mg by mouth every evening.      ECHINACEA COMPLEX PO Take 1 tablet by mouth daily.     hydrOXYzine (ATARAX) 25 MG tablet TAKE 1 TABLET BY MOUTH EVERY 8 HOURS AS NEEDED 90 tablet 0   hyoscyamine (LEVBID) 0.375 MG 12 hr tablet Take 1 tablet (0.375 mg total) by mouth 2 (two) times daily. 180 tablet 0   L-Lysine 500 MG TABS Take 500 mg by mouth daily.     loratadine (CLARITIN) 10 MG tablet Take 1 tablet (10 mg total) by mouth daily. 90 tablet 3   Magnesium 250 MG TABS Take 250 mg by mouth every evening.      metoprolol succinate (TOPROL-XL) 50 MG 24 hr tablet Take 1 tablet (50 mg total) by mouth daily. WITH MEAL OR  IMMEDIATELY  FOLLOWING  A  MEAL 90 tablet 3   Multiple Vitamin (MULTIVITAMIN WITH MINERALS) TABS tablet Take 1 tablet by mouth daily.     multivitamin-lutein (OCUVITE-LUTEIN) CAPS capsule Take 1 capsule by mouth daily.     omeprazole (PRILOSEC) 20 MG capsule Take 20 mg by mouth daily before breakfast.     POTASSIUM PO Take 1 tablet by mouth daily.     rosuvastatin (CRESTOR) 10 MG tablet TAKE 1 TABLET BY MOUTH AT BEDTIME 90 tablet 3   No current facility-administered medications  for this visit.    SURGICAL HISTORY:  Past Surgical History:  Procedure Laterality Date   COLONOSCOPY     INTERCOSTAL NERVE BLOCK Right 10/25/2019   Procedure: INTERCOSTAL NERVE BLOCK;  Surgeon: [name], MD;  Location: [facility] OR;  Service: Thoracic;  Laterality: Right;   MOUTH SURGERY  2002   NODE DISSECTION Right 10/25/2019   Procedure: NODE DISSECTION;  Surgeon: [name], MD;  Location: [facility] OR;  Service: Thoracic;  Laterality: Right;   ROTATOR CUFF REPAIR  10-24-05   had bone spur and laceration off cuff. Teresa Booker   XI ROBOTIC ASSISTED THORACOSCOPY- SEGMENTECTOMY Right 10/25/2019   Procedure: XI ROBOTIC ASSISTED THORACOSCOPY-SUPERIOR SEGMENTECTOMY;  Surgeon: [name], MD;  Location: [facility] OR;  Service: Thoracic;  Laterality:  Right;    REVIEW OF SYSTEMS:  A comprehensive review of systems was negative.   PHYSICAL EXAMINATION: General appearance: alert, cooperative, and no distress Head: Normocephalic, without obvious abnormality, atraumatic Neck: no adenopathy, no JVD, supple, symmetrical, trachea midline, and thyroid not enlarged, symmetric, no tenderness/mass/nodules Lymph nodes: Cervical, supraclavicular, and axillary nodes normal. Resp: clear to auscultation bilaterally Back: symmetric, no curvature. ROM normal. No CVA tenderness. Cardio: regular rate and rhythm, S1, S2 normal, no murmur, click, rub or gallop GI: soft, non-tender; bowel sounds normal; no masses,  no organomegaly Extremities: extremities normal, atraumatic, no cyanosis or edema  ECOG PERFORMANCE STATUS: 1 - Symptomatic but completely ambulatory  Blood pressure 121/88, pulse 73, temperature 97.9 F (36.6 C), temperature source Oral, resp. rate 15, height 5\' 6"  (1.676 m), weight 130 lb 9.6 oz (59.2 kg), SpO2 98 %.  LABORATORY DATA: Lab Results  Component Value Date   WBC 7.1 12/07/2021   HGB 11.6 (L) 12/07/2021   HCT 35.2 (L) 12/07/2021   MCV 93.6 12/07/2021   PLT 232 12/07/2021      Chemistry      Component Value Date/Time   NA 138 12/07/2021 0856   NA 143 08/25/2018 1043   K 4.0 12/07/2021 0856   CL 104 12/07/2021 0856   CO2 30 12/07/2021 0856   BUN 16 12/07/2021 0856   BUN 17 08/25/2018 1043   CREATININE 0.90 12/07/2021 0856      Component Value Date/Time   CALCIUM 9.1 12/07/2021 0856   ALKPHOS 56 12/07/2021 0856   AST 23 12/07/2021 0856   ALT 17 12/07/2021 0856   BILITOT 0.4 12/07/2021 0856       RADIOGRAPHIC STUDIES: CT Chest W Contrast  Result Date: 12/10/2021 CLINICAL DATA:  Non-small cell lung cancer restaging, status post right lower lobe wedge resection * Tracking Code: BO * EXAM: CT CHEST WITH CONTRAST TECHNIQUE: Multidetector CT imaging of the chest was performed during intravenous contrast  administration. RADIATION DOSE REDUCTION: This exam was performed according to the departmental dose-optimization program which includes automated exposure control, adjustment of the mA and/or kV according to patient size and/or use of iterative reconstruction technique. CONTRAST:  61mL OMNIPAQUE IOHEXOL 300 MG/ML  SOLN COMPARISON:  06/04/2021 FINDINGS: Cardiovascular: Aortic atherosclerosis. Normal heart size. Left coronary artery calcifications. No pericardial effusion. Mediastinum/Nodes: No enlarged mediastinal, hilar, or axillary lymph nodes. Thyroid gland, trachea, and esophagus demonstrate no significant findings. Lungs/Pleura: Unchanged postoperative appearance of the right lung with wedge resection of the superior segment right lower lobe (series 7, image 83). Occasional tiny ground-glass nodules of the bilateral lung bases are stable and benign, no specific further follow-up or characterization required. No pleural effusion or pneumothorax. Upper Abdomen: No acute abnormality. Musculoskeletal:  No chest wall abnormality. No acute osseous findings. IMPRESSION: 1. Unchanged postoperative appearance of the right lung with wedge resection of the superior segment right lower lobe. 2. No evidence of recurrent or metastatic disease in the chest. 3. Coronary artery disease. Aortic Atherosclerosis (ICD10-I70.0). Electronically Signed   By: Delanna Ahmadi M.D.   On: 12/10/2021 12:15     ASSESSMENT AND PLAN: This is a very pleasant 76 years old white female recently diagnosed with a stage IA (T1b, N0, M0) non-small cell lung cancer, adenocarcinoma diagnosed in September 2021 status post right lower lobe superior segmentectomy with lymph node dissection under the care of Dr. Roxan Hockey. The patient is currently on observation and she is feeling fine with no concerning complaints. She had repeat CT scan of the chest performed recently.  I personally and independently reviewed the scan and discussed the result with  the patient today. Her scan showed no concerning findings for disease recurrence or metastasis. I recommended to continue on observation with repeat CT scan of the chest in 1 year. She was advised to call immediately if she has any other concerning symptoms in the interval. The patient voices understanding of current disease status and treatment options and is in agreement with the current care plan.  All questions were answered. The patient knows to call the clinic with any problems, questions or concerns. We can certainly see the patient much sooner if necessary.   Disclaimer: This note was dictated with voice recognition software. Similar sounding words can inadvertently be transcribed and may not be corrected upon review.

## 2021-12-25 ENCOUNTER — Telehealth: Payer: Medicare HMO | Admitting: Physician Assistant

## 2021-12-25 DIAGNOSIS — B9789 Other viral agents as the cause of diseases classified elsewhere: Secondary | ICD-10-CM

## 2021-12-25 DIAGNOSIS — J019 Acute sinusitis, unspecified: Secondary | ICD-10-CM | POA: Diagnosis not present

## 2021-12-25 MED ORDER — PREDNISONE 20 MG PO TABS
40.0000 mg | ORAL_TABLET | Freq: Every day | ORAL | 0 refills | Status: DC
Start: 2021-12-25 — End: 2022-03-29

## 2021-12-25 NOTE — Progress Notes (Signed)
E-Visit for Sinus Problems  We are sorry that you are not feeling well.  Here is how we plan to help!  Based on what you have shared with me it looks like you have sinusitis.  Sinusitis is inflammation and infection in the sinus cavities of the head.  Based on your presentation I believe you most likely have Acute Viral Sinusitis.This is an infection most likely caused by a virus. There is not specific treatment for viral sinusitis other than to help you with the symptoms until the infection runs its course.  You may use an oral decongestant such as Mucinex D or if you have glaucoma or high blood pressure use plain Mucinex. Saline nasal spray help and can safely be used as often as needed for congestion, I have prescribed: a few day course of steroid to reduce sinus inflammation and alleviate symptoms.  Some authorities believe that zinc sprays or the use of Echinacea may shorten the course of your symptoms.  Sinus infections are not as easily transmitted as other respiratory infection, however we still recommend that you avoid close contact with loved ones, especially the very young and elderly.  Remember to wash your hands thoroughly throughout the day as this is the number one way to prevent the spread of infection!  Home Care: Only take medications as instructed by your medical team. Do not take these medications with alcohol. A steam or ultrasonic humidifier can help congestion.  You can place a towel over your head and breathe in the steam from hot water coming from a faucet. Avoid close contacts especially the very young and the elderly. Cover your mouth when you cough or sneeze. Always remember to wash your hands.  Get Help Right Away If: You develop worsening fever or sinus pain. You develop a severe head ache or visual changes. Your symptoms persist after you have completed your treatment plan.  Make sure you Understand these instructions. Will watch your condition. Will get help  right away if you are not doing well or get worse.   Thank you for choosing an e-visit.  Your e-visit answers were reviewed by a board certified advanced clinical practitioner to complete your personal care plan. Depending upon the condition, your plan could have included both over the counter or prescription medications.  Please review your pharmacy choice. Make sure the pharmacy is open so you can pick up prescription now. If there is a problem, you may contact your provider through [employer] and have the prescription routed to another pharmacy.  Your safety is important to [place]. If you have drug allergies check your prescription carefully.   For the next 24 hours you can use MyChart to ask questions about today's visit, request a non-urgent call back, or ask for a work or school excuse. You will get an email in the next two days asking about your experience. I hope that your e-visit has been valuable and will speed your recovery.

## 2021-12-25 NOTE — Progress Notes (Signed)
I have spent 5 minutes in review of e-visit questionnaire, review and updating patient chart, medical decision making and response to patient.   Lorana Maffeo Cody Aniaya Bacha, PA-C    

## 2021-12-29 ENCOUNTER — Other Ambulatory Visit: Payer: Self-pay | Admitting: Family Medicine

## 2022-01-01 ENCOUNTER — Other Ambulatory Visit: Payer: Self-pay | Admitting: Family Medicine

## 2022-01-01 NOTE — Telephone Encounter (Signed)
Rx done. 

## 2022-01-02 ENCOUNTER — Encounter: Payer: Self-pay | Admitting: Gastroenterology

## 2022-03-15 ENCOUNTER — Encounter: Payer: Self-pay | Admitting: Family Medicine

## 2022-03-28 ENCOUNTER — Encounter: Payer: Self-pay | Admitting: Family Medicine

## 2022-03-29 ENCOUNTER — Encounter: Payer: Self-pay | Admitting: Family Medicine

## 2022-03-29 ENCOUNTER — Ambulatory Visit (INDEPENDENT_AMBULATORY_CARE_PROVIDER_SITE_OTHER): Payer: Medicare HMO | Admitting: Family Medicine

## 2022-03-29 VITALS — BP 144/60 | HR 72 | Temp 97.4°F | Ht 66.0 in | Wt 135.1 lb

## 2022-03-29 DIAGNOSIS — R0981 Nasal congestion: Secondary | ICD-10-CM

## 2022-03-29 DIAGNOSIS — R519 Headache, unspecified: Secondary | ICD-10-CM | POA: Diagnosis not present

## 2022-03-29 LAB — POC COVID19 BINAXNOW: SARS Coronavirus 2 Ag: NEGATIVE

## 2022-03-29 MED ORDER — CEFUROXIME AXETIL 250 MG PO TABS: ORAL_TABLET | ORAL | 0 refills | Status: DC

## 2022-03-29 MED ORDER — CEFUROXIME AXETIL 250 MG PO TABS: 250.0000 mg | ORAL_TABLET | Freq: Two times a day (BID) | ORAL | 0 refills | Status: AC

## 2022-03-29 NOTE — Progress Notes (Signed)
Established Patient Office Visit  Subjective   Patient ID: Teresa Booker, female    DOB: Jan 22, 1946  Age: 77 y.o. MRN: [id number]  Chief Complaint  Patient presents with   Sinusitis    Patient complains of sinusitis, x1 week, Tried Claratin with little relief   Sore Throat    Patient complains of sore throat, x1 week     HPI   Teresa Booker is seen with concerns for possible sinus infection.  She has had some nasal congestion for over a week with intermittent headaches.  May have had some low-grade fevers initially.  None now.  Clear nasal mucus.  She has had couple of aphthous ulcers this week and has had those in the past.  Saw her dentist and was prescribed acyclovir.  No bloody or purulent nasal discharge.  No cough.  Her partner had norovirus couple weeks ago.  Past Medical History:  Diagnosis Date   Alcohol abuse    - abstemious 20 years. Attends AA - strong recovery    Allergic reaction    Allergy    seasonal   Anxiety    COPD (chronic obstructive pulmonary disease) (HCC)    minor   Family history of adverse reaction to anesthesia    mother had problems with n/v   Fibromyalgia    slight   GERD (gastroesophageal reflux disease)    HSV-1 infection    Hyperlipemia    Hypertension    IBS (irritable bowel syndrome)    NSCL Ca dx'd 10/24/20   Postnasal drip    RBBB (right bundle branch block)    Rotator cuff syndrome    right   Past Surgical History:  Procedure Laterality Date   COLONOSCOPY     INTERCOSTAL NERVE BLOCK Right 10/25/2019   Procedure: INTERCOSTAL NERVE BLOCK;  Surgeon: [name], MD;  Location: [facility] OR;  Service: Thoracic;  Laterality: Right;   MOUTH SURGERY  2002   NODE DISSECTION Right 10/25/2019   Procedure: NODE DISSECTION;  Surgeon: [name], MD;  Location: [facility];  Service: Thoracic;  Laterality: Right;   ROTATOR CUFF REPAIR  10-24-05   had bone spur and laceration off cuff. [place]   XI ROBOTIC ASSISTED THORACOSCOPY- SEGMENTECTOMY  Right 10/25/2019   Procedure: XI ROBOTIC ASSISTED THORACOSCOPY-SUPERIOR SEGMENTECTOMY;  Surgeon: [name], MD;  Location: [facility];  Service: Thoracic;  Laterality: Right;    reports that she quit smoking about 26 years ago. Her smoking use included cigarettes. She has a 25.00 pack-year smoking history. She has been exposed to tobacco smoke. She has never used smokeless tobacco. She reports that she does not drink alcohol and does not use drugs. family history includes Aneurysm in her mother and another family member; Brain cancer in her brother; Cancer in her brother; Cancer (age of onset: 106) in her brother; Coronary artery disease in her mother; Diabetes in her brother; Heart attack in her mother; Hyperlipidemia in her mother; Irritable bowel syndrome in her mother; Kidney cancer in her brother; Lung cancer in her brother and another family member; Stroke in her father and another family member. Allergies  Allergen Reactions   Codeine     Makes heart race   Penicillins     unknown   Sulfonamide Derivatives     Has a rash with the use of silvadiene for burn   Aspirin     Upset stomach, High dose only    Review of Systems  Constitutional:  Negative for chills and  fever.  HENT:  Positive for congestion and sinus pain.   Respiratory:  Negative for cough.   Cardiovascular:  Negative for chest pain.  Neurological:  Positive for headaches.      Objective:     BP (!) 144/60 (BP Location: Left Arm, Patient Position: Sitting, Cuff Size: Normal)   Pulse 72   Temp (!) 97.4 F (36.3 C) (Oral)   Ht 5\' 6"  (1.676 m)   Wt 135 lb 1.6 oz (61.3 kg)   SpO2 96%   BMI 21.81 kg/m    Physical Exam Vitals reviewed.  Constitutional:      General: She is not in acute distress.    Appearance: She is not ill-appearing.  HENT:     Right Ear: Tympanic membrane normal.     Left Ear: Tympanic membrane normal.     Mouth/Throat:     Comments: Small aphthous ulcer mid lower lip.  No atypical  features.  No other intraoral lesions noted. Cardiovascular:     Rate and Rhythm: Normal rate and regular rhythm.  Pulmonary:     Effort: Pulmonary effort is normal.     Breath sounds: Normal breath sounds.  Musculoskeletal:     Cervical back: Neck supple.  Lymphadenopathy:     Cervical: No cervical adenopathy.  Neurological:     Mental Status: She is alert.      Results for orders placed or performed in visit on 03/29/22  POC COVID-19  Result Value Ref Range   SARS Coronavirus 2 Ag Negative Negative      The 10-year ASCVD risk score (Arnett DK, et al., 2019) is: 22.4%    Assessment & Plan:   Problem List Items Addressed This Visit   None Visit Diagnoses     Sinus congestion    -  Primary   Relevant Orders   POC COVID-19 (Completed)   Acute nonintractable headache, unspecified headache type       Relevant Orders   POC COVID-19 (Completed)     COVID screen negative.  She has sinus congestive symptoms and suspect probably viral URI.  Small aphthous ulcer without any atypical features.  Recommend over-the-counter medications and of observation for now.  Follow-up for any recurrent fever or worsening symptoms.  She was concerned about worsening sinusitis.  We did print prescription for Ceftin 250 mg twice daily for 7 days if she has any progressive facial pain, purulent secretions, etc.  No follow-ups on file.    Carolann Littler, MD

## 2022-04-01 ENCOUNTER — Other Ambulatory Visit: Payer: Self-pay | Admitting: Family Medicine

## 2022-04-08 ENCOUNTER — Ambulatory Visit: Payer: Medicare HMO

## 2022-04-12 ENCOUNTER — Ambulatory Visit (INDEPENDENT_AMBULATORY_CARE_PROVIDER_SITE_OTHER): Payer: Medicare HMO | Admitting: Family Medicine

## 2022-04-12 ENCOUNTER — Encounter: Payer: Self-pay | Admitting: Family Medicine

## 2022-04-12 VITALS — Ht 66.0 in | Wt 135.1 lb

## 2022-04-12 DIAGNOSIS — Z0000 Encounter for general adult medical examination without abnormal findings: Secondary | ICD-10-CM | POA: Diagnosis not present

## 2022-04-12 NOTE — Progress Notes (Unsigned)
Patient ID: Teresa Booker, female   DOB: [birth year], 77 y.o.   MRN: [id number]   Virtual Visit via Telephone Note  I connected with Teresa Booker on 04/12/22 at 10:30 AM EST by telephone and verified that I am speaking with the correct person using two identifiers.   I discussed the limitations, risks, security and privacy concerns of performing an evaluation and management service by telephone and the availability of in person appointments. I also discussed with the patient that there may be a patient responsible charge related to this service. The patient expressed understanding and agreed to proceed.  Location patient: home Location provider: work or home office Participants present for the call: patient, provider Patient did not have a visit in the prior 7 days to address this/these issue(s).   History of Present Illness: Teresa Booker is here for Medicare wellness visit  1.  Risk factors based on Past Medical , Social, and Family history reviewed and as indicated above with no changes  Past medical history reviewed.  She has history of stage I adenocarcinoma right lung treated with surgery back in September 2021.  She had recent CT scan in October which was normal.  No recent cough or other concerning symptoms.  She has history of allergic rhinitis, IBS, hyperlipidemia.  She takes Crestor and also Toprol which she has taken in the past for palpitations.  Social history-she has a steady partner who she has lived with for years.  She has been retired for 6 years.  She swims about 7-8 laps several days per week.  She is in recovery about 30 years from alcohol abuse.  Quit smoking 1998.  Family history-Sister with Alzheimer's age 63 just diagnosed.  Father had stroke and died age 66.  Mother with history of brain aneurysm and history of CAD in her 51s.  She had a brother that had type 2 diabetes and small cell lung cancer who is deceased.  Another brother who had liver cancer and is alive.  She has another  sister with history of glaucoma and macular degeneration.  2.  Limitations in physical activities None.  No recent falls.  Swims several days per week for exercise.  Some walking.  3.  Depression/mood No active depression or anxiety issues PHQ 2 equals 0.  4.  Hearing mild-moderate deficits.  She has had hearing tested recently and they suggested she consider hearing aids.  5.  ADLs independent in all.  6.  Cognitive function (orientation to time and place, language, writing, speech,memory) no short or long term memory issues.  Language and judgement intact.  No problems with short-term recall  7.  Home Safety no issues except cat under feet .  No recent falls.  8.  Height, weight, and visual acuity.all stable.  -She gets her eyes examined yearly including dilated eye exam and pressure checks.  Wt Readings from Last 3 Encounters:  04/12/22 135 lb 1.6 oz (61.3 kg)  03/29/22 135 lb 1.6 oz (61.3 kg)  12/12/21 130 lb 9.6 oz (59.2 kg)    9.  Counseling discussed -  Counseled regarding age and gender appropriate preventative screenings and immunizations.  10. Recommendation of preventive services.   -We recommended she consider Prevnar 20.  She declines.  She apparently had previous reaction with Pneumovax -She had Zostavax but no history of Shingrix.  She will consider -She is considering RSV vaccine  11. Labs based on risk factors-none indicated at this time.  Needs follow-up lipids this summer.  She  had recent CBC and CMP per oncology which were stable  12. Care Plan-as below  13. Other Providers-Dr. Earlie Server, oncology   Dr. Johnsie Cancel, cardiology Dr Dellis Filbert- gyn  71. Written schedule of screening/prevention services given to patient.  Health Maintenance  Topic Date Due   COVID-19 Vaccine (6 - 2023-24 season) 01/28/2022   Medicare Annual Wellness (AWV)  04/02/2022   Zoster Vaccines- Shingrix (1 of 2) 07/13/2022 (Originally 06/07/1964)   Pneumonia Vaccine 34+ Years old (2 of 2 -  PCV) 04/12/2023 (Originally 06/06/2012)   MAMMOGRAM  10/06/2022   COLONOSCOPY (Pts 45-78yr Insurance coverage will need to be confirmed)  10/19/2025   DTaP/Tdap/Td (3 - Tdap) 11/19/2028   INFLUENZA VACCINE  Completed   DEXA SCAN  Completed   Hepatitis C Screening  Completed   HPV VACCINES  Aged Out      Observations/Objective: Patient sounds cheerful and well on the phone. I do not appreciate any SOB. Speech and thought processing are grossly intact. Patient reported vitals:  Assessment and Plan:  Annual Medicare wellness visit  Follow Up Instructions:    [id number] 11-20 [id number] 21-30 I did not refer this patient for an OV in the next 24 hours for this/these issue(s).  I discussed the assessment and treatment plan with the patient. The patient was provided an opportunity to ask questions and all were answered. The patient agreed with the plan and demonstrated an understanding of the instructions.   The patient was advised to call back or seek an in-person evaluation if the symptoms worsen or if the condition fails to improve as anticipated.  I provided 30 minutes of non-face-to-face time during this encounter.   BCarolann Littler MD

## 2022-05-05 ENCOUNTER — Encounter: Payer: Self-pay | Admitting: Family Medicine

## 2022-05-23 ENCOUNTER — Ambulatory Visit (INDEPENDENT_AMBULATORY_CARE_PROVIDER_SITE_OTHER): Payer: Medicare HMO | Admitting: Family Medicine

## 2022-05-23 ENCOUNTER — Encounter: Payer: Self-pay | Admitting: Family Medicine

## 2022-05-23 VITALS — BP 142/60 | HR 73 | Temp 98.2°F | Ht 66.0 in | Wt 134.6 lb

## 2022-05-23 DIAGNOSIS — L03113 Cellulitis of right upper limb: Secondary | ICD-10-CM

## 2022-05-23 DIAGNOSIS — S60561A Insect bite (nonvenomous) of right hand, initial encounter: Secondary | ICD-10-CM

## 2022-05-23 DIAGNOSIS — L308 Other specified dermatitis: Secondary | ICD-10-CM

## 2022-05-23 DIAGNOSIS — W57XXXA Bitten or stung by nonvenomous insect and other nonvenomous arthropods, initial encounter: Secondary | ICD-10-CM | POA: Diagnosis not present

## 2022-05-23 MED ORDER — DOXYCYCLINE HYCLATE 100 MG PO TABS: 100.0000 mg | ORAL_TABLET | Freq: Two times a day (BID) | ORAL | 0 refills | Status: AC

## 2022-05-23 MED ORDER — PREDNISONE 10 MG PO TABS: ORAL_TABLET | ORAL | 0 refills | Status: DC

## 2022-05-23 NOTE — Progress Notes (Signed)
Established Patient Office Visit   Subjective  Patient ID: Teresa Booker, female    DOB: [birth year]  Age: 77 y.o. MRN: [id number]  Chief Complaint  Patient presents with   Poison Ivy    Patient complains of poison ivy on right hand, x1 day, Patient reports she was doing lawn work yesterday when reaction occurred   Insect Bite    Patient complains of possible insect bite on right hand, x1 day, Tried Prednisone     Patient is a 77 year old female with pmh sig for atherosclerosis of the aorta, HLD, RBBB, adenocarcinoma right lung, IBS who is followed by Dr. Caryl Never and seen for acute concern.  Patient endorses noticing redness on dorsum of right hand yesterday evening after coming in from gardening.  Patient is unsure if she was bitten by anything or came in contact with poison ivy.  Hand is pruritic, edematous, mildly erythematous.  Patient denies fever, chills, nausea, vomiting.  Has allergies to penicillin and sulfa antibiotics.  Last Tdap 11/20/2018.    Patient Active Problem List   Diagnosis Date Noted   Hx of adenomatous colonic polyps 07/19/2020   Adenocarcinoma of lung, stage 1, right 10/28/2019   S/P robot-assisted surgical procedure 10/25/2019   Hyperlipidemia 08/31/2014   Elevated BP 11/18/2012   Abdominal pain, RUQ 08/14/2010   ABDOMINAL BRUIT 06/27/2008   RIGHT BUNDLE BRANCH BLOCK 06/24/2008   Allergic rhinitis 09/09/2007   IBS 08/13/2007   ROTATOR CUFF SYNDROME, RIGHT 08/13/2007   Elevated lipids 06/24/2007   Past Surgical History:  Procedure Laterality Date   COLONOSCOPY     INTERCOSTAL NERVE BLOCK Right 10/25/2019   Procedure: INTERCOSTAL NERVE BLOCK;  Surgeon: Loreli Slot, MD;  Location: [facility] OR;  Service: Thoracic;  Laterality: Right;   MOUTH SURGERY  2002   NODE DISSECTION Right 10/25/2019   Procedure: NODE DISSECTION;  Surgeon: Loreli Slot, MD;  Location: [facility] OR;  Service: Thoracic;  Laterality: Right;   ROTATOR CUFF REPAIR  10-24-05   had bone  spur and laceration off cuff. Daldorf   XI ROBOTIC ASSISTED THORACOSCOPY- SEGMENTECTOMY Right 10/25/2019   Procedure: XI ROBOTIC ASSISTED THORACOSCOPY-SUPERIOR SEGMENTECTOMY;  Surgeon: Loreli Slot, MD;  Location: [facility] OR;  Service: Thoracic;  Laterality: Right;   Social History   Tobacco Use   Smoking status: Former    Packs/day: 1.00    Years: 25.00    Additional pack years: 0.00    Total pack years: 25.00    Types: Cigarettes    Quit date: 03/12/1996    Years since quitting: 26.2    Passive exposure: Past   Smokeless tobacco: Never  Vaping Use   Vaping Use: Never used  Substance Use Topics   Alcohol use: No    Comment: former user AA 20+yrs ago strong recovery   Drug use: No   Family History  Problem Relation Age of Onset   Hyperlipidemia Mother    Irritable bowel syndrome Mother    Coronary artery disease Mother    Heart attack Mother    Aneurysm Mother        brain   Stroke Father    Diabetes Brother    Cancer Brother        small cell lung ca with mets/ smoker   Brain cancer Brother    Lung cancer Brother    Cancer Brother 39       liver cancer ?primary   Kidney cancer Brother    Stroke Other  Aneurysm Other    Lung cancer Other    Colon cancer Neg Hx    Esophageal cancer Neg Hx    Pancreatic cancer Neg Hx    Stomach cancer Neg Hx    Allergies  Allergen Reactions   Codeine     Makes heart race   Penicillins     unknown   Sulfonamide Derivatives     Has a rash with the use of silvadiene for burn   Aspirin     Upset stomach, High dose only      ROS Negative unless stated above    Objective:     BP (!) 142/60 (BP Location: Left Arm, Patient Position: Sitting, Cuff Size: Normal)   Pulse 73   Temp 98.2 F (36.8 C) (Oral)   Ht 5\' 6"  (1.676 m)   Wt 134 lb 9.6 oz (61.1 kg)   SpO2 98%   BMI 21.73 kg/m  BP Readings from Last 3 Encounters:  03/29/22 (!) 144/60  12/12/21 121/88  10/23/21 128/60      Physical Exam Constitutional:       Appearance: Normal appearance.  HENT:     Head: Normocephalic and atraumatic.     Nose: Nose normal.     Mouth/Throat:     Mouth: Mucous membranes are moist.  Eyes:     Extraocular Movements: Extraocular movements intact.     Conjunctiva/sclera: Conjunctivae normal.     Pupils: Pupils are equal, round, and reactive to light.  Cardiovascular:     Rate and Rhythm: Normal rate and regular rhythm.     Heart sounds: Normal heart sounds.  Pulmonary:     Effort: Pulmonary effort is normal.     Breath sounds: Normal breath sounds.  Musculoskeletal:        General: Swelling present.  Skin:    General: Skin is warm.     Findings: Erythema present.       Neurological:     Mental Status: She is alert.         No results found for any visits on 05/23/22.    Assessment & Plan:  Cellulitis of right hand -New problem -     predniSONE; Take 4 tabs every morning for 3 days, 3 tabs for 2 days, 2 tabs for 2 days, 1 tab for 1 day.  Dispense: 23 tablet; Refill: 0 -     Doxycycline Hyclate; Take 1 tablet (100 mg total) by mouth 2 (two) times daily for 7 days.  Dispense: 14 tablet; Refill: 0  Pruritic dermatitis -New problem -     predniSONE; Take 4 tabs every morning for 3 days, 3 tabs for 2 days, 2 tabs for 2 days, 1 tab for 1 day.  Dispense: 23 tablet; Refill: 0  Insect bite of right hand, initial encounter -New problem   Insect bite 1 day ago to right hand.  Start ABX for cellulitis.  Prednisone taper.  Given precautions.   Return if symptoms worsen or fail to improve.   Deeann Saint, MD

## 2022-06-05 DIAGNOSIS — K589 Irritable bowel syndrome without diarrhea: Secondary | ICD-10-CM | POA: Diagnosis not present

## 2022-06-05 DIAGNOSIS — K219 Gastro-esophageal reflux disease without esophagitis: Secondary | ICD-10-CM | POA: Diagnosis not present

## 2022-06-05 DIAGNOSIS — Z8249 Family history of ischemic heart disease and other diseases of the circulatory system: Secondary | ICD-10-CM | POA: Diagnosis not present

## 2022-06-05 DIAGNOSIS — K224 Dyskinesia of esophagus: Secondary | ICD-10-CM | POA: Diagnosis not present

## 2022-06-05 DIAGNOSIS — Z87891 Personal history of nicotine dependence: Secondary | ICD-10-CM | POA: Diagnosis not present

## 2022-06-05 DIAGNOSIS — E785 Hyperlipidemia, unspecified: Secondary | ICD-10-CM | POA: Diagnosis not present

## 2022-06-05 DIAGNOSIS — I2510 Atherosclerotic heart disease of native coronary artery without angina pectoris: Secondary | ICD-10-CM | POA: Diagnosis not present

## 2022-06-05 DIAGNOSIS — R69 Illness, unspecified: Secondary | ICD-10-CM | POA: Diagnosis not present

## 2022-06-05 DIAGNOSIS — J302 Other seasonal allergic rhinitis: Secondary | ICD-10-CM | POA: Diagnosis not present

## 2022-06-05 DIAGNOSIS — I10 Essential (primary) hypertension: Secondary | ICD-10-CM | POA: Diagnosis not present

## 2022-06-05 DIAGNOSIS — J439 Emphysema, unspecified: Secondary | ICD-10-CM | POA: Diagnosis not present

## 2022-06-17 ENCOUNTER — Other Ambulatory Visit: Payer: Self-pay | Admitting: Family Medicine

## 2022-06-25 ENCOUNTER — Other Ambulatory Visit: Payer: Self-pay | Admitting: Cardiovascular Disease

## 2022-06-30 ENCOUNTER — Encounter: Payer: Self-pay | Admitting: Family Medicine

## 2022-07-01 ENCOUNTER — Other Ambulatory Visit: Payer: Self-pay | Admitting: Family

## 2022-07-01 MED ORDER — HYOSCYAMINE SULFATE ER 0.375 MG PO TB12: 0.3750 mg | ORAL_TABLET | Freq: Two times a day (BID) | ORAL | 0 refills | Status: DC

## 2022-07-03 ENCOUNTER — Ambulatory Visit: Payer: Medicare HMO | Admitting: Podiatry

## 2022-07-03 ENCOUNTER — Ambulatory Visit (INDEPENDENT_AMBULATORY_CARE_PROVIDER_SITE_OTHER): Payer: Medicare HMO

## 2022-07-03 DIAGNOSIS — M7751 Other enthesopathy of right foot: Secondary | ICD-10-CM

## 2022-07-03 DIAGNOSIS — M21611 Bunion of right foot: Secondary | ICD-10-CM | POA: Diagnosis not present

## 2022-07-03 DIAGNOSIS — M21612 Bunion of left foot: Secondary | ICD-10-CM

## 2022-07-03 DIAGNOSIS — M7752 Other enthesopathy of left foot: Secondary | ICD-10-CM

## 2022-07-03 NOTE — Progress Notes (Signed)
Chief Complaint  Patient presents with   Bunions    Patient came in today for bilateral bunions and hammertoes, and numbness in the forefoot and toes, patient denies any pain, X-Rays done today    Subjective: 77 y.o. female presents today as a new patient for evaluation of minimally symptomatic bunions to the bilateral feet.  Patient states that she has had bunions for over 40 years but they do not really bother her very much.  She wears wide shoes which seems to alleviate her symptoms and problems.  She is concerned due to crossover deformity of the second digit.  She is also getting some retrograde pressure and tenderness to the second toes.  She did get a pair of orthotics/insoles from her chiropractor for $400 and they helped minimally.  Presenting for further treatment evaluation  Past Medical History:  Diagnosis Date   Alcohol abuse    - abstemious 20 years. Attends AA - strong recovery    Allergic reaction    Allergy    seasonal   Anxiety    COPD (chronic obstructive pulmonary disease) (HCC)    minor   Family history of adverse reaction to anesthesia    mother had problems with n/v   Fibromyalgia    slight   GERD (gastroesophageal reflux disease)    HSV-1 infection    Hyperlipemia    Hypertension    IBS (irritable bowel syndrome)    NSCL Ca dx'd 10/24/20   Postnasal drip    RBBB (right bundle branch block)    Rotator cuff syndrome    right    Past Surgical History:  Procedure Laterality Date   COLONOSCOPY     INTERCOSTAL NERVE BLOCK Right 10/25/2019   Procedure: INTERCOSTAL NERVE BLOCK;  Surgeon: Loreli Slot, MD;  Location: [facility] OR;  Service: Thoracic;  Laterality: Right;   MOUTH SURGERY  2002   NODE DISSECTION Right 10/25/2019   Procedure: NODE DISSECTION;  Surgeon: Loreli Slot, MD;  Location: [facility] OR;  Service: Thoracic;  Laterality: Right;   ROTATOR CUFF REPAIR  10-24-05   had bone spur and laceration off cuff. Daldorf   XI ROBOTIC ASSISTED  THORACOSCOPY- SEGMENTECTOMY Right 10/25/2019   Procedure: XI ROBOTIC ASSISTED THORACOSCOPY-SUPERIOR SEGMENTECTOMY;  Surgeon: Loreli Slot, MD;  Location: [facility] OR;  Service: Thoracic;  Laterality: Right;    Allergies  Allergen Reactions   Codeine     Makes heart race   Penicillins     unknown   Sulfonamide Derivatives     Has a rash with the use of silvadiene for burn   Aspirin     Upset stomach, High dose only     Objective: Physical Exam General: The patient is alert and oriented x3 in no acute distress.  Dermatology: Skin is cool, dry and supple bilateral lower extremities. Negative for open lesions or macerations.  Vascular: Palpable pedal pulses bilaterally. No edema or erythema noted. Capillary refill within normal limits.  Neurological: Epicritic and protective threshold grossly intact bilaterally.   Musculoskeletal Exam: Clinical evidence of bunion deformity noted to the respective foot. There is moderate pain on palpation range of motion of the first MPJ. Lateral deviation of the hallux noted consistent with hallux abductovalgus.  There is some slight crossover deformity of the second digit with retrograde pressure to the second MTP bilateral.  Radiographic Exam B/L feet 07/03/2022: Normal osseous mineralization.  No acute fractures identified.  Increased intermetatarsal angle greater than 15 with a hallux abductus angle greater  than 30 noted on AP view.   Assessment: 1.  Hallux valgus bilateral  -Patient was evaluated. X-Rays reviewed. -Currently the bunions are minimally symptomatic and she is able to function on a daily basis without any limiting factors due to the bunions.  Recommend continued conservative care for now.  Continue wide fitting shoes -I do believe the patient would benefit from truly custom molded orthotics to support the medial longitudinal arch of the foot and offload pressure especially from the metatarsals.  This was discussed today.   Currently the patient is wearing Foot Levelers insoles which do not contour to the plantar arch of the foot well for support the medial longitudinal arch. -Patient will contact our office if she would like to proceed with custom molded orthotics.  Order placed for custom orthotics.      Felecia Shelling, DPM [facility]  Dr. Felecia Shelling, DPM    [address] [place], [place] [address]                Office [phone]  Fax [phone]

## 2022-07-09 ENCOUNTER — Encounter: Payer: Self-pay | Admitting: Family Medicine

## 2022-07-10 MED ORDER — ACYCLOVIR 400 MG PO TABS: ORAL_TABLET | ORAL | 0 refills | Status: DC

## 2022-07-12 ENCOUNTER — Other Ambulatory Visit: Payer: Self-pay | Admitting: Podiatry

## 2022-07-12 ENCOUNTER — Other Ambulatory Visit: Payer: Self-pay | Admitting: Family Medicine

## 2022-07-12 DIAGNOSIS — M7751 Other enthesopathy of right foot: Secondary | ICD-10-CM

## 2022-07-12 DIAGNOSIS — M21611 Bunion of right foot: Secondary | ICD-10-CM

## 2022-07-31 DIAGNOSIS — L539 Erythematous condition, unspecified: Secondary | ICD-10-CM | POA: Diagnosis not present

## 2022-07-31 DIAGNOSIS — T63461A Toxic effect of venom of wasps, accidental (unintentional), initial encounter: Secondary | ICD-10-CM | POA: Diagnosis not present

## 2022-07-31 DIAGNOSIS — R600 Localized edema: Secondary | ICD-10-CM | POA: Diagnosis not present

## 2022-08-08 DIAGNOSIS — H43393 Other vitreous opacities, bilateral: Secondary | ICD-10-CM | POA: Diagnosis not present

## 2022-08-21 ENCOUNTER — Other Ambulatory Visit: Payer: Self-pay | Admitting: Cardiovascular Disease

## 2022-08-27 ENCOUNTER — Other Ambulatory Visit: Payer: Self-pay | Admitting: Family Medicine

## 2022-08-27 DIAGNOSIS — Z0000 Encounter for general adult medical examination without abnormal findings: Secondary | ICD-10-CM

## 2022-09-29 ENCOUNTER — Other Ambulatory Visit: Payer: Self-pay | Admitting: Cardiovascular Disease

## 2022-10-03 ENCOUNTER — Other Ambulatory Visit: Payer: Self-pay | Admitting: Family

## 2022-10-07 ENCOUNTER — Ambulatory Visit
Admission: RE | Admit: 2022-10-07 | Discharge: 2022-10-07 | Disposition: A | Discharge status: Home / Self Care | Payer: Medicare HMO | Source: Ambulatory Visit | Attending: Family Medicine | Admitting: Family Medicine

## 2022-10-07 DIAGNOSIS — Z0000 Encounter for general adult medical examination without abnormal findings: Secondary | ICD-10-CM

## 2022-10-07 DIAGNOSIS — Z1231 Encounter for screening mammogram for malignant neoplasm of breast: Secondary | ICD-10-CM | POA: Diagnosis not present

## 2022-10-08 ENCOUNTER — Encounter: Payer: Self-pay | Admitting: Family Medicine

## 2022-10-08 MED ORDER — HYOSCYAMINE SULFATE ER 0.375 MG PO TB12: 0.3750 mg | ORAL_TABLET | Freq: Two times a day (BID) | ORAL | 0 refills | Status: DC

## 2022-10-15 ENCOUNTER — Other Ambulatory Visit: Payer: Self-pay | Admitting: Cardiovascular Disease

## 2022-10-17 ENCOUNTER — Other Ambulatory Visit: Payer: Self-pay | Admitting: Cardiovascular Disease

## 2022-10-17 DIAGNOSIS — Z0100 Encounter for examination of eyes and vision without abnormal findings: Secondary | ICD-10-CM | POA: Diagnosis not present

## 2022-11-16 ENCOUNTER — Encounter: Payer: Self-pay | Admitting: Family Medicine

## 2022-11-20 ENCOUNTER — Telehealth (INDEPENDENT_AMBULATORY_CARE_PROVIDER_SITE_OTHER): Payer: Medicare HMO | Admitting: Family Medicine

## 2022-11-20 VITALS — Ht 66.0 in | Wt 130.0 lb

## 2022-11-20 DIAGNOSIS — J019 Acute sinusitis, unspecified: Secondary | ICD-10-CM

## 2022-11-20 MED ORDER — DOXYCYCLINE HYCLATE 100 MG PO CAPS: 100.0000 mg | ORAL_CAPSULE | Freq: Two times a day (BID) | ORAL | 0 refills | Status: DC

## 2022-11-20 NOTE — Progress Notes (Signed)
Patient ID: Teresa Booker, female   DOB: December 28, 1945, 77 y.o.   MRN: [id number]   Virtual Visit via Video Note  I connected with Renae Fickle on 11/20/22 at  5:00 PM EDT by a video enabled telemedicine application and verified that I am speaking with the correct person using two identifiers.  Location patient: home Location provider:work or home office Persons participating in the virtual visit: patient, provider  I discussed the limitations of evaluation and management by telemedicine and the availability of in person appointments. The patient expressed understanding and agreed to proceed.   HPI: Ricki is seen with greater than 1 week history of fatigue, sore throat, intermittent headaches, frontal sinus pressure, occasional dry cough.  She has had some bodyaches.  No fever.  Home COVID test negative x 2.  She is tried over-the-counter saline without much improvement.  Has battled sinus infections similarly in the past.  Allergy to penicillin and sulfa.  She feels worse than she did a week ago when it first started.   ROS: See pertinent positives and negatives per HPI.  Past Medical History:  Diagnosis Date   Alcohol abuse    - abstemious 20 years. Attends AA - strong recovery    Allergic reaction    Allergy    seasonal   Anxiety    COPD (chronic obstructive pulmonary disease) (HCC)    minor   Family history of adverse reaction to anesthesia    mother had problems with n/v   Fibromyalgia    slight   GERD (gastroesophageal reflux disease)    HSV-1 infection    Hyperlipemia    Hypertension    IBS (irritable bowel syndrome)    NSCL Ca dx'd 10/24/20   Postnasal drip    RBBB (right bundle branch block)    Rotator cuff syndrome    right    Past Surgical History:  Procedure Laterality Date   COLONOSCOPY     INTERCOSTAL NERVE BLOCK Right 10/25/2019   Procedure: INTERCOSTAL NERVE BLOCK;  Surgeon: Loreli Slot, MD;  Location: [facility] OR;  Service: Thoracic;  Laterality: Right;    MOUTH SURGERY  2002   NODE DISSECTION Right 10/25/2019   Procedure: NODE DISSECTION;  Surgeon: Loreli Slot, MD;  Location: [facility] OR;  Service: Thoracic;  Laterality: Right;   ROTATOR CUFF REPAIR  10-24-05   had bone spur and laceration off cuff. Daldorf   XI ROBOTIC ASSISTED THORACOSCOPY- SEGMENTECTOMY Right 10/25/2019   Procedure: XI ROBOTIC ASSISTED THORACOSCOPY-SUPERIOR SEGMENTECTOMY;  Surgeon: Loreli Slot, MD;  Location: [facility] OR;  Service: Thoracic;  Laterality: Right;    Family History  Problem Relation Age of Onset   Hyperlipidemia Mother    Irritable bowel syndrome Mother    Coronary artery disease Mother    Heart attack Mother    Aneurysm Mother        brain   Stroke Father    Diabetes Brother    Cancer Brother        small cell lung ca with mets/ smoker   Brain cancer Brother    Lung cancer Brother    Cancer Brother 46       liver cancer ?primary   Kidney cancer Brother    Stroke Other    Aneurysm Other    Lung cancer Other    Colon cancer Neg Hx    Esophageal cancer Neg Hx    Pancreatic cancer Neg Hx    Stomach cancer Neg Hx  SOCIAL HX: Ex-smoker   Current Outpatient Medications:    acyclovir (ZOVIRAX) 400 MG tablet, Take 1 tablet daily as needed for cold sores, Disp: 30 tablet, Rfl: 0   aspirin EC 81 MG tablet, Take 81 mg by mouth daily. Swallow whole., Disp: , Rfl:    Calcium 200 MG TABS, Take 200 mg by mouth every evening. , Disp: , Rfl:    ECHINACEA COMPLEX PO, Take 1 tablet by mouth daily., Disp: , Rfl:    hydrOXYzine (ATARAX) 25 MG tablet, TAKE 1 TABLET BY MOUTH EVERY 8 HOURS AS NEEDED, Disp: 90 tablet, Rfl: 0   hyoscyamine (LEVBID) 0.375 MG 12 hr tablet, Take 1 tablet (0.375 mg total) by mouth 2 (two) times daily., Disp: 180 tablet, Rfl: 0   L-Lysine 500 MG TABS, Take 500 mg by mouth daily., Disp: , Rfl:    loratadine (CLARITIN) 10 MG tablet, Take 1 tablet (10 mg total) by mouth daily., Disp: 90 tablet, Rfl: 3   Magnesium 250 MG TABS,  Take 250 mg by mouth every evening. , Disp: , Rfl:    metoprolol succinate (TOPROL-XL) 50 MG 24 hr tablet, TAKE 1 TABLET BY MOUTH ONCE DAILY WITH  OR  IMMEDIATELY  FOLLOWING  MEAL, Disp: 90 tablet, Rfl: 0   Multiple Vitamin (MULTIVITAMIN WITH MINERALS) TABS tablet, Take 1 tablet by mouth daily., Disp: , Rfl:    omeprazole (PRILOSEC) 20 MG capsule, Take 20 mg by mouth daily before breakfast., Disp: , Rfl:    POTASSIUM PO, Take 1 tablet by mouth daily., Disp: , Rfl:    predniSONE (DELTASONE) 10 MG tablet, Take 4 tabs every morning for 3 days, 3 tabs for 2 days, 2 tabs for 2 days, 1 tab for 1 day. (Patient taking differently: as needed. Take 4 tabs every morning for 3 days, 3 tabs for 2 days, 2 tabs for 2 days, 1 tab for 1 day.), Disp: 23 tablet, Rfl: 0   rosuvastatin (CRESTOR) 10 MG tablet, TAKE 1 TABLET BY MOUTH AT BEDTIME, Disp: 90 tablet, Rfl: 0  EXAM:  VITALS per patient if applicable:  GENERAL: alert, oriented, appears well and in no acute distress  HEENT: atraumatic, conjunttiva clear, no obvious abnormalities on inspection of external nose and ears  NECK: normal movements of the head and neck  LUNGS: on inspection no signs of respiratory distress, breathing rate appears normal, no obvious gross SOB, gasping or wheezing  CV: no obvious cyanosis  MS: moves all visible extremities without noticeable abnormality  PSYCH/NEURO: pleasant and cooperative, no obvious depression or anxiety, speech and thought processing grossly intact  ASSESSMENT AND PLAN:  Discussed the following assessment and plan:  Acute sinusitis.  Discussed challenges differentiating viral versus bacterial.  She is concerned because of progressive symptoms.  Will start doxycycline 100 mg twice daily for 10 days.  Well-hydrated.  Follow-up for any persistent or worsening symptoms.     I discussed the assessment and treatment plan with the patient. The patient was provided an opportunity to ask questions and all were  answered. The patient agreed with the plan and demonstrated an understanding of the instructions.   The patient was advised to call back or seek an in-person evaluation if the symptoms worsen or if the condition fails to improve as anticipated.     Evelena Peat, MD

## 2022-12-06 DIAGNOSIS — R69 Illness, unspecified: Secondary | ICD-10-CM | POA: Diagnosis not present

## 2022-12-11 ENCOUNTER — Inpatient Hospital Stay: Payer: Medicare HMO | Attending: Internal Medicine

## 2022-12-11 ENCOUNTER — Ambulatory Visit (HOSPITAL_COMMUNITY)
Admission: RE | Admit: 2022-12-11 | Discharge: 2022-12-11 | Disposition: A | Discharge status: Home / Self Care | Payer: Medicare HMO | Source: Ambulatory Visit | Attending: Internal Medicine | Admitting: Internal Medicine

## 2022-12-11 DIAGNOSIS — C3490 Malignant neoplasm of unspecified part of unspecified bronchus or lung: Secondary | ICD-10-CM | POA: Insufficient documentation

## 2022-12-11 DIAGNOSIS — J439 Emphysema, unspecified: Secondary | ICD-10-CM | POA: Diagnosis not present

## 2022-12-11 DIAGNOSIS — I700 Atherosclerosis of aorta: Secondary | ICD-10-CM | POA: Diagnosis not present

## 2022-12-11 DIAGNOSIS — Z85118 Personal history of other malignant neoplasm of bronchus and lung: Secondary | ICD-10-CM | POA: Insufficient documentation

## 2022-12-11 DIAGNOSIS — R918 Other nonspecific abnormal finding of lung field: Secondary | ICD-10-CM | POA: Diagnosis not present

## 2022-12-11 LAB — CMP (CANCER CENTER ONLY)
ALT: 22 U/L (ref 0–44)
AST: 28 U/L (ref 15–41)
Albumin: 4.2 g/dL (ref 3.5–5.0)
Alkaline Phosphatase: 53 U/L (ref 38–126)
Anion gap: 6 (ref 5–15)
BUN: 16 mg/dL (ref 8–23)
CO2: 30 mmol/L (ref 22–32)
Calcium: 9.7 mg/dL (ref 8.9–10.3)
Chloride: 105 mmol/L (ref 98–111)
Creatinine: 1.01 mg/dL — ABNORMAL HIGH (ref 0.44–1.00)
GFR, Estimated: 58 mL/min — ABNORMAL LOW (ref >60)
Glucose, Bld: 102 mg/dL — ABNORMAL HIGH (ref 70–99)
Potassium: 4.5 mmol/L (ref 3.5–5.1)
Sodium: 141 mmol/L (ref 135–145)
Total Bilirubin: 0.5 mg/dL (ref 0.3–1.2)
Total Protein: 6.9 g/dL (ref 6.5–8.1)

## 2022-12-11 LAB — CBC WITH DIFFERENTIAL (CANCER CENTER ONLY)
Abs Immature Granulocytes: 0.01 10*3/uL (ref 0.00–0.07)
Basophils Absolute: 0.1 10*3/uL (ref 0.0–0.1)
Basophils Relative: 1 %
Eosinophils Absolute: 0.2 10*3/uL (ref 0.0–0.5)
Eosinophils Relative: 4 %
HCT: 36.3 % (ref 36.0–46.0)
Hemoglobin: 12.2 g/dL (ref 12.0–15.0)
Immature Granulocytes: 0 %
Lymphocytes Relative: 33 %
Lymphs Abs: 1.5 10*3/uL (ref 0.7–4.0)
MCH: 31.6 pg (ref 26.0–34.0)
MCHC: 33.6 g/dL (ref 30.0–36.0)
MCV: 94 fL (ref 80.0–100.0)
Monocytes Absolute: 0.5 10*3/uL (ref 0.1–1.0)
Monocytes Relative: 10 %
Neutro Abs: 2.4 10*3/uL (ref 1.7–7.7)
Neutrophils Relative %: 52 %
Platelet Count: 215 10*3/uL (ref 150–400)
RBC: 3.86 MIL/uL — ABNORMAL LOW (ref 3.87–5.11)
RDW: 12.4 % (ref 11.5–15.5)
WBC Count: 4.7 10*3/uL (ref 4.0–10.5)
nRBC: 0 % (ref 0.0–0.2)

## 2022-12-11 MED ORDER — IOHEXOL 300 MG/ML  SOLN
75.0000 mL | Freq: Once | INTRAMUSCULAR | Status: AC | PRN
  Administered 2022-12-11: 75 mL via INTRAVENOUS

## 2022-12-16 ENCOUNTER — Inpatient Hospital Stay: Payer: Medicare HMO | Attending: Internal Medicine | Admitting: Internal Medicine

## 2022-12-16 VITALS — BP 138/69 | HR 72 | Temp 97.8°F | Resp 16 | Ht 66.0 in | Wt 132.3 lb

## 2022-12-16 DIAGNOSIS — Z902 Acquired absence of lung [part of]: Secondary | ICD-10-CM | POA: Insufficient documentation

## 2022-12-16 DIAGNOSIS — R053 Chronic cough: Secondary | ICD-10-CM | POA: Diagnosis not present

## 2022-12-16 DIAGNOSIS — Z85118 Personal history of other malignant neoplasm of bronchus and lung: Secondary | ICD-10-CM | POA: Diagnosis not present

## 2022-12-16 DIAGNOSIS — C3490 Malignant neoplasm of unspecified part of unspecified bronchus or lung: Secondary | ICD-10-CM | POA: Diagnosis not present

## 2022-12-16 NOTE — Progress Notes (Signed)
[facility] [phone]   [phone]  OFFICE PROGRESS NOTE  Kristian Covey, MD [address] [place] [address]  DIAGNOSIS: Stage IA (T1b, N0, M0) non-small cell lung cancer, adenocarcinoma.  She presented with a right lower lobe pulmonary nodule.  She was diagnosed in September 2021.  PRIOR THERAPY: Status post robotic assisted right thoracoscopy with right lower lobe superior segmentectomy and lymph node dissection under the care of Dr. Dorris Fetch on 10/25/2019.  CURRENT THERAPY: Observation.  INTERVAL HISTORY: Teresa Booker 77 y.o. female returns to the clinic today for follow-up visit accompanied by her female partner. Discussed the use of AI scribe software for clinical note transcription with the patient, who gave verbal consent to proceed.  History of Present Illness   The patient, a 77 year old individual with a history of stage 1A non-small cell lung cancer, underwent a right lower lobe superior segmentectomy in September 2021. Since then, she has been under observation with no significant health complaints. She denies experiencing chest pain, breathing issues, or any unusual gastrointestinal symptoms beyond her usual stomach discomfort. However, she has noticed an increase in coughing frequency over the past few months. Despite this, she has not experienced any significant weight loss and has actually gained two pounds.  The patient also reports a recent sinus infection for which she was prescribed doxycycline. However, she experienced an adverse reaction to the medication, including stomach discomfort and palpitations, leading to its discontinuation. She also mentions occasional discomfort at the site of her surgical incision.       MEDICAL HISTORY: Past Medical History:  Diagnosis Date   Alcohol abuse    - abstemious 20 years. Attends AA - strong recovery    Allergic reaction    Allergy    seasonal   Anxiety    COPD  (chronic obstructive pulmonary disease) (HCC)    minor   Family history of adverse reaction to anesthesia    mother had problems with n/v   Fibromyalgia    slight   GERD (gastroesophageal reflux disease)    HSV-1 infection    Hyperlipemia    Hypertension    IBS (irritable bowel syndrome)    NSCL Ca dx'd 10/24/20   Postnasal drip    RBBB (right bundle branch block)    Rotator cuff syndrome    right    ALLERGIES:  is allergic to codeine, penicillins, sulfonamide derivatives, and aspirin.  MEDICATIONS:  Current Outpatient Medications  Medication Sig Dispense Refill   acyclovir (ZOVIRAX) 400 MG tablet Take 1 tablet daily as needed for cold sores 30 tablet 0   aspirin EC 81 MG tablet Take 81 mg by mouth daily. Swallow whole.     Calcium 200 MG TABS Take 200 mg by mouth every evening.      doxycycline (VIBRAMYCIN) 100 MG capsule Take 1 capsule (100 mg total) by mouth 2 (two) times daily. 20 capsule 0   ECHINACEA COMPLEX PO Take 1 tablet by mouth daily.     hydrOXYzine (ATARAX) 25 MG tablet TAKE 1 TABLET BY MOUTH EVERY 8 HOURS AS NEEDED 90 tablet 0   hyoscyamine (LEVBID) 0.375 MG 12 hr tablet Take 1 tablet (0.375 mg total) by mouth 2 (two) times daily. 180 tablet 0   L-Lysine 500 MG TABS Take 500 mg by mouth daily.     loratadine (CLARITIN) 10 MG tablet Take 1 tablet (10 mg total) by mouth daily. 90 tablet 3   Magnesium 250  MG TABS Take 250 mg by mouth every evening.      metoprolol succinate (TOPROL-XL) 50 MG 24 hr tablet TAKE 1 TABLET BY MOUTH ONCE DAILY WITH  OR  IMMEDIATELY  FOLLOWING  MEAL 90 tablet 0   Multiple Vitamin (MULTIVITAMIN WITH MINERALS) TABS tablet Take 1 tablet by mouth daily.     omeprazole (PRILOSEC) 20 MG capsule Take 20 mg by mouth daily before breakfast.     POTASSIUM PO Take 1 tablet by mouth daily.     predniSONE (DELTASONE) 10 MG tablet Take 4 tabs every morning for 3 days, 3 tabs for 2 days, 2 tabs for 2 days, 1 tab for 1 day. (Patient taking differently: as  needed. Take 4 tabs every morning for 3 days, 3 tabs for 2 days, 2 tabs for 2 days, 1 tab for 1 day.) 23 tablet 0   rosuvastatin (CRESTOR) 10 MG tablet TAKE 1 TABLET BY MOUTH AT BEDTIME 90 tablet 0   No current facility-administered medications for this visit.    SURGICAL HISTORY:  Past Surgical History:  Procedure Laterality Date   COLONOSCOPY     INTERCOSTAL NERVE BLOCK Right 10/25/2019   Procedure: INTERCOSTAL NERVE BLOCK;  Surgeon: Loreli Slot, MD;  Location: [facility] OR;  Service: Thoracic;  Laterality: Right;   MOUTH SURGERY  2002   NODE DISSECTION Right 10/25/2019   Procedure: NODE DISSECTION;  Surgeon: Loreli Slot, MD;  Location: [facility] OR;  Service: Thoracic;  Laterality: Right;   ROTATOR CUFF REPAIR  10-24-05   had bone spur and laceration off cuff. Daldorf   XI ROBOTIC ASSISTED THORACOSCOPY- SEGMENTECTOMY Right 10/25/2019   Procedure: XI ROBOTIC ASSISTED THORACOSCOPY-SUPERIOR SEGMENTECTOMY;  Surgeon: Loreli Slot, MD;  Location: [facility] OR;  Service: Thoracic;  Laterality: Right;    REVIEW OF SYSTEMS:  A comprehensive review of systems was negative except for: Constitutional: positive for fatigue Respiratory: positive for cough   PHYSICAL EXAMINATION: General appearance: alert, cooperative, and no distress Head: Normocephalic, without obvious abnormality, atraumatic Neck: no adenopathy, no JVD, supple, symmetrical, trachea midline, and thyroid not enlarged, symmetric, no tenderness/mass/nodules Lymph nodes: Cervical, supraclavicular, and axillary nodes normal. Resp: clear to auscultation bilaterally Back: symmetric, no curvature. ROM normal. No CVA tenderness. Cardio: regular rate and rhythm, S1, S2 normal, no murmur, click, rub or gallop GI: soft, non-tender; bowel sounds normal; no masses,  no organomegaly Extremities: extremities normal, atraumatic, no cyanosis or edema  ECOG PERFORMANCE STATUS: 1 - Symptomatic but completely ambulatory  Blood pressure  138/69, pulse 72, temperature 97.8 F (36.6 C), temperature source Oral, resp. rate 16, height 5\' 6"  (1.676 m), weight 132 lb 4.8 oz (60 kg), SpO2 100%.  LABORATORY DATA: Lab Results  Component Value Date   WBC 4.7 12/11/2022   HGB 12.2 12/11/2022   HCT 36.3 12/11/2022   MCV 94.0 12/11/2022   PLT 215 12/11/2022      Chemistry      Component Value Date/Time   NA 141 12/11/2022 0858   NA 143 08/25/2018 1043   K 4.5 12/11/2022 0858   CL 105 12/11/2022 0858   CO2 30 12/11/2022 0858   BUN 16 12/11/2022 0858   BUN 17 08/25/2018 1043   CREATININE 1.01 (H) 12/11/2022 0858      Component Value Date/Time   CALCIUM 9.7 12/11/2022 0858   ALKPHOS 53 12/11/2022 0858   AST 28 12/11/2022 0858   ALT 22 12/11/2022 0858   BILITOT 0.5 12/11/2022 0858  RADIOGRAPHIC STUDIES: CT Chest W Contrast  Result Date: 12/12/2022 CLINICAL DATA:  Non-small cell lung cancer staging; * Tracking Code: BO * EXAM: CT CHEST WITH CONTRAST TECHNIQUE: Multidetector CT imaging of the chest was performed during intravenous contrast administration. RADIATION DOSE REDUCTION: This exam was performed according to the departmental dose-optimization program which includes automated exposure control, adjustment of the mA and/or kV according to patient size and/or use of iterative reconstruction technique. CONTRAST:  75mL OMNIPAQUE IOHEXOL 300 MG/ML  SOLN COMPARISON:  Multiple priors, most recent chest CT dated December 07, 2021 FINDINGS: Cardiovascular: Normal heart size. No pericardial effusion. Normal caliber thoracic aorta with moderate calcified plaque. Mild coronary artery calcifications. Mediastinum/Nodes: Esophagus and thyroid are unremarkable. No enlarged lymph nodes seen in the chest. Lungs/Pleura: Stable postsurgical findings of prior superior segmentectomy of the right lower lobe. Stable small solid pulmonary nodule of the right middle lobe measuring 3 mm on series 6, image 81. Stable small ground-glass nodules of  the right lower lobe measuring 5 mm on series 6, image 132 and 6 mm on image 135. No new or enlarging pulmonary nodules. Mild emphysema. No pleural effusion. Upper Abdomen: No acute abnormality. Musculoskeletal: No chest wall abnormality. No acute or significant osseous findings. IMPRESSION: 1. Stable postsurgical findings of prior superior segmentectomy of the right lower lobe. No evidence of recurrent or metastatic disease in the chest. 2. Stable small solid and ground-glass pulmonary nodules. Recommend attention on follow-up. 3. Coronary artery calcifications, aortic Atherosclerosis (ICD10-I70.0) and Emphysema (ICD10-J43.9). Electronically Signed   By: Allegra Lai M.D.   On: 12/12/2022 17:18     ASSESSMENT AND PLAN: This is a very pleasant 77 years old white female recently diagnosed with a stage IA (T1b, N0, M0) non-small cell lung cancer, adenocarcinoma diagnosed in September 2021 status post right lower lobe superior segmentectomy with lymph node dissection under the care of Dr. Dorris Fetch. The patient is currently on observation and she is feeling fine with no concerning complaints except for mild cough recently secondary to postnasal drainage and bronchitis.  She had repeat CT scan of the chest performed recently.  I personally and independently reviewed the scan and discussed the result with the patient and her partner.  Stage 1A Non-Small Cell Lung Cancer Status post right lower lobe superior segmentectomy in September 2021. No new complaints or symptoms. Recent imaging shows no concerning findings. -Continue observation and follow-up with another scan in 1 year.  Chronic Cough Increased coughing noted over the past few months. No associated chest pain or breathing issues. -Continue monitoring symptoms.  Gastrointestinal Issues Reports of a "bad stomach" with nausea and vomiting depending on diet. -Continue monitoring symptoms and dietary adjustments as needed.  Medication  Allergy Reported intolerance to Doxycycline, with stomach upset and palpitations. -Add Doxycycline to allergy list.   The patient was advised to call immediately if she has any other concerning symptoms in the interval. The patient voices understanding of current disease status and treatment options and is in agreement with the current care plan.  All questions were answered. The patient knows to call the clinic with any problems, questions or concerns. We can certainly see the patient much sooner if necessary.   Disclaimer: This note was dictated with voice recognition software. Similar sounding words can inadvertently be transcribed and may not be corrected upon review.

## 2022-12-19 ENCOUNTER — Encounter: Payer: Self-pay | Admitting: Family Medicine

## 2022-12-20 ENCOUNTER — Other Ambulatory Visit: Payer: Self-pay | Admitting: Family Medicine

## 2022-12-21 ENCOUNTER — Telehealth: Payer: Self-pay | Admitting: Internal Medicine

## 2022-12-21 NOTE — Telephone Encounter (Signed)
Scheduled per 11/04 los, patient has been called and notified of upcoming appointments.

## 2022-12-27 NOTE — Progress Notes (Signed)
Cardiology Office Note    Date:  01/08/2023   ID:  Teresa Booker, [place] 27-Aug-1945, MRN [id number]  PCP:  Kristian Covey, MD  Cardiologist: Charlton Haws, MD    History of Present Illness:   77 y.o. significant partner to our nurse Danielle Rankin. History of HLD, palpitations with benign PVCls Rx beta blocker. Normal EF and mild MVP no significant MR Prednisone and caffeine make her palpitations worse   Calcium score 04/05/19 109 which is 71 st percentile for age and sex isolated to LAD On crestor with LDL at goal 69 02/14/21    Nodule in superior segment of RLL She eventually had robotic RLL segmentectomy by Dr Dorris Fetch 10/25/19  for T1N0 sage 1A adenocarcinoma  No cardiac symptoms Some paresthesias over surgical incision from thoracotomy  Recent sinus infection and had nausea/vomiting with Doxycycline now listed as allergy   Concerned about ground glass nodules on CT but they are chronic and no evidence of metastatic dx   Past Medical History:  Diagnosis Date   Alcohol abuse    - abstemious 20 years. Attends AA - strong recovery    Allergic reaction    Allergy    seasonal   Anxiety    COPD (chronic obstructive pulmonary disease) (HCC)    minor   Family history of adverse reaction to anesthesia    mother had problems with n/v   Fibromyalgia    slight   GERD (gastroesophageal reflux disease)    HSV-1 infection    Hyperlipemia    Hypertension    IBS (irritable bowel syndrome)    NSCL Ca dx'd 10/24/20   Postnasal drip    RBBB (right bundle branch block)    Rotator cuff syndrome    right    Past Surgical History:  Procedure Laterality Date   COLONOSCOPY     INTERCOSTAL NERVE BLOCK Right 10/25/2019   Procedure: INTERCOSTAL NERVE BLOCK;  Surgeon: Loreli Slot, MD;  Location: [facility] OR;  Service: Thoracic;  Laterality: Right;   MOUTH SURGERY  2002   NODE DISSECTION Right 10/25/2019   Procedure: NODE DISSECTION;  Surgeon: Loreli Slot, MD;  Location: [facility]  OR;  Service: Thoracic;  Laterality: Right;   ROTATOR CUFF REPAIR  10-24-05   had bone spur and laceration off cuff. Daldorf   XI ROBOTIC ASSISTED THORACOSCOPY- SEGMENTECTOMY Right 10/25/2019   Procedure: XI ROBOTIC ASSISTED THORACOSCOPY-SUPERIOR SEGMENTECTOMY;  Surgeon: Loreli Slot, MD;  Location: [facility] OR;  Service: Thoracic;  Laterality: Right;    Current Medications: Current Meds  Medication Sig   aspirin EC 81 MG tablet Take 81 mg by mouth daily. Swallow whole.   Calcium 200 MG TABS Take 200 mg by mouth every evening.    diphenhydramine-acetaminophen (TYLENOL PM EXTRA STRENGTH) 25-500 MG TABS tablet Take 1 tablet by mouth at bedtime as needed.   ECHINACEA COMPLEX PO Take 1 tablet by mouth daily.   hydrOXYzine (ATARAX) 25 MG tablet TAKE 1 TABLET BY MOUTH EVERY 8 HOURS AS NEEDED   hyoscyamine (LEVBID) 0.375 MG 12 hr tablet TAKE 1 TABLET BY MOUTH 2 TIMES DAILY.   L-Lysine 500 MG TABS Take 500 mg by mouth daily.   loratadine (CLARITIN) 10 MG tablet Take 1 tablet (10 mg total) by mouth daily.   Magnesium 250 MG TABS Take 250 mg by mouth every evening.    metoprolol succinate (TOPROL-XL) 50 MG 24 hr tablet TAKE 1 TABLET BY MOUTH ONCE DAILY WITH  OR  IMMEDIATELY  FOLLOWING  MEAL   Multiple Vitamin (MULTIVITAMIN WITH MINERALS) TABS tablet Take 1 tablet by mouth daily.   omeprazole (PRILOSEC) 20 MG capsule Take 20 mg by mouth daily before breakfast.   POTASSIUM PO Take 1 tablet by mouth daily.   rosuvastatin (CRESTOR) 10 MG tablet TAKE 1 TABLET BY MOUTH AT BEDTIME     Allergies:   Codeine, Penicillins, Sulfonamide derivatives, Doxycycline, and Aspirin   Social History   Socioeconomic History   Marital status: Single    Spouse name: Not on file   Number of children: 0   Years of education: 16   Highest education level: Bachelor's degree (e.g., BA, AB, BS)  Occupational History   Occupation: Production designer, theatre/television/film    Comment: retired  Tobacco Use   Smoking status: Former    Current  packs/day: 0.00    Average packs/day: 1 pack/day for 25.0 years (25.0 ttl pk-yrs)    Types: Cigarettes    Start date: 03/13/1971    Quit date: 03/12/1996    Years since quitting: 26.8    Passive exposure: Past   Smokeless tobacco: Never  Vaping Use   Vaping status: Never Used  Substance and Sexual Activity   Alcohol use: No    Comment: former user AA 20+yrs ago strong recovery   Drug use: No   Sexual activity: Not Currently    Partners: Female    Birth control/protection: Post-menopausal    Comment: older than 16, less than 5  Other Topics Concern   Not on file  Social History Narrative   03/30/19:   HH 2   [employer]   Long-term relationship - '95   1 cat   Social Determinants of Health   Financial Resource Strain: Low Risk  (10/22/2021)   Overall Financial Resource Strain (CARDIA)    Difficulty of Paying Living Expenses: Not hard at all  Food Insecurity: No Food Insecurity (10/22/2021)   Hunger Vital Sign    Worried About Running Out of Food in the Last Year: Never true    Ran Out of Food in the Last Year: Never true  Transportation Needs: No Transportation Needs (10/22/2021)   PRAPARE - Administrator, Civil Service (Medical): No    Lack of Transportation (Non-Medical): No  Physical Activity: Insufficiently Active (10/22/2021)   Exercise Vital Sign    Days of Exercise per Week: 4 days    Minutes of Exercise per Session: 30 min  Stress: Stress Concern Present (10/22/2021)   [employer] of Occupational Health - Occupational Stress Questionnaire    Feeling of Stress : To some extent  Social Connections: Unknown (10/22/2021)   Social Connection and Isolation Panel [NHANES]    Frequency of Communication with Friends and Family: Twice a week    Frequency of Social Gatherings with Friends and Family: Patient declined    Attends Religious Services: Never    Database administrator or Organizations: Yes    Attends Engineer, structural: More than  4 times per year    Marital Status: Living with partner     Family History:  The patient's   family history includes Aneurysm in her mother and another family member; Brain cancer in her brother; Cancer in her brother; Cancer (age of onset: 49) in her brother; Coronary artery disease in her mother; Diabetes in her brother; Heart attack in her mother; Hyperlipidemia in her mother; Irritable bowel syndrome in her mother; Kidney cancer in her brother; Lung cancer in her brother and another family  member; Stroke in her father and another family member.   ROS:   Please see the history of present illness.    ROS All other systems reviewed and are negative.   PHYSICAL EXAM:   VS:  BP 136/68 (BP Location: Right Arm, Patient Position: Sitting, Cuff Size: Normal)   Pulse 72   Resp 16   Ht 5\' 6"  (1.676 m)   Wt 133 lb (60.3 kg)   SpO2 99%   BMI 21.47 kg/m    Affect appropriate Healthy:  appears stated age HEENT: normal Neck supple with no adenopathy JVP normal no bruits no thyromegaly Lungs clear post right thoracotomy robotic  Heart:  S1/S2 no murmur, no rub, gallop or click PMI normal Abdomen: benighn, BS positve, no tenderness, no AAA no bruit.  No HSM or HJR Distal pulses intact with no bruits No edema Neuro non-focal Skin warm and dry No muscular weakness  Wt Readings from Last 3 Encounters:  01/08/23 133 lb (60.3 kg)  12/16/22 132 lb 4.8 oz (60 kg)  11/20/22 130 lb (59 kg)      Studies/Labs Reviewed:   EKG:   08/25/18 NSR rate 76 nonspecific ST changes with frequent PVC's 01/08/2023 SR rate 66 normal   Recent Labs: 12/11/2022: ALT 22; BUN 16; Creatinine 1.01; Hemoglobin 12.2; Platelet Count 215; Potassium 4.5; Sodium 141   Lipid Panel    Component Value Date/Time   CHOL 135 09/03/2021 0922   TRIG 85 09/03/2021 0922   HDL 55 09/03/2021 0922   CHOLHDL 2.5 09/03/2021 0922   CHOLHDL 2.7 10/05/2015 0758   VLDL 28 10/05/2015 0758   LDLCALC 64 09/03/2021 0922     Additional studies/ records that were reviewed today include:  NST 05/06/2017  Nuclear stress EF: 70%. There was no ST segment deviation noted during stress. Blood pressure demonstrated a hypertensive response to exercise. The study is normal. This is a low risk study. The left ventricular ejection fraction is hyperdynamic (>65%).   Normal exercise nuclear stress test with no evidence for prior infarct or ischemia.  Normal LVEF. Poor exercise capacity. Hypertensive response to exercise.   2D echo 3/26/2019Study Conclusions   - Left ventricle: The cavity size was normal. Systolic function was   normal. The estimated ejection fraction was in the range of 60%   to 65%. Wall motion was normal; there were no regional wall   motion abnormalities. Left ventricular diastolic function   parameters were normal. - Mitral valve: Mild, late systolicprolapse, involving the anterior   leaflet. There was no significant regurgitation. - Pulmonary arteries: PA peak pressure: 32 mm Hg (S).   Holter monitor 04/2017 Sinus rhythm PAC;s/ PVC;s No significant arrhythmias       ASSESSMENT:    Palpitations, HLD, Pulmonary Nodule   PLAN:  In order of problems listed above:   Palpitations Benign in setting of no obstructive CAD normal EF RX beta blockers with improvement Limit Caffeine   Hyperlipidemia on Crestor LDL at goal  Pulmonary: F/U Dr Tonia Brooms and Dorris Fetch Post segmental lobectomy on right for adenocarcinoma stage 1AN0 Chest CT 06/04/21 unchanged post op no new lesions Discussed stability of these ground glass nodules on multiple CT scans and no evidence of new metastatic dx   F/U with me in a year   Signed, Charlton Haws, MD  01/08/2023 9:10 AM    [facility] [address] [place], [place], [place]  [address] Phone: [phone]; Fax: [phone]

## 2022-12-28 ENCOUNTER — Other Ambulatory Visit: Payer: Self-pay | Admitting: Cardiovascular Disease

## 2023-01-04 ENCOUNTER — Other Ambulatory Visit: Payer: Self-pay | Admitting: Family Medicine

## 2023-01-08 ENCOUNTER — Ambulatory Visit: Payer: Medicare HMO | Attending: Cardiovascular Disease | Admitting: Cardiovascular Disease

## 2023-01-08 ENCOUNTER — Encounter: Payer: Self-pay | Admitting: Cardiovascular Disease

## 2023-01-08 VITALS — BP 136/68 | HR 72 | Resp 16 | Ht 66.0 in | Wt 133.0 lb

## 2023-01-08 DIAGNOSIS — C3491 Malignant neoplasm of unspecified part of right bronchus or lung: Secondary | ICD-10-CM

## 2023-01-08 DIAGNOSIS — E785 Hyperlipidemia, unspecified: Secondary | ICD-10-CM

## 2023-01-08 DIAGNOSIS — R002 Palpitations: Secondary | ICD-10-CM

## 2023-01-08 NOTE — Patient Instructions (Signed)
Medication Instructions:  Your physician recommends that you continue on your current medications as directed. Please refer to the Current Medication list given to you today.  *If you need a refill on your cardiac medications before your next appointment, please call your pharmacy*   Follow-Up: At [facility], you and your health needs are our priority.  As part of our continuing mission to provide you with exceptional heart care, we have created designated Provider Care Teams.  These Care Teams include your primary Cardiologist (physician) and Advanced Practice Providers (APPs -  Physician Assistants and Nurse Practitioners) who all work together to provide you with the care you need, when you need it.  We recommend signing up for the patient portal called "MyChart".  Sign up information is provided on this After Visit Summary.  MyChart is used to connect with patients for Virtual Visits (Telemedicine).  Patients are able to view lab/test results, encounter notes, upcoming appointments, etc.  Non-urgent messages can be sent to your provider as well.   To learn more about what you can do with MyChart, go to [email or web address].    Your next appointment:   6 month(s)  Provider:   Charlton Haws, MD

## 2023-01-16 ENCOUNTER — Other Ambulatory Visit: Payer: Self-pay | Admitting: Cardiovascular Disease

## 2023-01-18 ENCOUNTER — Other Ambulatory Visit: Payer: Self-pay | Admitting: Family Medicine

## 2023-01-27 ENCOUNTER — Other Ambulatory Visit: Payer: Self-pay | Admitting: Cardiovascular Disease

## 2023-03-01 IMAGING — CT CT CHEST W/ CM
2 of 4 series · 15 of 36 positions shown, 18 images · IV contrast (OMNIPAQUE)
Comparison: Most recent CT chest 06/05/2020.  10/08/2019 PET-CT.

CLINICAL DATA: Primary Cancer Type: Lung
TECHNIQUE: Multidetector CT imaging of the chest was performed during
intravenous contrast administration.

CONTRAST:  60mL OMNIPAQUE IOHEXOL 350 MG/ML SOLN

[Series 2: axial st · axial · 0.67mm/px · z∈[-356,-48]mm · 12 of 183 slices shown, 15 images]
[im 15/183  mediastinal]
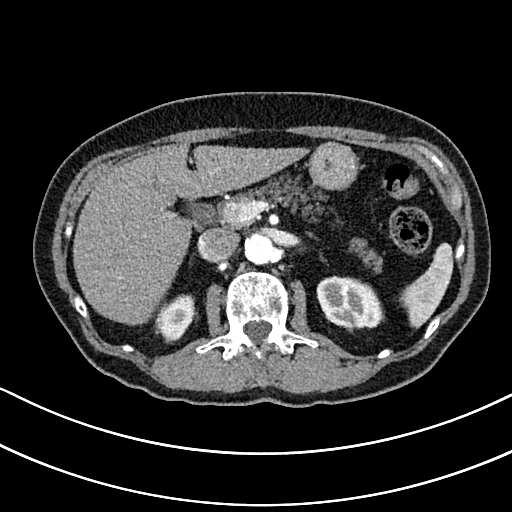
[im 15/183  lung]
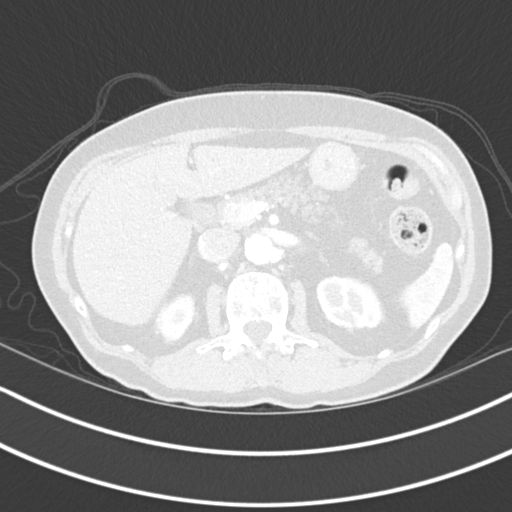
[im 29/183  lung]
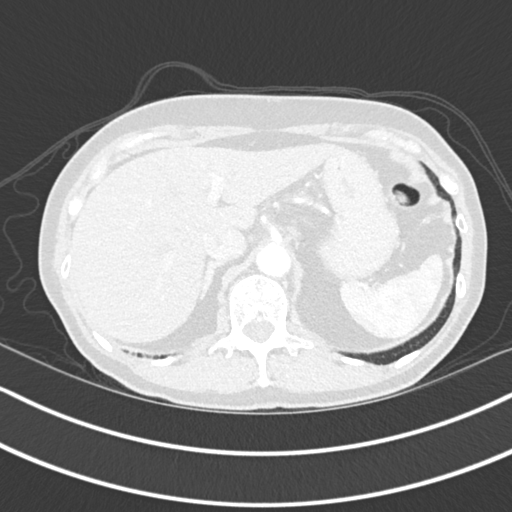
[im 43/183  lung]
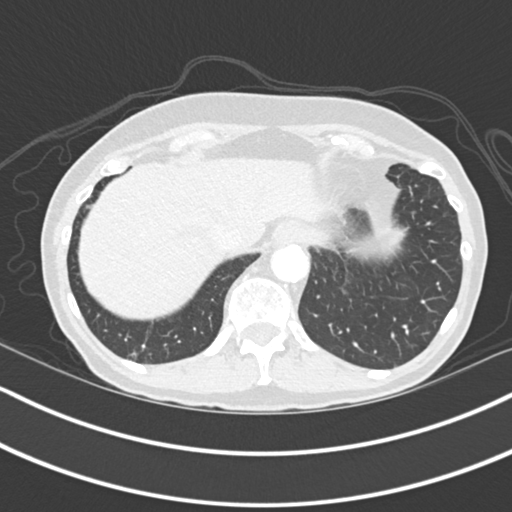
[im 57/183  lung]
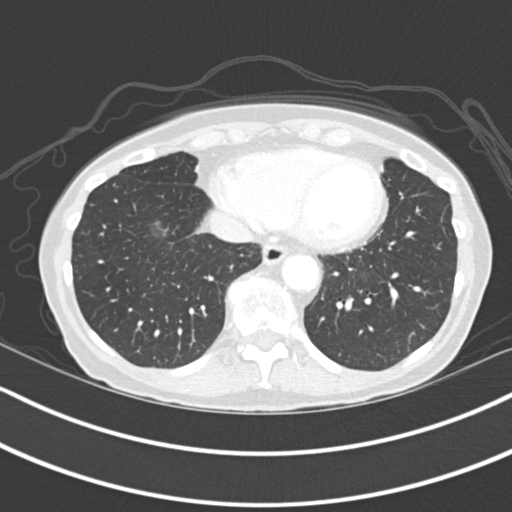
[im 71/183  mediastinal]
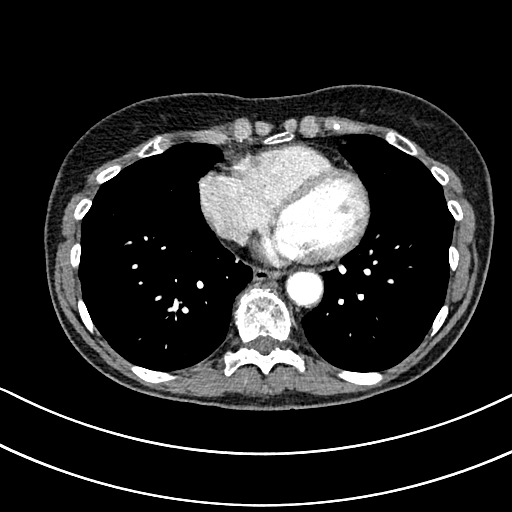
[im 71/183  lung]
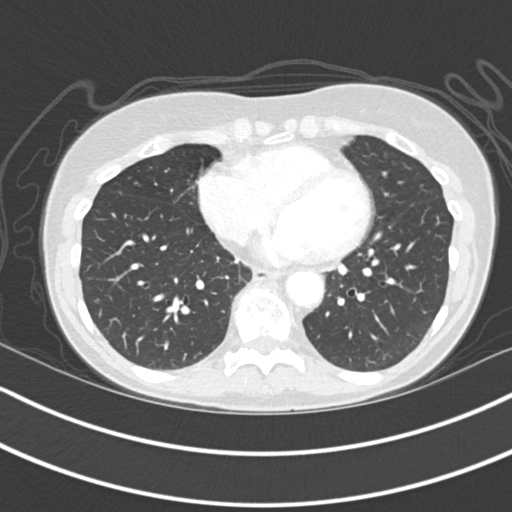
[im 85/183  lung]
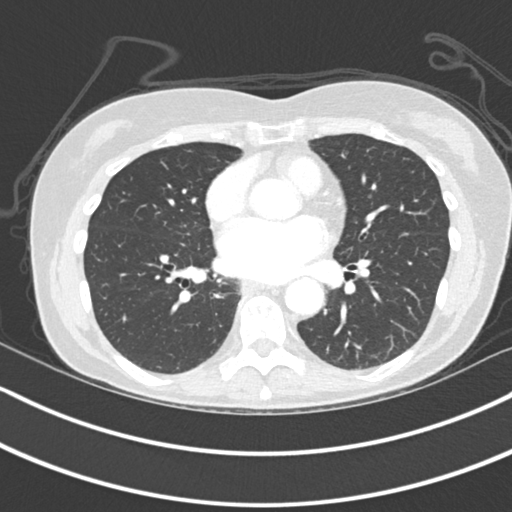
[im 99/183  lung]
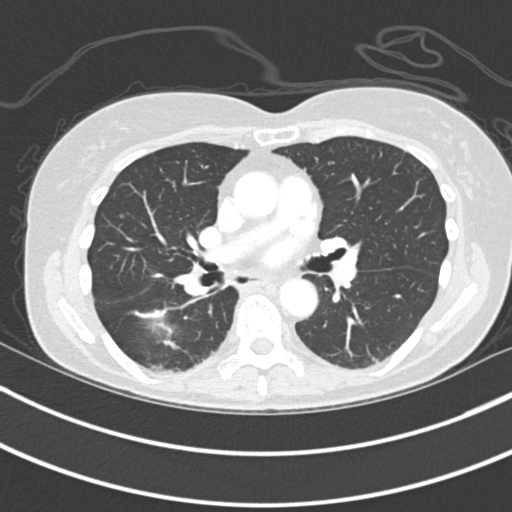
[im 113/183  lung]
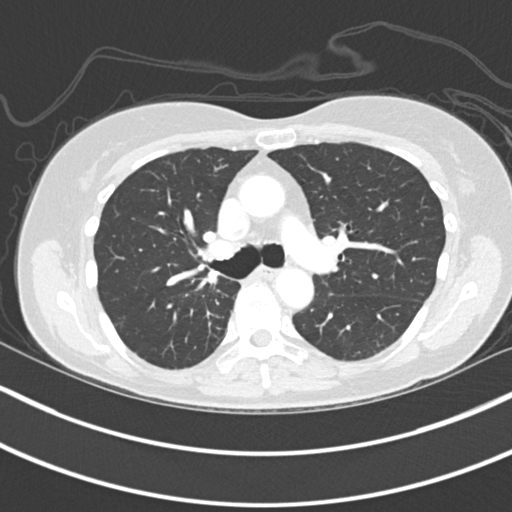
[im 127/183  mediastinal]
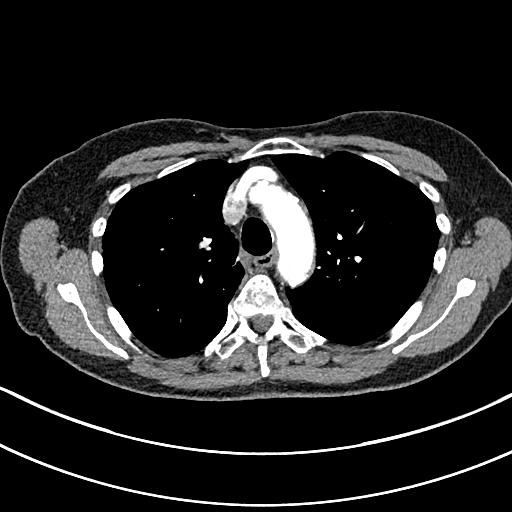
[im 127/183  lung]
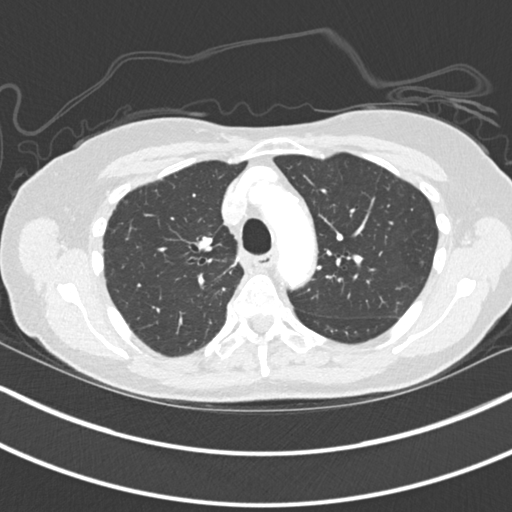
[im 141/183  lung]
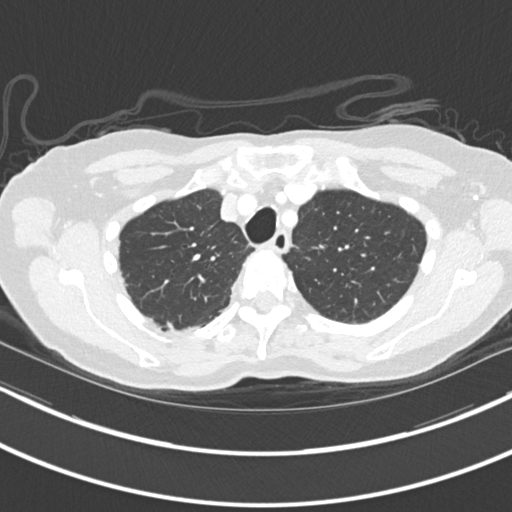
[im 155/183  lung]
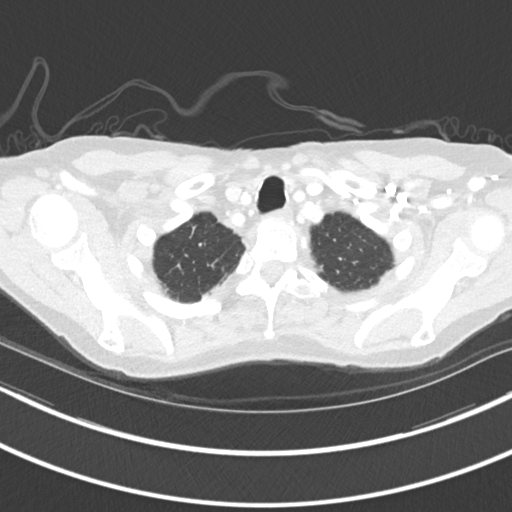
[im 169/183  lung]
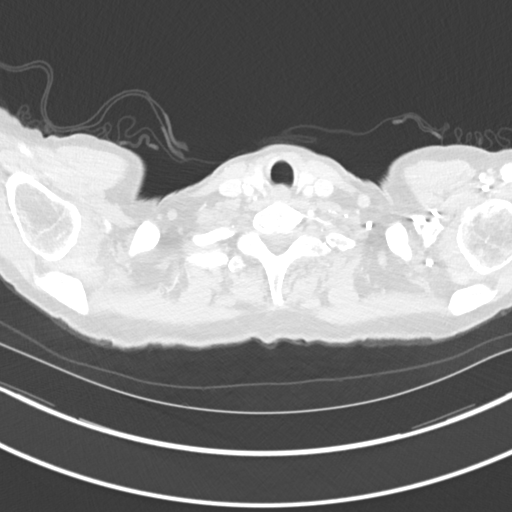

[Series 5: coronal · coronal · 0.72mm/px · 3 of 116 slices shown]
[im 24/116  lung]
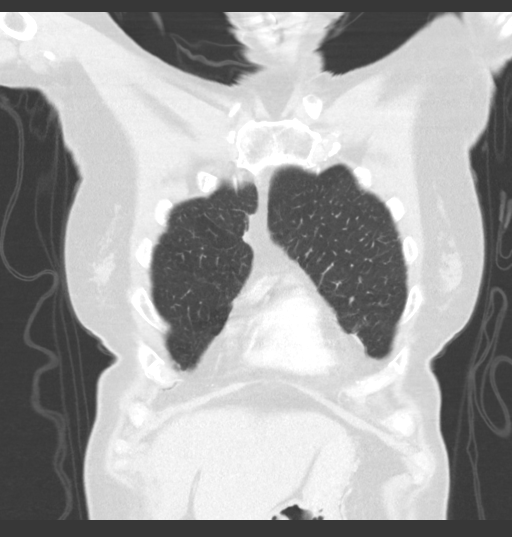
[im 47/116  lung]
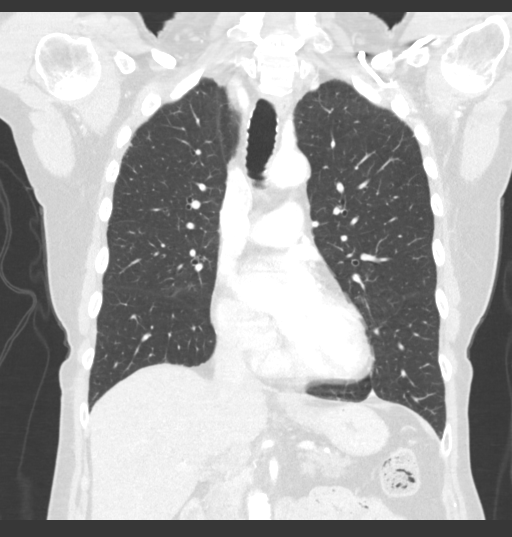
[im 70/116  lung]
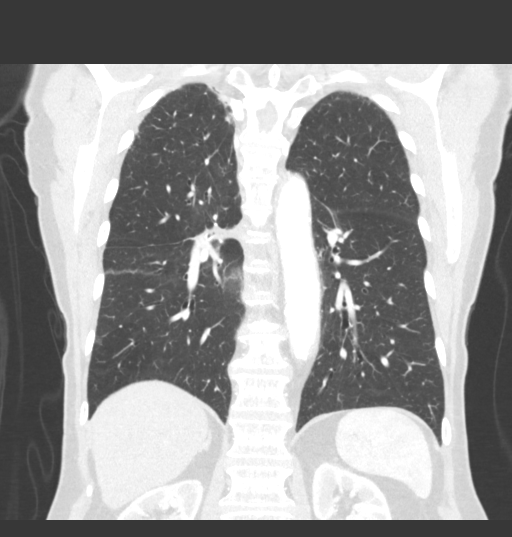

[15 of 36 positions shown; findings below may reference images not displayed]

Imaging Indication: Routine surveillance

Interval therapy since last imaging? No

Initial Cancer Diagnosis

Date: 10/24/2020; Established by: Biopsy-proven

Detailed Pathology: Stage IA non-small cell lung cancer,
adenocarcinoma.

Primary Tumor location:  Right lower lobe.

Surgeries: Right lower lobe superior segmentectomy 10/24/2020.

Chemotherapy: No

Immunotherapy? No

Radiation therapy? No

EXAM:
CT CHEST WITH CONTRAST
FINDINGS: Cardiovascular: The heart size is normal. No substantial pericardial
effusion. Coronary artery calcification is evident. Mild
atherosclerotic calcification is noted in the wall of the thoracic
aorta.

Mediastinum/Nodes: No mediastinal lymphadenopathy. There is no hilar
lymphadenopathy. The esophagus has normal imaging features. There is
no axillary lymphadenopathy.

Lungs/Pleura: Post treatment scarring noted right lower lobe,
stable. Several scattered tiny peripheral pulmonary nodules are
unchanged. No new suspicious pulmonary nodule or mass. No focal
airspace consolidation. No pleural effusion.

Upper Abdomen: Unremarkable.

Musculoskeletal: No worrisome lytic or sclerotic osseous
abnormality.
IMPRESSION: 1. Stable exam. No new or progressive findings.
2. Post treatment scarring in the right lower lobe.
3. Aortic Atherosclerosis ([id number]).

## 2023-03-19 ENCOUNTER — Encounter: Payer: Self-pay | Admitting: Family Medicine

## 2023-03-19 MED ORDER — ACYCLOVIR 400 MG PO TABS: ORAL_TABLET | ORAL | 0 refills | Status: DC

## 2023-04-07 ENCOUNTER — Other Ambulatory Visit: Payer: Self-pay | Admitting: Family Medicine

## 2023-04-16 ENCOUNTER — Other Ambulatory Visit: Payer: Self-pay | Admitting: Family Medicine

## 2023-04-16 ENCOUNTER — Encounter: Payer: Self-pay | Admitting: Family Medicine

## 2023-04-16 MED ORDER — HYDROXYZINE HCL 25 MG PO TABS: 25.0000 mg | ORAL_TABLET | Freq: Three times a day (TID) | ORAL | 0 refills | Status: DC | PRN

## 2023-04-16 NOTE — Addendum Note (Signed)
 Addended by: Christy Sartorius on: 04/16/2023 04:14 PM   Modules accepted: Orders

## 2023-04-18 ENCOUNTER — Other Ambulatory Visit (HOSPITAL_COMMUNITY): Payer: Self-pay

## 2023-04-18 ENCOUNTER — Telehealth: Payer: Self-pay | Admitting: Pharmacy Technician

## 2023-04-18 NOTE — Telephone Encounter (Signed)
 Good morning we received a PA in Cover My Meds for Teresa Booker on her hydroxyzine 25 mg tablets. Upon calling [facility] they stated they filled this for her and it was $5 so I will Archive the PA request in Cover My Meds.

## 2023-04-18 NOTE — Telephone Encounter (Signed)
 Noted.

## 2023-04-22 ENCOUNTER — Other Ambulatory Visit (HOSPITAL_COMMUNITY): Payer: Self-pay

## 2023-05-28 ENCOUNTER — Other Ambulatory Visit: Payer: Self-pay | Admitting: Family Medicine

## 2023-06-18 ENCOUNTER — Other Ambulatory Visit: Payer: Self-pay | Admitting: Family Medicine

## 2023-06-19 ENCOUNTER — Other Ambulatory Visit (HOSPITAL_COMMUNITY): Payer: Self-pay

## 2023-06-19 MED ORDER — HYDROXYZINE HCL 25 MG PO TABS: 25.0000 mg | ORAL_TABLET | Freq: Three times a day (TID) | ORAL | 0 refills | Status: DC | PRN

## 2023-06-19 NOTE — Telephone Encounter (Signed)
Rx done for 90 day supply. 

## 2023-06-20 DIAGNOSIS — Z880 Allergy status to penicillin: Secondary | ICD-10-CM | POA: Diagnosis not present

## 2023-06-20 DIAGNOSIS — I2510 Atherosclerotic heart disease of native coronary artery without angina pectoris: Secondary | ICD-10-CM | POA: Diagnosis not present

## 2023-06-20 DIAGNOSIS — E785 Hyperlipidemia, unspecified: Secondary | ICD-10-CM | POA: Diagnosis not present

## 2023-06-20 DIAGNOSIS — I10 Essential (primary) hypertension: Secondary | ICD-10-CM | POA: Diagnosis not present

## 2023-06-20 DIAGNOSIS — Z87891 Personal history of nicotine dependence: Secondary | ICD-10-CM | POA: Diagnosis not present

## 2023-06-20 DIAGNOSIS — K219 Gastro-esophageal reflux disease without esophagitis: Secondary | ICD-10-CM | POA: Diagnosis not present

## 2023-06-20 DIAGNOSIS — J302 Other seasonal allergic rhinitis: Secondary | ICD-10-CM | POA: Diagnosis not present

## 2023-06-20 DIAGNOSIS — I739 Peripheral vascular disease, unspecified: Secondary | ICD-10-CM | POA: Diagnosis not present

## 2023-06-20 DIAGNOSIS — E876 Hypokalemia: Secondary | ICD-10-CM | POA: Diagnosis not present

## 2023-06-20 DIAGNOSIS — Z008 Encounter for other general examination: Secondary | ICD-10-CM | POA: Diagnosis not present

## 2023-06-20 DIAGNOSIS — Z8249 Family history of ischemic heart disease and other diseases of the circulatory system: Secondary | ICD-10-CM | POA: Diagnosis not present

## 2023-06-20 DIAGNOSIS — Z7982 Long term (current) use of aspirin: Secondary | ICD-10-CM | POA: Diagnosis not present

## 2023-06-20 DIAGNOSIS — I700 Atherosclerosis of aorta: Secondary | ICD-10-CM | POA: Diagnosis not present

## 2023-07-04 ENCOUNTER — Other Ambulatory Visit: Payer: Self-pay | Admitting: Family Medicine

## 2023-07-16 ENCOUNTER — Encounter: Payer: Self-pay | Admitting: Family Medicine

## 2023-07-28 ENCOUNTER — Other Ambulatory Visit: Payer: Self-pay | Admitting: Family Medicine

## 2023-08-05 ENCOUNTER — Other Ambulatory Visit: Payer: Self-pay

## 2023-08-05 ENCOUNTER — Telehealth: Payer: Self-pay | Admitting: Family Medicine

## 2023-08-05 MED ORDER — HYOSCYAMINE SULFATE ER 0.375 MG PO TB12: 0.3750 mg | ORAL_TABLET | Freq: Two times a day (BID) | ORAL | 0 refills | Status: DC

## 2023-08-05 NOTE — Telephone Encounter (Signed)
 Patient scheduled for CPE and rx refilled.

## 2023-08-05 NOTE — Telephone Encounter (Signed)
 Pt need hyoscyamine  (LEVBID ) 0.375 MG 12 hr tablet to be sent to  [id number] - [place], [place] - [address] AT [facility] [address] [facility] [address] Phone: [phone]  Fax: [phone]     Not [place] prior

## 2023-08-05 NOTE — Telephone Encounter (Signed)
 Rx sent.

## 2023-08-11 ENCOUNTER — Ambulatory Visit (INDEPENDENT_AMBULATORY_CARE_PROVIDER_SITE_OTHER): Admitting: Family Medicine

## 2023-08-11 ENCOUNTER — Encounter: Payer: Self-pay | Admitting: Family Medicine

## 2023-08-11 VITALS — BP 134/60 | HR 85 | Temp 98.0°F | Ht 65.75 in | Wt 132.9 lb

## 2023-08-11 DIAGNOSIS — E785 Hyperlipidemia, unspecified: Secondary | ICD-10-CM

## 2023-08-11 DIAGNOSIS — Z0000 Encounter for general adult medical examination without abnormal findings: Secondary | ICD-10-CM | POA: Diagnosis not present

## 2023-08-11 MED ORDER — METHOCARBAMOL 500 MG PO TABS: 500.0000 mg | ORAL_TABLET | Freq: Four times a day (QID) | ORAL | 0 refills | Status: AC | PRN

## 2023-08-11 MED ORDER — VALACYCLOVIR HCL 1 G PO TABS: ORAL_TABLET | ORAL | 0 refills | Status: DC

## 2023-08-11 MED ORDER — HYDROXYZINE HCL 25 MG PO TABS: 25.0000 mg | ORAL_TABLET | Freq: Three times a day (TID) | ORAL | 2 refills | Status: DC | PRN

## 2023-08-11 MED ORDER — HYOSCYAMINE SULFATE ER 0.375 MG PO TB12: 0.3750 mg | ORAL_TABLET | Freq: Two times a day (BID) | ORAL | 0 refills | Status: DC

## 2023-08-11 NOTE — Progress Notes (Signed)
 Established Patient Office Visit  Subjective   Patient ID: Teresa Booker, female    DOB: [birth year]  Age: 78 y.o. MRN: [id number]  Chief Complaint  Patient presents with   Annual Exam    HPI   [name] is seen today for complete physical.  She has history of adenocarcinoma of the lung, IBS, hyperlipidemia.  She has gotten regular follow-up CT scans since her surgery for non-small cell lung cancer.  Denies any chronic cough.  Occasional dyspnea with exertion but not consistently.  No recent chest pains.  Generally doing well.  She swims regularly for exercise.  Remains on rosuvastatin  low-dose for hyperlipidemia.  No recent lipid panel.  She has had history of palpitations and is followed by cardiology.  Maintained on Toprol -XL.  Generally doing well.  She did have some recent right sciatica type symptoms which have now resolved.  She saw a chiropractor and had some treatments.  Health maintenance reviewed:  -Repeat colonoscopy due 9/27 - Tetanus up-to-date - Previous DEXA scan 2020 with excellent results.  These were normal with no osteopenia - Mammogram due 8/25.  She plans to schedule - Prior adverse reaction with pneumonia vaccination - She reportedly had Zostavax but not Shingrix.  Family History  Problem Relation Age of Onset   Hyperlipidemia Mother    Irritable bowel syndrome Mother    Coronary artery disease Mother    Heart attack Mother    Aneurysm Mother        brain   Stroke Father    Diabetes Brother    Cancer Brother        small cell lung ca with mets/ smoker   Brain cancer Brother    Lung cancer Brother    Cancer Brother 36       liver cancer ?primary   Kidney cancer Brother    Stroke Other    Aneurysm Other    Lung cancer Other    Colon cancer Neg Hx    Esophageal cancer Neg Hx    Pancreatic cancer Neg Hx    Stomach cancer Neg Hx    Social History   Socioeconomic History   Marital status: Single    Spouse name: Not on file   Number of children: 0    Years of education: 16   Highest education level: Bachelor's degree (e.g., BA, AB, BS)  Occupational History   Occupation: Production designer, theatre/television/film    Comment: retired  Tobacco Use   Smoking status: Former    Current packs/day: 0.00    Average packs/day: 1 pack/day for 25.0 years (25.0 ttl pk-yrs)    Types: Cigarettes    Start date: 03/13/1971    Quit date: 03/12/1996    Years since quitting: 27.4    Passive exposure: Past   Smokeless tobacco: Never  Vaping Use   Vaping status: Never Used  Substance and Sexual Activity   Alcohol use: No    Comment: former user AA 20+yrs ago strong recovery   Drug use: No   Sexual activity: Not Currently    Partners: Female    Birth control/protection: Post-menopausal    Comment: older than 16, less than 5  Other Topics Concern   Not on file  Social History Narrative   03/30/19:   HH 2   [employer]   Long-term relationship - '95   1 cat   Social Drivers of Corporate investment banker Strain: Low Risk  (10/22/2021)   Overall Financial Resource Strain (CARDIA)  Difficulty of Paying Living Expenses: Not hard at all  Food Insecurity: No Food Insecurity (08/06/2023)   Hunger Vital Sign    Worried About Running Out of Food in the Last Year: Never true    Ran Out of Food in the Last Year: Never true  Transportation Needs: No Transportation Needs (08/06/2023)   PRAPARE - Administrator, Civil Service (Medical): No    Lack of Transportation (Non-Medical): No  Physical Activity: Insufficiently Active (08/06/2023)   Exercise Vital Sign    Days of Exercise per Week: 4 days    Minutes of Exercise per Session: 20 min  Stress: No Stress Concern Present (08/06/2023)   [employer] of Occupational Health - Occupational Stress Questionnaire    Feeling of Stress: Only a little  Social Connections: Moderately Isolated (08/06/2023)   Social Connection and Isolation Panel    Frequency of Communication with Friends and Family: Twice a week     Frequency of Social Gatherings with Friends and Family: Once a week    Attends Religious Services: Never    Database administrator or Organizations: No    Attends Engineer, structural: Not on file    Marital Status: Living with partner  Intimate Partner Violence: Not At Risk (05/01/2020)   Humiliation, Afraid, Rape, and Kick questionnaire    Fear of Current or Ex-Partner: No    Emotionally Abused: No    Physically Abused: No    Sexually Abused: No   Past Medical History:  Diagnosis Date   Alcohol abuse    - abstemious 20 years. Attends AA - strong recovery    Allergic reaction    Allergy    seasonal   Anxiety    COPD (chronic obstructive pulmonary disease) (HCC)    minor   Family history of adverse reaction to anesthesia    mother had problems with n/v   Fibromyalgia    slight   GERD (gastroesophageal reflux disease)    HSV-1 infection    Hyperlipemia    Hypertension    IBS (irritable bowel syndrome)    NSCL Ca dx'd 10/24/20   Postnasal drip    RBBB (right bundle branch block)    Rotator cuff syndrome    right   Past Surgical History:  Procedure Laterality Date   COLONOSCOPY     INTERCOSTAL NERVE BLOCK Right 10/25/2019   Procedure: INTERCOSTAL NERVE BLOCK;  Surgeon: [name], MD;  Location: [facility];  Service: Thoracic;  Laterality: Right;   MOUTH SURGERY  2002   NODE DISSECTION Right 10/25/2019   Procedure: NODE DISSECTION;  Surgeon: [name], MD;  Location: [facility] OR;  Service: Thoracic;  Laterality: Right;   ROTATOR CUFF REPAIR  10-24-05   had bone spur and laceration off cuff. Daldorf   XI ROBOTIC ASSISTED THORACOSCOPY- SEGMENTECTOMY Right 10/25/2019   Procedure: XI ROBOTIC ASSISTED THORACOSCOPY-SUPERIOR SEGMENTECTOMY;  Surgeon: [name], MD;  Location: [facility] OR;  Service: Thoracic;  Laterality: Right;    reports that she quit smoking about 27 years ago. Her smoking use included cigarettes. She started smoking about 52 years ago.  She has a 25 pack-year smoking history. She has been exposed to tobacco smoke. She has never used smokeless tobacco. She reports that she does not drink alcohol and does not use drugs. family history includes Aneurysm in her mother and another family member; Brain cancer in her brother; Cancer in her brother; Cancer (age of onset: 36) in her brother; Coronary  artery disease in her mother; Diabetes in her brother; Heart attack in her mother; Hyperlipidemia in her mother; Irritable bowel syndrome in her mother; Kidney cancer in her brother; Lung cancer in her brother and another family member; Stroke in her father and another family member. Allergies  Allergen Reactions   Codeine     Makes heart race   Penicillins     unknown   Sulfonamide Derivatives     Has a rash with the use of silvadiene for burn   Doxycycline      Stomach problems   Aspirin     Upset stomach, High dose only       Review of Systems  Constitutional:  Negative for chills, fever, malaise/fatigue and weight loss.  HENT:  Negative for hearing loss.   Eyes:  Negative for blurred vision and double vision.  Respiratory:  Negative for cough and shortness of breath.   Cardiovascular:  Negative for chest pain, palpitations and leg swelling.  Gastrointestinal:  Negative for abdominal pain, blood in stool, constipation and diarrhea.  Genitourinary:  Negative for dysuria.  Skin:  Negative for rash.  Neurological:  Negative for dizziness, speech change, seizures, loss of consciousness and headaches.  Psychiatric/Behavioral:  Negative for depression.       Objective:     BP 134/60 (BP Location: Left Arm, Patient Position: Sitting, Cuff Size: Normal)   Pulse 85   Temp 98 F (36.7 C) (Oral)   Ht 5' 5.75 (1.67 m)   Wt 132 lb 14.4 oz (60.3 kg)   SpO2 95%   BMI 21.62 kg/m  BP Readings from Last 3 Encounters:  08/11/23 134/60  01/08/23 136/68  12/16/22 138/69   Wt Readings from Last 3 Encounters:  08/11/23 132 lb 14.4  oz (60.3 kg)  01/08/23 133 lb (60.3 kg)  12/16/22 132 lb 4.8 oz (60 kg)      Physical Exam Vitals reviewed.  Constitutional:      Appearance: She is well-developed.  HENT:     Head: Normocephalic and atraumatic.   Eyes:     Pupils: Pupils are equal, round, and reactive to light.   Neck:     Thyroid : No thyromegaly.   Cardiovascular:     Rate and Rhythm: Normal rate and regular rhythm.     Heart sounds: Normal heart sounds. No murmur heard. Pulmonary:     Effort: No respiratory distress.     Breath sounds: Normal breath sounds. No wheezing or rales.  Abdominal:     General: Bowel sounds are normal. There is no distension.     Palpations: Abdomen is soft. There is no mass.     Tenderness: There is no abdominal tenderness. There is no guarding or rebound.   Musculoskeletal:        General: Normal range of motion.     Cervical back: Normal range of motion and neck supple.     Right lower leg: No edema.     Left lower leg: No edema.  Lymphadenopathy:     Cervical: No cervical adenopathy.   Skin:    Findings: No rash.   Neurological:     Mental Status: She is alert and oriented to person, place, and time.     Cranial Nerves: No cranial nerve deficit.   Psychiatric:        Behavior: Behavior normal.        Thought Content: Thought content normal.        Judgment: Judgment normal.  No results found for any visits on 08/11/23.  Last CBC Lab Results  Component Value Date   WBC 4.7 12/11/2022   HGB 12.2 12/11/2022   HCT 36.3 12/11/2022   MCV 94.0 12/11/2022   MCH 31.6 12/11/2022   RDW 12.4 12/11/2022   PLT 215 12/11/2022   Last metabolic panel Lab Results  Component Value Date   GLUCOSE 102 (H) 12/11/2022   NA 141 12/11/2022   K 4.5 12/11/2022   CL 105 12/11/2022   CO2 30 12/11/2022   BUN 16 12/11/2022   CREATININE 1.01 (H) 12/11/2022   GFRNONAA 58 (L) 12/11/2022   CALCIUM  9.7 12/11/2022   PROT 6.9 12/11/2022   ALBUMIN 4.2 12/11/2022   LABGLOB  2.0 08/25/2018   AGRATIO 2.3 (H) 08/25/2018   BILITOT 0.5 12/11/2022   ALKPHOS 53 12/11/2022   AST 28 12/11/2022   ALT 22 12/11/2022   ANIONGAP 6 12/11/2022      The 10-year ASCVD risk score (Arnett DK, et al., 2019) is: 24.7%    Assessment & Plan:   Physical exam.  78 year old female with history of adenocarcinoma of the lung treated with resection.  Follow-up CT scans of been stable.  Still followed closely by oncology.  We discussed several health maintenance issues as follows  -Obtain lipid panel.  She gets CBC and CMP yearly with oncology and will get those in the fall - Patient gets recurrent cold sores.  We gave prescription for Valtrex take 2 g at onset and repeat in 12 hours. - She takes hydroxyzine  fairly frequently for allergy symptoms and refill provided - Patient plans to set up repeat mammogram - Colonoscopy up-to-date - She had excellent DEXA scan back in 2020.  Continue regular calcium  and vitamin D and weightbearing exercise - Continue annual flu vaccine - Consider RSV vaccine if she has not had this already   No follow-ups on file.    [name], MD

## 2023-08-12 ENCOUNTER — Ambulatory Visit: Payer: Self-pay | Admitting: Family Medicine

## 2023-08-12 LAB — LIPID PANEL
Cholesterol: 137 mg/dL (ref 0–200)
HDL: 49.3 mg/dL (ref >39.00)
LDL Cholesterol: 45 mg/dL (ref 0–99)
NonHDL: 87.74
Total CHOL/HDL Ratio: 3
Triglycerides: 213 mg/dL — ABNORMAL HIGH (ref 0.0–149.0)
VLDL: 42.6 mg/dL — ABNORMAL HIGH (ref 0.0–40.0)

## 2023-08-13 NOTE — Progress Notes (Signed)
 Cardiology Office Note    Date:  08/20/2023   ID:  Teresa Booker, Teresa Booker [birth year], MRN [id number]  PCP:  [name] ORN, MD  Cardiologist: [name], MD    History of Present Illness:   78 y.o. significant partner to our nurse Niels Jest. History of HLD, palpitations with benign PVCls Rx beta blocker. Normal EF and mild MVP no significant MR Prednisone  and caffeine make her palpitations worse   Calcium  score 04/05/19 109 which is 71 st percentile for age and sex isolated to LAD On crestor  with LDL at goal 69 02/14/21    Nodule in superior segment of RLL She eventually had robotic RLL segmentectomy by Dr Kerrin 10/25/19  for T1N0 sage 1A adenocarcinoma  No cardiac symptoms Some paresthesias over surgical incision from thoracotomy  Concerned about ground glass nodules on CT but they are chronic and no evidence of metastatic dx 12/12/22   Has recurrent cold sores Rx with Valtrex   Having some neuropathic pain in feet Using Mg   Past Medical History:  Diagnosis Date   Alcohol abuse    - abstemious 20 years. Attends AA - strong recovery    Allergic reaction    Allergy    seasonal   Anxiety    COPD (chronic obstructive pulmonary disease) (HCC)    minor   Family history of adverse reaction to anesthesia    mother had problems with n/v   Fibromyalgia    slight   GERD (gastroesophageal reflux disease)    HSV-1 infection    Hyperlipemia    Hypertension    IBS (irritable bowel syndrome)    NSCL Ca dx'd 10/24/20   Postnasal drip    RBBB (right bundle branch block)    Rotator cuff syndrome    right    Past Surgical History:  Procedure Laterality Date   COLONOSCOPY     INTERCOSTAL NERVE BLOCK Right 10/25/2019   Procedure: INTERCOSTAL NERVE BLOCK;  Surgeon: [name], MD;  Location: [facility] OR;  Service: Thoracic;  Laterality: Right;   MOUTH SURGERY  2002   NODE DISSECTION Right 10/25/2019   Procedure: NODE DISSECTION;  Surgeon: [name], MD;   Location: [facility] OR;  Service: Thoracic;  Laterality: Right;   ROTATOR CUFF REPAIR  10-24-05   had bone spur and laceration off cuff. Daldorf   XI ROBOTIC ASSISTED THORACOSCOPY- SEGMENTECTOMY Right 10/25/2019   Procedure: XI ROBOTIC ASSISTED THORACOSCOPY-SUPERIOR SEGMENTECTOMY;  Surgeon: [name], MD;  Location: [facility] OR;  Service: Thoracic;  Laterality: Right;    Current Medications: Current Meds  Medication Sig   acyclovir  (ZOVIRAX ) 400 MG tablet Take 1 tablet daily as needed for cold sores   aspirin EC 81 MG tablet Take 81 mg by mouth daily. Swallow whole.   Calcium  200 MG TABS Take 200 mg by mouth every evening.    diphenhydramine-acetaminophen  (TYLENOL  PM EXTRA STRENGTH) 25-500 MG TABS tablet Take 1 tablet by mouth at bedtime as needed.   ECHINACEA COMPLEX PO Take 1 tablet by mouth daily.   hydrOXYzine  (ATARAX ) 25 MG tablet Take 1 tablet (25 mg total) by mouth every 8 (eight) hours as needed.   hyoscyamine  (LEVBID ) 0.375 MG 12 hr tablet Take 1 tablet (0.375 mg total) by mouth 2 (two) times daily.   L-Lysine  500 MG TABS Take 500 mg by mouth daily.   loratadine  (CLARITIN ) 10 MG tablet Take 1 tablet (10 mg total) by mouth daily.   Magnesium  250 MG TABS Take 250 mg  by mouth every evening.    methocarbamol  (ROBAXIN ) 500 MG tablet Take 1 tablet (500 mg total) by mouth every 6 (six) hours as needed for muscle spasms.   metoprolol  succinate (TOPROL -XL) 50 MG 24 hr tablet TAKE 1 TABLET BY MOUTH ONCE DAILY WITH A MEAL OR  IMMEDIATELY  FOLLOWING  A  MEAL   Multiple Vitamin (MULTIVITAMIN WITH MINERALS) TABS tablet Take 1 tablet by mouth daily.   omeprazole (PRILOSEC) 20 MG capsule Take 20 mg by mouth daily before breakfast.   POTASSIUM PO Take 1 tablet by mouth daily.   rosuvastatin  (CRESTOR ) 10 MG tablet TAKE 1 TABLET BY MOUTH AT BEDTIME   valACYclovir  (VALTREX ) 1000 MG tablet Take two at onset of cold sores and repeat two in 12 hours.     Allergies:   Codeine, Penicillins, Sulfonamide  derivatives, Doxycycline , and Aspirin   Social History   Socioeconomic History   Marital status: Single    Spouse name: Not on file   Number of children: 0   Years of education: 16   Highest education level: Bachelor's degree (e.g., BA, AB, BS)  Occupational History   Occupation: Production designer, theatre/television/film    Comment: retired  Tobacco Use   Smoking status: Former    Current packs/day: 0.00    Average packs/day: 1 pack/day for 25.0 years (25.0 ttl pk-yrs)    Types: Cigarettes    Start date: 03/13/1971    Quit date: 03/12/1996    Years since quitting: 27.4    Passive exposure: Past   Smokeless tobacco: Never  Vaping Use   Vaping status: Never Used  Substance and Sexual Activity   Alcohol use: No    Comment: former user AA 20+yrs ago strong recovery   Drug use: No   Sexual activity: Not Currently    Partners: Female    Birth control/protection: Post-menopausal    Comment: older than 18, less than 5  Other Topics Concern   Not on file  Social History Narrative   03/30/19:   HH 2   [employer]   Long-term relationship - '95   1 cat   Social Drivers of Corporate investment banker Strain: Low Risk  (10/22/2021)   Overall Financial Resource Strain (CARDIA)    Difficulty of Paying Living Expenses: Not hard at all  Food Insecurity: No Food Insecurity (08/06/2023)   Hunger Vital Sign    Worried About Running Out of Food in the Last Year: Never true    Ran Out of Food in the Last Year: Never true  Transportation Needs: No Transportation Needs (08/06/2023)   PRAPARE - Administrator, Civil Service (Medical): No    Lack of Transportation (Non-Medical): No  Physical Activity: Insufficiently Active (08/06/2023)   Exercise Vital Sign    Days of Exercise per Week: 4 days    Minutes of Exercise per Session: 20 min  Stress: No Stress Concern Present (08/06/2023)   [employer] of Occupational Health - Occupational Stress Questionnaire    Feeling of Stress: Only a little  Social  Connections: Moderately Isolated (08/06/2023)   Social Connection and Isolation Panel    Frequency of Communication with Friends and Family: Twice a week    Frequency of Social Gatherings with Friends and Family: Once a week    Attends Religious Services: Never    Database administrator or Organizations: No    Attends Engineer, structural: Not on file    Marital Status: Living with partner  Family History:  The patient's   family history includes Aneurysm in her mother and another family member; Brain cancer in her brother; Cancer in her brother; Cancer (age of onset: 28) in her brother; Coronary artery disease in her mother; Diabetes in her brother; Heart attack in her mother; Hyperlipidemia in her mother; Irritable bowel syndrome in her mother; Kidney cancer in her brother; Lung cancer in her brother and another family member; Stroke in her father and another family member.   ROS:   Please see the history of present illness.    ROS All other systems reviewed and are negative.   PHYSICAL EXAM:   VS:  BP (!) 138/54   Pulse 76   Ht 5' 5 (1.651 m)   Wt 131 lb 12.8 oz (59.8 kg)   SpO2 98%   BMI 21.93 kg/m    Affect appropriate Healthy:  appears stated age HEENT: normal Neck supple with no adenopathy JVP normal no bruits no thyromegaly Lungs clear post right thoracotomy robotic  Heart:  S1/S2 no murmur, no rub, gallop or click PMI normal Abdomen: benighn, BS positve, no tenderness, no AAA no bruit.  No HSM or HJR Distal pulses intact with no bruits No edema Neuro non-focal Skin warm and dry No muscular weakness  Wt Readings from Last 3 Encounters:  08/20/23 131 lb 12.8 oz (59.8 kg)  08/11/23 132 lb 14.4 oz (60.3 kg)  01/08/23 133 lb (60.3 kg)      Studies/Labs Reviewed:   EKG:   08/25/18 NSR rate 76 nonspecific ST changes with frequent PVC's 08/20/2023 SR rate 66 normal   Recent Labs: 12/11/2022: ALT 22; BUN 16; Creatinine 1.01; Hemoglobin 12.2; Platelet  Count 215; Potassium 4.5; Sodium 141   Lipid Panel    Component Value Date/Time   CHOL 137 08/11/2023 1558   CHOL 135 09/03/2021 0922   TRIG 213.0 (H) 08/11/2023 1558   HDL 49.30 08/11/2023 1558   HDL 55 09/03/2021 0922   CHOLHDL 3 08/11/2023 1558   VLDL 42.6 (H) 08/11/2023 1558   LDLCALC 45 08/11/2023 1558   LDLCALC 64 09/03/2021 0922    Additional studies/ records that were reviewed today include:  NST 05/06/2017  Nuclear stress EF: 70%. There was no ST segment deviation noted during stress. Blood pressure demonstrated a hypertensive response to exercise. The study is normal. This is a low risk study. The left ventricular ejection fraction is hyperdynamic (>65%).   Normal exercise nuclear stress test with no evidence for prior infarct or ischemia.  Normal LVEF. Poor exercise capacity. Hypertensive response to exercise.   2D echo 3/26/2019Study Conclusions   - Left ventricle: The cavity size was normal. Systolic function was   normal. The estimated ejection fraction was in the range of 60%   to 65%. Wall motion was normal; there were no regional wall   motion abnormalities. Left ventricular diastolic function   parameters were normal. - Mitral valve: Mild, late systolicprolapse, involving the anterior   leaflet. There was no significant regurgitation. - Pulmonary arteries: PA peak pressure: 32 mm Hg (S).   Holter monitor 04/2017 Sinus rhythm PAC;s/ PVC;s No significant arrhythmias       ASSESSMENT:    Palpitations, HLD, Pulmonary Nodule   PLAN:  In order of problems listed above:   Palpitations Benign in setting of no obstructive CAD normal EF RX beta blockers with improvement Limit Caffeine   Hyperlipidemia on Crestor  LDL at goal 45 08/11/23 triglycerides high discussed low fat diet   Pulmonary:  F/U Dr Brenna and Kerrin Post segmental lobectomy on right for adenocarcinoma stage 1AN0 Chest CT 12/11/22    unchanged post op no new lesions Discussed  stability of these ground glass nodules on multiple CT scans and no evidence of new metastatic dx   F/U with me in a year   Signed, [name], MD  08/20/2023 8:42 AM    [facility] [address] [place], [place], [place]  [address] Phone: [phone]; Fax: [phone]

## 2023-08-19 ENCOUNTER — Other Ambulatory Visit: Payer: Self-pay | Admitting: Family Medicine

## 2023-08-19 DIAGNOSIS — Z1231 Encounter for screening mammogram for malignant neoplasm of breast: Secondary | ICD-10-CM

## 2023-08-20 ENCOUNTER — Encounter: Payer: Self-pay | Admitting: Cardiovascular Disease

## 2023-08-20 ENCOUNTER — Ambulatory Visit: Attending: Cardiovascular Disease | Admitting: Cardiovascular Disease

## 2023-08-20 VITALS — BP 138/54 | HR 76 | Ht 65.0 in | Wt 131.8 lb

## 2023-08-20 DIAGNOSIS — C3491 Malignant neoplasm of unspecified part of right bronchus or lung: Secondary | ICD-10-CM | POA: Diagnosis not present

## 2023-08-20 DIAGNOSIS — R002 Palpitations: Secondary | ICD-10-CM | POA: Diagnosis not present

## 2023-08-20 DIAGNOSIS — E785 Hyperlipidemia, unspecified: Secondary | ICD-10-CM | POA: Diagnosis not present

## 2023-08-20 NOTE — Patient Instructions (Signed)

## 2023-08-25 ENCOUNTER — Ambulatory Visit (INDEPENDENT_AMBULATORY_CARE_PROVIDER_SITE_OTHER)

## 2023-08-25 ENCOUNTER — Encounter: Payer: Self-pay | Admitting: Family Medicine

## 2023-08-25 ENCOUNTER — Ambulatory Visit (INDEPENDENT_AMBULATORY_CARE_PROVIDER_SITE_OTHER): Admitting: Family Medicine

## 2023-08-25 VITALS — BP 120/60 | HR 68 | Temp 97.5°F | Ht 65.0 in | Wt 131.0 lb

## 2023-08-25 VITALS — BP 120/60 | HR 68 | Temp 97.5°F | Wt 131.0 lb

## 2023-08-25 DIAGNOSIS — L03011 Cellulitis of right finger: Secondary | ICD-10-CM | POA: Diagnosis not present

## 2023-08-25 DIAGNOSIS — Z0000 Encounter for general adult medical examination without abnormal findings: Secondary | ICD-10-CM | POA: Diagnosis not present

## 2023-08-25 MED ORDER — CEPHALEXIN 500 MG PO CAPS: 500.0000 mg | ORAL_CAPSULE | Freq: Four times a day (QID) | ORAL | 0 refills | Status: AC

## 2023-08-25 NOTE — Progress Notes (Signed)
 Subjective:   Teresa Booker is a 78 y.o. who presents for a Medicare Wellness preventive visit.  As a reminder, Annual Wellness Visits don't include a physical exam, and some assessments may be limited, especially if this visit is performed virtually. We may recommend an in-person follow-up visit with your provider if needed.  Visit Complete: In person    Persons Participating in Visit: Patient.  AWV Questionnaire: No: Patient Medicare AWV questionnaire was not completed prior to this visit.  Cardiac Risk Factors include: advanced age (>59men, >65 women)     Objective:    Today's Vitals   08/25/23 0835  BP: 120/60  Pulse: 68  Temp: (!) 97.5 F (36.4 C)  TempSrc: Oral  SpO2: 97%  Weight: 131 lb (59.4 kg)  Height: 5' 5 (1.651 m)   Body mass index is 21.8 kg/m.     08/25/2023    8:52 AM 04/02/2021    4:01 PM 12/07/2020    8:39 AM 05/01/2020    3:06 PM 10/25/2019    6:11 AM 10/15/2019    9:12 AM 03/30/2019   10:09 AM  Advanced Directives  Does Patient Have a Medical Advance Directive? Yes Yes Yes Yes Yes Yes Yes  Type of Estate agent of [place];Living will Healthcare Power of [place];Living will Healthcare Power of [place];Living will Healthcare Power of [place];Living will Healthcare Power of [place];Living will Healthcare Power of [place];Living will Healthcare Power of [place];Living will  Does patient want to make changes to medical advance directive? No - Patient declined   No - Patient declined No - Patient declined No - Patient declined No - Patient declined  Copy of Healthcare Power of Attorney in Chart? Yes - validated most recent copy scanned in chart (See row information) Yes - validated most recent copy scanned in chart (See row information) No - copy requested Yes - validated most recent copy scanned in chart (See row information) No - copy requested No - copy requested No - copy requested    Current Medications (verified) Outpatient  Encounter Medications as of 08/25/2023  Medication Sig   acyclovir  (ZOVIRAX ) 400 MG tablet Take 1 tablet daily as needed for cold sores   aspirin EC 81 MG tablet Take 81 mg by mouth daily. Swallow whole.   Calcium  200 MG TABS Take 200 mg by mouth every evening.    diphenhydramine-acetaminophen  (TYLENOL  PM EXTRA STRENGTH) 25-500 MG TABS tablet Take 1 tablet by mouth at bedtime as needed.   ECHINACEA COMPLEX PO Take 1 tablet by mouth daily.   hydrOXYzine  (ATARAX ) 25 MG tablet Take 1 tablet (25 mg total) by mouth every 8 (eight) hours as needed.   hyoscyamine  (LEVBID ) 0.375 MG 12 hr tablet Take 1 tablet (0.375 mg total) by mouth 2 (two) times daily.   L-Lysine  500 MG TABS Take 500 mg by mouth daily.   loratadine  (CLARITIN ) 10 MG tablet Take 1 tablet (10 mg total) by mouth daily.   Magnesium  250 MG TABS Take 250 mg by mouth every evening.    methocarbamol  (ROBAXIN ) 500 MG tablet Take 1 tablet (500 mg total) by mouth every 6 (six) hours as needed for muscle spasms.   metoprolol  succinate (TOPROL -XL) 50 MG 24 hr tablet TAKE 1 TABLET BY MOUTH ONCE DAILY WITH A MEAL OR  IMMEDIATELY  FOLLOWING  A  MEAL   Multiple Vitamin (MULTIVITAMIN WITH MINERALS) TABS tablet Take 1 tablet by mouth daily.   omeprazole (PRILOSEC) 20 MG capsule Take 20 mg by mouth  daily before breakfast.   POTASSIUM PO Take 1 tablet by mouth daily.   rosuvastatin  (CRESTOR ) 10 MG tablet TAKE 1 TABLET BY MOUTH AT BEDTIME   valACYclovir  (VALTREX ) 1000 MG tablet Take two at onset of cold sores and repeat two in 12 hours.   No facility-administered encounter medications on file as of 08/25/2023.    Allergies (verified) Codeine, Penicillins, Sulfonamide derivatives, Doxycycline , and Aspirin   History: Past Medical History:  Diagnosis Date   Alcohol abuse    - abstemious 20 years. Attends AA - strong recovery    Allergic reaction    Allergy    seasonal   Anxiety    COPD (chronic obstructive pulmonary disease) (HCC)    minor    Family history of adverse reaction to anesthesia    mother had problems with n/v   Fibromyalgia    slight   GERD (gastroesophageal reflux disease)    HSV-1 infection    Hyperlipemia    Hypertension    IBS (irritable bowel syndrome)    NSCL Ca dx'd 10/24/20   Postnasal drip    RBBB (right bundle branch block)    Rotator cuff syndrome    right   Past Surgical History:  Procedure Laterality Date   COLONOSCOPY     INTERCOSTAL NERVE BLOCK Right 10/25/2019   Procedure: INTERCOSTAL NERVE BLOCK;  Surgeon: [name], MD;  Location: [facility] OR;  Service: Thoracic;  Laterality: Right;   MOUTH SURGERY  2002   NODE DISSECTION Right 10/25/2019   Procedure: NODE DISSECTION;  Surgeon: [name], MD;  Location: [facility] OR;  Service: Thoracic;  Laterality: Right;   ROTATOR CUFF REPAIR  10-24-05   had bone spur and laceration off cuff. Daldorf   XI ROBOTIC ASSISTED THORACOSCOPY- SEGMENTECTOMY Right 10/25/2019   Procedure: XI ROBOTIC ASSISTED THORACOSCOPY-SUPERIOR SEGMENTECTOMY;  Surgeon: [name], MD;  Location: [facility] OR;  Service: Thoracic;  Laterality: Right;   Family History  Problem Relation Age of Onset   Hyperlipidemia Mother    Irritable bowel syndrome Mother    Coronary artery disease Mother    Heart attack Mother    Aneurysm Mother        brain   Stroke Father    Diabetes Brother    Cancer Brother        small cell lung ca with mets/ smoker   Brain cancer Brother    Lung cancer Brother    Cancer Brother 83       liver cancer ?primary   Kidney cancer Brother    Stroke Other    Aneurysm Other    Lung cancer Other    Colon cancer Neg Hx    Esophageal cancer Neg Hx    Pancreatic cancer Neg Hx    Stomach cancer Neg Hx    Social History   Socioeconomic History   Marital status: Single    Spouse name: Not on file   Number of children: 0   Years of education: 16   Highest education level: Bachelor's degree (e.g., BA, AB, BS)  Occupational History    Occupation: Production designer, theatre/television/film    Comment: retired  Tobacco Use   Smoking status: Former    Current packs/day: 0.00    Average packs/day: 1 pack/day for 25.0 years (25.0 ttl pk-yrs)    Types: Cigarettes    Start date: 03/13/1971    Quit date: 03/12/1996    Years since quitting: 27.4    Passive exposure: Past   Smokeless tobacco: Never  Vaping Use   Vaping status: Never Used  Substance and Sexual Activity   Alcohol use: No    Comment: former user AA 20+yrs ago strong recovery   Drug use: No   Sexual activity: Not Currently    Partners: Female    Birth control/protection: Post-menopausal    Comment: older than 18, less than 5  Other Topics Concern   Not on file  Social History Narrative   03/30/19:   HH 2   [employer]   Long-term relationship - '95   1 cat   Social Drivers of Corporate investment banker Strain: Low Risk  (08/25/2023)   Overall Financial Resource Strain (CARDIA)    Difficulty of Paying Living Expenses: Not hard at all  Food Insecurity: No Food Insecurity (08/25/2023)   Hunger Vital Sign    Worried About Running Out of Food in the Last Year: Never true    Ran Out of Food in the Last Year: Never true  Transportation Needs: No Transportation Needs (08/25/2023)   PRAPARE - Administrator, Civil Service (Medical): No    Lack of Transportation (Non-Medical): No  Physical Activity: Sufficiently Active (08/25/2023)   Exercise Vital Sign    Days of Exercise per Week: 5 days    Minutes of Exercise per Session: 30 min  Recent Concern: Physical Activity - Insufficiently Active (08/06/2023)   Exercise Vital Sign    Days of Exercise per Week: 4 days    Minutes of Exercise per Session: 20 min  Stress: No Stress Concern Present (08/25/2023)   [employer] of Occupational Health - Occupational Stress Questionnaire    Feeling of Stress: Not at all  Social Connections: Moderately Integrated (08/25/2023)   Social Connection and Isolation Panel    Frequency of  Communication with Friends and Family: More than three times a week    Frequency of Social Gatherings with Friends and Family: More than three times a week    Attends Religious Services: Never    Database administrator or Organizations: No    Attends Engineer, structural: More than 4 times per year    Marital Status: Living with partner  Recent Concern: Social Connections - Moderately Isolated (08/06/2023)   Social Connection and Isolation Panel    Frequency of Communication with Friends and Family: Twice a week    Frequency of Social Gatherings with Friends and Family: Once a week    Attends Religious Services: Never    Database administrator or Organizations: No    Attends Engineer, structural: Not on file    Marital Status: Living with partner    Tobacco Counseling Counseling given: Not Answered    Clinical Intake:  Pre-visit preparation completed: Yes  Pain : No/denies pain     BMI - recorded: 21.8 Nutritional Status: BMI of 19-24  Normal Nutritional Risks: None Diabetes: No  Lab Results  Component Value Date   HGBA1C 5.9 (H) 03/03/2019   HGBA1C 5.6 11/20/2018   HGBA1C 5.6 11/20/2018   HGBA1C 5.6 (A) 11/20/2018   HGBA1C 5.6 11/20/2018     How often do you need to have someone help you when you read instructions, pamphlets, or other written materials from your doctor or pharmacy?: 1 - Never  Interpreter Needed?: No  Information entered by :: Rojelio Blush LPN   Activities of Daily Living      08/25/2023    8:50 AM  In your present state of health, do  you have any difficulty performing the following activities:  Hearing? 0  Vision? 0  Difficulty concentrating or making decisions? 0  Walking or climbing stairs? 0  Dressing or bathing? 0  Doing errands, shopping? 0  Preparing Food and eating ? N  Using the Toilet? N  In the past six months, have you accidently leaked urine? N  Do you have problems with loss of bowel control? N   Managing your Medications? N  Managing your Finances? N  Housekeeping or managing your Housekeeping? N    Patient Care Team: [name] ORN, MD as PCP - General (Family Medicine) Delford Maude BROCKS, MD as PCP - Cardiology (Cardiology)   I have updated your Care Teams any recent Medical Services you may have received from other providers in the past year.     Assessment:   This is a routine wellness examination for Teresa Booker.  Hearing/Vision screen Hearing Screening - Comments:: Denies hearing difficulties   Vision Screening - Comments:: Wears rx glasses - up to date with [facility] exams with  [facility]   Goals Addressed               This Visit's Progress     Continue Swimming (pt-stated)         Depression Screen      08/25/2023    8:37 AM 08/11/2023    3:10 PM 05/23/2022    8:38 AM 04/12/2022   10:19 AM 10/23/2021   10:46 AM 04/02/2021    4:02 PM 05/01/2020    3:10 PM  PHQ 2/9 Scores  PHQ - 2 Score 0 0 0 0 0 0 0  PHQ- 9 Score   0 4 1      Fall Risk      08/25/2023    8:51 AM 05/23/2022    8:38 AM 04/12/2022   10:20 AM 10/23/2021   10:46 AM 10/22/2021   10:59 AM  Fall Risk   Falls in the past year? 1 0 0 1 1  Number falls in past yr: 0 0 0 0 0  Injury with Fall? 0 0 0 1 1  Risk for fall due to : No Fall Risks No Fall Risks No Fall Risks No Fall Risks   Follow up Falls evaluation completed Falls evaluation completed Falls evaluation completed Falls evaluation completed       Data saved with a previous flowsheet row definition    MEDICARE RISK AT HOME:   Medicare Risk at Home Any stairs in or around the home?: Yes If so, are there any without handrails?: No Home free of loose throw rugs in walkways, pet beds, electrical cords, etc?: Yes Adequate lighting in your home to reduce risk of falls?: Yes Life alert?: No Use of a cane, walker or w/c?: No Grab bars in the bathroom?: No Shower chair or bench in shower?: No Elevated toilet seat or a handicapped  toilet?: No  TIMED UP AND GO:  Was the test performed?  Yes  Length of time to ambulate 10 feet: 10 sec Gait steady and fast without use of assistive device  Cognitive Function: 6CIT completed        08/25/2023    8:52 AM 04/02/2021    4:03 PM 03/30/2019   10:17 AM 03/30/2019   10:12 AM  6CIT Screen  What Year? 0 points 0 points 0 points 0 points  What month? 0 points 0 points 0 points 0 points  What time? 0 points 0 points  0 points 0 points  Count back from 20 0 points 0 points 0 points 0 points  Months in reverse 0 points 0 points 0 points 0 points  Repeat phrase 0 points 2 points 0 points 0 points  Total Score 0 points 2 points 0 points 0 points    Immunizations Immunization History  Administered Date(s) Administered   Fluad Quad(high Dose 65+) 12/06/2020   Influenza Split 11/29/2010   Influenza, High Dose Seasonal PF 11/27/2018, 11/28/2021   Influenza,inj,Quad PF,6+ Mos 01/11/2014, 12/06/2016   Influenza-Unspecified 12/22/2014, 12/19/2015, 11/26/2017, 12/03/2019, 12/18/2022   PFIZER(Purple Top)SARS-COV-2 Vaccination 03/18/2019, 04/12/2019, 11/29/2019, 07/27/2020   Pfizer(Comirnaty)Fall Seasonal Vaccine 12 years and older 12/03/2021, 11/15/2022   Pneumococcal Polysaccharide-23 01/23/2009   Rsv, Bivalent, Protein Subunit Rsvpref,pf (Abrysvo) 05/03/2022   Td 01/23/2009, 11/20/2018   Zoster, Live 07/10/2010    Screening Tests Health Maintenance  Topic Date Due   Zoster Vaccines- Shingrix (1 of 2) 06/07/1966   COVID-19 Vaccine (7 - Pfizer risk 2024-25 season) 05/16/2023   INFLUENZA VACCINE  09/12/2023   MAMMOGRAM  10/07/2023   Medicare Annual Wellness (AWV)  08/24/2024   Colonoscopy  10/19/2025   DTaP/Tdap/Td (3 - Tdap) 11/19/2028   DEXA SCAN  Completed   Hepatitis C Screening  Completed   Hepatitis B Vaccines  Aged Out   HPV VACCINES  Aged Out   Meningococcal B Vaccine  Aged Out   Pneumococcal Vaccine: 50+ Years  Discontinued    Health Maintenance  Health  Maintenance Due  Topic Date Due   Zoster Vaccines- Shingrix (1 of 2) 06/07/1966   COVID-19 Vaccine (7 - Pfizer risk 2024-25 season) 05/16/2023   Health Maintenance Items Addressed:   Additional Screening:  Vision Screening: Recommended annual ophthalmology exams for early detection of glaucoma and other disorders of the eye. Would you like a referral to an eye doctor? No    Dental Screening: Recommended annual dental exams for proper oral hygiene  Community Resource Referral / Chronic Care Management: CRR required this visit?  No   CCM required this visit?  No   Plan:    I have personally reviewed and noted the following in the patient's chart:   Medical and social history Use of alcohol, tobacco or illicit drugs  Current medications and supplements including opioid prescriptions. Patient is not currently taking opioid prescriptions. Functional ability and status Nutritional status Physical activity Advanced directives List of other physicians Hospitalizations, surgeries, and ER visits in previous 12 months Vitals Screenings to include cognitive, depression, and falls Referrals and appointments  In addition, I have reviewed and discussed with patient certain preventive protocols, quality metrics, and best practice recommendations. A written personalized care plan for preventive services as well as general preventive health recommendations were provided to patient.   Rojelio LELON Blush, LPN   [id number]/[id number]   After Visit Summary: (In Person-Printed) AVS printed and given to the patient  Notes: Nothing significant to report at this time.

## 2023-08-25 NOTE — Progress Notes (Signed)
 Established Patient Office Visit  Subjective   Patient ID: Teresa Booker, female    DOB: 1945-08-27  Age: 78 y.o. MRN: [id number]  Chief Complaint  Patient presents with   Finger Injury    HPI   Teresa Booker is seen today as a work in with swollen painful right thumb.  She was actually here for Medicare wellness visit and had brought this up.  She does recall working over the weekend around some rosebushes but does not recall any definite puncture wound.  She first noticed some swelling on Saturday.  She tried some elevation and icing.  Denies any fever or chills.  She also thought she may have had a hangnail initially involving the thumb.  She also consider possibility of foreign body.  Tetanus is up-to-date.  She does have multiple allergies reported including penicillin, sulfa , and doxycycline .  Has tolerated Ceftin .  No history of anaphylaxis with penicillin.  Past Medical History:  Diagnosis Date   Alcohol abuse    - abstemious 20 years. Attends AA - strong recovery    Allergic reaction    Allergy    seasonal   Anxiety    COPD (chronic obstructive pulmonary disease) (HCC)    minor   Family history of adverse reaction to anesthesia    mother had problems with n/v   Fibromyalgia    slight   GERD (gastroesophageal reflux disease)    HSV-1 infection    Hyperlipemia    Hypertension    IBS (irritable bowel syndrome)    NSCL Ca dx'd 10/24/20   Postnasal drip    RBBB (right bundle branch block)    Rotator cuff syndrome    right   Past Surgical History:  Procedure Laterality Date   COLONOSCOPY     INTERCOSTAL NERVE BLOCK Right 10/25/2019   Procedure: INTERCOSTAL NERVE BLOCK;  Surgeon: [name], MD;  Location: [facility] OR;  Service: Thoracic;  Laterality: Right;   MOUTH SURGERY  2002   NODE DISSECTION Right 10/25/2019   Procedure: NODE DISSECTION;  Surgeon: [name], MD;  Location: [facility] OR;  Service: Thoracic;  Laterality: Right;   ROTATOR CUFF REPAIR  10-24-05    had bone spur and laceration off cuff. Daldorf   XI ROBOTIC ASSISTED THORACOSCOPY- SEGMENTECTOMY Right 10/25/2019   Procedure: XI ROBOTIC ASSISTED THORACOSCOPY-SUPERIOR SEGMENTECTOMY;  Surgeon: [name], MD;  Location: [facility] OR;  Service: Thoracic;  Laterality: Right;    reports that she quit smoking about 27 years ago. Her smoking use included cigarettes. She started smoking about 52 years ago. She has a 25 pack-year smoking history. She has been exposed to tobacco smoke. She has never used smokeless tobacco. She reports that she does not drink alcohol and does not use drugs. family history includes Aneurysm in her mother and another family member; Brain cancer in her brother; Cancer in her brother; Cancer (age of onset: 34) in her brother; Coronary artery disease in her mother; Diabetes in her brother; Heart attack in her mother; Hyperlipidemia in her mother; Irritable bowel syndrome in her mother; Kidney cancer in her brother; Lung cancer in her brother and another family member; Stroke in her father and another family member. Allergies  Allergen Reactions   Codeine     Makes heart race   Penicillins     unknown   Sulfonamide Derivatives     Has a rash with the use of silvadiene for burn   Doxycycline      Stomach problems  Aspirin     Upset stomach, High dose only    Review of Systems  Constitutional:  Negative for chills and fever.      Objective:     BP 120/60 (BP Location: Left Arm, Patient Position: Sitting, Cuff Size: Normal)   Pulse 68   Temp (!) 97.5 F (36.4 C) (Oral)   Wt 131 lb (59.4 kg)   SpO2 97%   BMI 21.80 kg/m  BP Readings from Last 3 Encounters:  08/25/23 120/60  08/25/23 120/60  08/20/23 (!) 138/54   Wt Readings from Last 3 Encounters:  08/25/23 131 lb (59.4 kg)  08/25/23 131 lb (59.4 kg)  08/20/23 131 lb 12.8 oz (59.8 kg)      Physical Exam Vitals reviewed.  Constitutional:      General: She is not in acute distress.    Appearance:  She is not ill-appearing.  Skin:    Comments: Right thumb reveals some swelling and erythema and mild warmth involving the volar surface and extending along to the medial aspect of the nail.  No visible abscess.  No fluctuance.  Tender to palpation.  No evidence for felon.  No visible or palpated foreign body.  Neurological:     Mental Status: She is alert.      No results found for any visits on 08/25/23.    The 10-year ASCVD risk score (Arnett DK, et al., 2019) is: 20.2%    Assessment & Plan:   Cellulitis changes right thumb.  No obvious foreign body.  Multiple reported drug allergies as above.  Has tolerated Ceftin  without difficulty.  Will start Keflex  500 mg 4 times daily for 7 days.  Recommend warm salt water soaks.  Follow-up for any fever, increased warmth, swelling, or erythema.  [name], MD

## 2023-08-25 NOTE — Patient Instructions (Signed)
 Elevate finger frequently  Continue with frequent warm salt water soaks  Follow up for any progressive redness, warmth, or swelling.

## 2023-08-25 NOTE — Patient Instructions (Signed)
 Teresa Booker , Thank you for taking time out of your busy schedule to complete your Annual Wellness Visit with me. I enjoyed our conversation and look forward to speaking with you again next year. I, as well as your care team,  appreciate your ongoing commitment to your health goals. Please review the following plan we discussed and let me know if I can assist you in the future. Your Game plan/ To Do List    Referrals: If you haven't heard from the office you've been referred to, please reach out to them at the phone provided.    Follow up Visits: Next Medicare AWV with our clinical staff: 08/30/24 @ 8:40a   Have you seen your provider in the last 6 months (3 months if uncontrolled diabetes)? 530/25 Next Office Visit with your provider:    Clinician Recommendations:  Aim for 30 minutes of exercise or brisk walking, 6-8 glasses of water, and 5 servings of fruits and vegetables each day.        This is a list of the screening recommended for you and due dates:  Health Maintenance  Topic Date Due   Zoster (Shingles) Vaccine (1 of 2) 06/07/1966   COVID-19 Vaccine (7 - Pfizer risk 2024-25 season) 05/16/2023   Flu Shot  09/12/2023   Mammogram  10/07/2023   Medicare Annual Wellness Visit  08/24/2024   Colon Cancer Screening  10/19/2025   DTaP/Tdap/Td vaccine (3 - Tdap) 11/19/2028   DEXA scan (bone density measurement)  Completed   Hepatitis C Screening  Completed   Hepatitis B Vaccine  Aged Out   HPV Vaccine  Aged Out   Meningitis B Vaccine  Aged Out   Pneumococcal Vaccine for age over 76  Discontinued    Advanced directives: (In Chart) A copy of your advanced directives are scanned into your chart should your provider ever need it. Advance Care Planning is important because it:  [x]  Makes sure you receive the medical care that is consistent with your values, goals, and preferences  [x]  It provides guidance to your family and loved ones and reduces their decisional burden about whether or  not they are making the right decisions based on your wishes.  Follow the link provided in your after visit summary or read over the paperwork we have mailed to you to help you started getting your Advance Directives in place. If you need assistance in completing these, please reach out to us  so that we can help you!  See attachments for Preventive Care and Fall Prevention Tips.

## 2023-10-03 DIAGNOSIS — Z0100 Encounter for examination of eyes and vision without abnormal findings: Secondary | ICD-10-CM | POA: Diagnosis not present

## 2023-10-03 DIAGNOSIS — H43393 Other vitreous opacities, bilateral: Secondary | ICD-10-CM | POA: Diagnosis not present

## 2023-10-08 ENCOUNTER — Ambulatory Visit
Admission: RE | Admit: 2023-10-08 | Discharge: 2023-10-08 | Disposition: A | Discharge status: Home / Self Care | Source: Ambulatory Visit

## 2023-10-08 DIAGNOSIS — Z1231 Encounter for screening mammogram for malignant neoplasm of breast: Secondary | ICD-10-CM

## 2023-10-27 ENCOUNTER — Encounter: Payer: Self-pay | Admitting: Internal Medicine

## 2023-11-03 ENCOUNTER — Other Ambulatory Visit: Payer: Self-pay | Admitting: Family Medicine

## 2023-11-28 ENCOUNTER — Ambulatory Visit (INDEPENDENT_AMBULATORY_CARE_PROVIDER_SITE_OTHER): Admitting: Family Medicine

## 2023-11-28 ENCOUNTER — Encounter: Payer: Self-pay | Admitting: Family Medicine

## 2023-11-28 VITALS — BP 134/60 | HR 74 | Temp 97.9°F | Wt 132.2 lb

## 2023-11-28 DIAGNOSIS — J069 Acute upper respiratory infection, unspecified: Secondary | ICD-10-CM | POA: Diagnosis not present

## 2023-11-28 NOTE — Progress Notes (Signed)
 Established Patient Office Visit  Subjective   Patient ID: Teresa Booker, female    DOB: 1945-02-22  Age: 78 y.o. MRN: [id number]  Chief Complaint  Patient presents with   Sinusitis    HPI    [name] is seen today as a work in with 3-day history of upper respiratory symptoms.  Home COVID test negative.  Her partner has had similar symptoms.  She has had some congestion, sore throat, mild cough.  Mild bodyaches.  Denies any nausea, vomiting, or diarrhea.  No reported fever.  No dyspnea.  She feels somewhat better today.  She was concerned because of her past history of adenocarcinoma of the lung.  She does have follow-up scheduled CT scan and labs and follow-up with oncologist in the next couple of weeks.  Past Medical History:  Diagnosis Date   Alcohol abuse    - abstemious 20 years. Attends AA - strong recovery    Allergic reaction    Allergy    seasonal   Anxiety    COPD (chronic obstructive pulmonary disease) (HCC)    minor   Family history of adverse reaction to anesthesia    mother had problems with n/v   Fibromyalgia    slight   GERD (gastroesophageal reflux disease)    HSV-1 infection    Hyperlipemia    Hypertension    IBS (irritable bowel syndrome)    NSCL Ca dx'd 10/24/20   Postnasal drip    RBBB (right bundle branch block)    Rotator cuff syndrome    right   Past Surgical History:  Procedure Laterality Date   COLONOSCOPY     INTERCOSTAL NERVE BLOCK Right 10/25/2019   Procedure: INTERCOSTAL NERVE BLOCK;  Surgeon: [name], MD;  Location: [facility] OR;  Service: Thoracic;  Laterality: Right;   MOUTH SURGERY  2002   NODE DISSECTION Right 10/25/2019   Procedure: NODE DISSECTION;  Surgeon: [name], MD;  Location: [facility] OR;  Service: Thoracic;  Laterality: Right;   ROTATOR CUFF REPAIR  10-24-05   had bone spur and laceration off cuff. Daldorf   XI ROBOTIC ASSISTED THORACOSCOPY- SEGMENTECTOMY Right 10/25/2019   Procedure: XI ROBOTIC ASSISTED  THORACOSCOPY-SUPERIOR SEGMENTECTOMY;  Surgeon: [name], MD;  Location: [facility] OR;  Service: Thoracic;  Laterality: Right;    reports that she quit smoking about 27 years ago. Her smoking use included cigarettes. She started smoking about 52 years ago. She has a 25 pack-year smoking history. She has been exposed to tobacco smoke. She has never used smokeless tobacco. She reports that she does not drink alcohol and does not use drugs. family history includes Aneurysm in her mother and another family member; Brain cancer in her brother; Cancer in her brother; Cancer (age of onset: 41) in her brother; Coronary artery disease in her mother; Diabetes in her brother; Heart attack in her mother; Hyperlipidemia in her mother; Irritable bowel syndrome in her mother; Kidney cancer in her brother; Lung cancer in her brother and another family member; Stroke in her father and another family member. Allergies  Allergen Reactions   Codeine     Makes heart race   Penicillins     unknown   Sulfonamide Derivatives     Has a rash with the use of silvadiene for burn   Doxycycline      Stomach problems   Aspirin     Upset stomach, High dose only    Review of Systems  Constitutional:  Negative for chills  and fever.  HENT:  Positive for congestion and sore throat.   Respiratory:  Positive for cough. Negative for hemoptysis, shortness of breath and wheezing.   Cardiovascular:  Negative for chest pain.      Objective:     BP 134/60   Pulse 74   Temp 97.9 F (36.6 C) (Oral)   Wt 132 lb 3.2 oz (60 kg)   SpO2 98%   BMI 22.00 kg/m  BP Readings from Last 3 Encounters:  11/28/23 134/60  08/25/23 120/60  08/25/23 120/60   Wt Readings from Last 3 Encounters:  11/28/23 132 lb 3.2 oz (60 kg)  08/25/23 131 lb (59.4 kg)  08/25/23 131 lb (59.4 kg)      Physical Exam Vitals reviewed.  Constitutional:      General: She is not in acute distress.    Appearance: She is not ill-appearing.  HENT:      Right Ear: Tympanic membrane normal.     Left Ear: Tympanic membrane normal.     Mouth/Throat:     Mouth: Mucous membranes are moist.     Pharynx: Oropharynx is clear. No oropharyngeal exudate or posterior oropharyngeal erythema.  Cardiovascular:     Rate and Rhythm: Normal rate and regular rhythm.  Pulmonary:     Effort: Pulmonary effort is normal. No respiratory distress.     Breath sounds: Normal breath sounds. No wheezing or rales.  Neurological:     Mental Status: She is alert.      No results found for any visits on 11/28/23.    The 10-year ASCVD risk score (Arnett DK, et al., 2019) is: 24.6%    Assessment & Plan:   Probable viral URI with cough.  Nonfocal exam.  She does not have any fever.  No rales or wheezes on exam.  Pulse oximetry 98% room air.  Home COVID test negative.  -Recommend plenty of fluids and rest - Follow-up immediately for any fever or increased shortness of breath - Consider over-the-counter Mucinex 1200 mg twice daily  [name], MD

## 2023-11-28 NOTE — Patient Instructions (Signed)
 Follow up for any fever or increased shortness of breath  Stay well hydrated and consider OTC Mucinex 1,200 mg  twice daily.

## 2023-12-03 ENCOUNTER — Encounter: Payer: Self-pay | Admitting: Internal Medicine

## 2023-12-03 ENCOUNTER — Encounter: Payer: Self-pay | Admitting: Family Medicine

## 2023-12-03 MED ORDER — CEFDINIR 300 MG PO CAPS: 300.0000 mg | ORAL_CAPSULE | Freq: Two times a day (BID) | ORAL | 0 refills | Status: AC

## 2023-12-08 ENCOUNTER — Other Ambulatory Visit

## 2023-12-08 ENCOUNTER — Ambulatory Visit (HOSPITAL_COMMUNITY)
Admission: RE | Admit: 2023-12-08 | Discharge: 2023-12-08 | Disposition: A | Discharge status: Home / Self Care | Source: Ambulatory Visit | Attending: Internal Medicine | Admitting: Internal Medicine

## 2023-12-08 ENCOUNTER — Inpatient Hospital Stay: Payer: Medicare HMO | Attending: Internal Medicine

## 2023-12-08 DIAGNOSIS — I700 Atherosclerosis of aorta: Secondary | ICD-10-CM | POA: Diagnosis not present

## 2023-12-08 DIAGNOSIS — Z08 Encounter for follow-up examination after completed treatment for malignant neoplasm: Secondary | ICD-10-CM | POA: Insufficient documentation

## 2023-12-08 DIAGNOSIS — C3490 Malignant neoplasm of unspecified part of unspecified bronchus or lung: Secondary | ICD-10-CM | POA: Insufficient documentation

## 2023-12-08 DIAGNOSIS — J432 Centrilobular emphysema: Secondary | ICD-10-CM | POA: Diagnosis not present

## 2023-12-08 DIAGNOSIS — Z85118 Personal history of other malignant neoplasm of bronchus and lung: Secondary | ICD-10-CM | POA: Insufficient documentation

## 2023-12-08 DIAGNOSIS — C3431 Malignant neoplasm of lower lobe, right bronchus or lung: Secondary | ICD-10-CM | POA: Diagnosis not present

## 2023-12-08 LAB — CBC WITH DIFFERENTIAL (CANCER CENTER ONLY)
Abs Immature Granulocytes: 0.02 K/uL (ref 0.00–0.07)
Basophils Absolute: 0 K/uL (ref 0.0–0.1)
Basophils Relative: 1 %
Eosinophils Absolute: 0.2 K/uL (ref 0.0–0.5)
Eosinophils Relative: 3 %
HCT: 37.1 % (ref 36.0–46.0)
Hemoglobin: 12.2 g/dL (ref 12.0–15.0)
Immature Granulocytes: 0 %
Lymphocytes Relative: 29 %
Lymphs Abs: 1.7 K/uL (ref 0.7–4.0)
MCH: 31.2 pg (ref 26.0–34.0)
MCHC: 32.9 g/dL (ref 30.0–36.0)
MCV: 94.9 fL (ref 80.0–100.0)
Monocytes Absolute: 0.6 K/uL (ref 0.1–1.0)
Monocytes Relative: 9 %
Neutro Abs: 3.4 K/uL (ref 1.7–7.7)
Neutrophils Relative %: 58 %
Platelet Count: 238 K/uL (ref 150–400)
RBC: 3.91 MIL/uL (ref 3.87–5.11)
RDW: 12.9 % (ref 11.5–15.5)
WBC Count: 5.9 K/uL (ref 4.0–10.5)
nRBC: 0 % (ref 0.0–0.2)

## 2023-12-08 LAB — CMP (CANCER CENTER ONLY)
ALT: 19 U/L (ref 0–44)
AST: 26 U/L (ref 15–41)
Albumin: 4.2 g/dL (ref 3.5–5.0)
Alkaline Phosphatase: 63 U/L (ref 38–126)
Anion gap: 5 (ref 5–15)
BUN: 14 mg/dL (ref 8–23)
CO2: 31 mmol/L (ref 22–32)
Calcium: 9.6 mg/dL (ref 8.9–10.3)
Chloride: 102 mmol/L (ref 98–111)
Creatinine: 1 mg/dL (ref 0.44–1.00)
GFR, Estimated: 58 mL/min — ABNORMAL LOW (ref >60)
Glucose, Bld: 96 mg/dL (ref 70–99)
Potassium: 4.3 mmol/L (ref 3.5–5.1)
Sodium: 138 mmol/L (ref 135–145)
Total Bilirubin: 0.3 mg/dL (ref 0.0–1.2)
Total Protein: 7.1 g/dL (ref 6.5–8.1)

## 2023-12-08 MED ORDER — SODIUM CHLORIDE (PF) 0.9 % IJ SOLN
INTRAMUSCULAR | Status: AC
  Filled 2023-12-08: qty 50

## 2023-12-08 MED ORDER — IOHEXOL 300 MG/ML  SOLN
75.0000 mL | Freq: Once | INTRAMUSCULAR | Status: AC | PRN
  Administered 2023-12-08: 75 mL via INTRAVENOUS

## 2023-12-10 ENCOUNTER — Other Ambulatory Visit: Payer: Self-pay | Admitting: Family Medicine

## 2023-12-12 ENCOUNTER — Ambulatory Visit

## 2023-12-15 ENCOUNTER — Inpatient Hospital Stay: Payer: Medicare HMO | Attending: Internal Medicine | Admitting: Internal Medicine

## 2023-12-15 VITALS — BP 128/61 | HR 100 | Temp 97.6°F | Resp 17 | Ht 65.0 in | Wt 131.0 lb

## 2023-12-15 DIAGNOSIS — Z85118 Personal history of other malignant neoplasm of bronchus and lung: Secondary | ICD-10-CM | POA: Insufficient documentation

## 2023-12-15 DIAGNOSIS — R918 Other nonspecific abnormal finding of lung field: Secondary | ICD-10-CM | POA: Diagnosis not present

## 2023-12-15 DIAGNOSIS — C3490 Malignant neoplasm of unspecified part of unspecified bronchus or lung: Secondary | ICD-10-CM

## 2023-12-15 DIAGNOSIS — Z08 Encounter for follow-up examination after completed treatment for malignant neoplasm: Secondary | ICD-10-CM | POA: Insufficient documentation

## 2023-12-15 NOTE — Progress Notes (Signed)
 [facility] [phone]   [phone]  OFFICE PROGRESS NOTE  [name] ORN, MD [address] [place] [address]  DIAGNOSIS: Stage IA (T1b, N0, M0) non-small cell lung cancer, adenocarcinoma.  She presented with a right lower lobe pulmonary nodule.  She was diagnosed in September 2021.  PRIOR THERAPY: Status post robotic assisted right thoracoscopy with right lower lobe superior segmentectomy and lymph node dissection under the care of Dr. Kerrin on 10/25/2019.  CURRENT THERAPY: Observation.  INTERVAL HISTORY: Teresa Booker 78 y.o. female returns to the clinic today for follow-up visit accompanied by her female partner on the phone during the visit. Discussed the use of AI scribe software for clinical note transcription with the patient, who gave verbal consent to proceed.  History of Present Illness Teresa Booker is a 78 year old female with stage one non-small cell lung cancer who presents for evaluation with repeat CT scan for restaging of her disease.  She has a history of stage one non-small cell lung cancer, adenocarcinoma, diagnosed in September 2021. She underwent a right lower lobe superior segmentectomy with lymph node dissection and has been on observation since then. She is here for a repeat CT scan of the chest to restage her disease.  She had a cold in the past year, which has resolved. No chest pain, breathing issues, hemoptysis, nausea, vomiting, diarrhea, changes in vision, headaches, or recent weight loss.  The recent CT scan identified a 3 mm right upper lobe nodule, a 3 mm right middle lobe nodule, and a ground glass opacity in the left lower lobe.  She inquires about her last colonoscopy, which was three years ago following her surgery. She had precancerous polyps removed in the past and mentions having hemorrhoids that occasionally cause discomfort.     MEDICAL HISTORY: Past Medical History:  Diagnosis Date    Alcohol abuse    - abstemious 20 years. Attends AA - strong recovery    Allergic reaction    Allergy    seasonal   Anxiety    COPD (chronic obstructive pulmonary disease) (HCC)    minor   Family history of adverse reaction to anesthesia    mother had problems with n/v   Fibromyalgia    slight   GERD (gastroesophageal reflux disease)    HSV-1 infection    Hyperlipemia    Hypertension    IBS (irritable bowel syndrome)    NSCL Ca dx'd 10/24/20   Postnasal drip    RBBB (right bundle branch block)    Rotator cuff syndrome    right    ALLERGIES:  is allergic to codeine, penicillins, sulfonamide derivatives, doxycycline , and aspirin.  MEDICATIONS:  Current Outpatient Medications  Medication Sig Dispense Refill   aspirin EC 81 MG tablet Take 81 mg by mouth daily. Swallow whole.     Calcium  200 MG TABS Take 200 mg by mouth every evening.      cephALEXin  (KEFLEX ) 500 MG capsule Take 1 capsule (500 mg total) by mouth 4 (four) times daily. 28 capsule 0   diphenhydramine-acetaminophen  (TYLENOL  PM EXTRA STRENGTH) 25-500 MG TABS tablet Take 1 tablet by mouth at bedtime as needed.     ECHINACEA COMPLEX PO Take 1 tablet by mouth daily.     hydrOXYzine  (ATARAX ) 25 MG tablet Take 1 tablet (25 mg total) by mouth every 8 (eight) hours as needed. 90 tablet 2   hyoscyamine  (LEVBID ) 0.375 MG 12 hr tablet TAKE  1 TABLET BY MOUTH TWICE A DAY 180 tablet 0   L-Lysine  500 MG TABS Take 500 mg by mouth daily.     loratadine  (CLARITIN ) 10 MG tablet Take 1 tablet (10 mg total) by mouth daily. 90 tablet 3   Magnesium  250 MG TABS Take 250 mg by mouth every evening.      methocarbamol  (ROBAXIN ) 500 MG tablet Take 1 tablet (500 mg total) by mouth every 6 (six) hours as needed for muscle spasms. 60 tablet 0   metoprolol  succinate (TOPROL -XL) 50 MG 24 hr tablet TAKE 1 TABLET BY MOUTH ONCE DAILY WITH A MEAL OR  IMMEDIATELY  FOLLOWING  A  MEAL 90 tablet 3   Multiple Vitamin (MULTIVITAMIN WITH MINERALS) TABS tablet  Take 1 tablet by mouth daily.     omeprazole (PRILOSEC) 20 MG capsule Take 20 mg by mouth daily before breakfast.     POTASSIUM PO Take 1 tablet by mouth daily.     rosuvastatin  (CRESTOR ) 10 MG tablet TAKE 1 TABLET BY MOUTH AT BEDTIME 30 tablet 11   valACYclovir  (VALTREX ) 1000 MG tablet TAKE 2 TABLETS BY MOUTH AT ONSET OF COLD SORES AND REPEAT TWO IN 12 HOURS 60 tablet 0   acyclovir  (ZOVIRAX ) 400 MG tablet Take 1 tablet daily as needed for cold sores 30 tablet 0   No current facility-administered medications for this visit.    SURGICAL HISTORY:  Past Surgical History:  Procedure Laterality Date   COLONOSCOPY     INTERCOSTAL NERVE BLOCK Right 10/25/2019   Procedure: INTERCOSTAL NERVE BLOCK;  Surgeon: [name], MD;  Location: [facility] OR;  Service: Thoracic;  Laterality: Right;   MOUTH SURGERY  2002   NODE DISSECTION Right 10/25/2019   Procedure: NODE DISSECTION;  Surgeon: [name], MD;  Location: [facility] OR;  Service: Thoracic;  Laterality: Right;   ROTATOR CUFF REPAIR  10-24-05   had bone spur and laceration off cuff. Daldorf   XI ROBOTIC ASSISTED THORACOSCOPY- SEGMENTECTOMY Right 10/25/2019   Procedure: XI ROBOTIC ASSISTED THORACOSCOPY-SUPERIOR SEGMENTECTOMY;  Surgeon: [name], MD;  Location: [facility] OR;  Service: Thoracic;  Laterality: Right;    REVIEW OF SYSTEMS:  A comprehensive review of systems was negative.   PHYSICAL EXAMINATION: General appearance: alert, cooperative, and no distress Head: Normocephalic, without obvious abnormality, atraumatic Neck: no adenopathy, no JVD, supple, symmetrical, trachea midline, and thyroid  not enlarged, symmetric, no tenderness/mass/nodules Lymph nodes: Cervical, supraclavicular, and axillary nodes normal. Resp: clear to auscultation bilaterally Back: symmetric, no curvature. ROM normal. No CVA tenderness. Cardio: regular rate and rhythm, S1, S2 normal, no murmur, click, rub or gallop GI: soft, non-tender; bowel sounds  normal; no masses,  no organomegaly Extremities: extremities normal, atraumatic, no cyanosis or edema  ECOG PERFORMANCE STATUS: 1 - Symptomatic but completely ambulatory  Blood pressure 128/61, pulse 100, temperature 97.6 F (36.4 C), temperature source Temporal, resp. rate 17, height 5' 5 (1.651 m), weight 131 lb (59.4 kg), SpO2 98%.  LABORATORY DATA: Lab Results  Component Value Date   WBC 5.9 12/08/2023   HGB 12.2 12/08/2023   HCT 37.1 12/08/2023   MCV 94.9 12/08/2023   PLT 238 12/08/2023      Chemistry      Component Value Date/Time   NA 138 12/08/2023 1005   NA 143 08/25/2018 1043   K 4.3 12/08/2023 1005   CL 102 12/08/2023 1005   CO2 31 12/08/2023 1005   BUN 14 12/08/2023 1005   BUN 17 08/25/2018 1043  CREATININE 1.00 12/08/2023 1005      Component Value Date/Time   CALCIUM  9.6 12/08/2023 1005   ALKPHOS 63 12/08/2023 1005   AST 26 12/08/2023 1005   ALT 19 12/08/2023 1005   BILITOT 0.3 12/08/2023 1005       RADIOGRAPHIC STUDIES: CT Chest W Contrast Result Date: 12/09/2023 CLINICAL DATA:  Right lower lobe adenocarcinoma status post segmentectomy 10/25/2019. Surveillance. * Tracking Code: BO * EXAM: CT CHEST WITH CONTRAST TECHNIQUE: Multidetector CT imaging of the chest was performed during intravenous contrast administration. RADIATION DOSE REDUCTION: This exam was performed according to the departmental dose-optimization program which includes automated exposure control, adjustment of the mA and/or kV according to patient size and/or use of iterative reconstruction technique. CONTRAST:  75mL OMNIPAQUE  IOHEXOL  300 MG/ML  SOLN COMPARISON:  CT chest dated 12/11/2022 FINDINGS: Cardiovascular: Left atrial enlargement. No significant pericardial fluid/thickening. Great vessels are normal in course and caliber. No central pulmonary emboli. Coronary artery calcifications and aortic atherosclerosis. Mediastinum/Nodes: Imaged thyroid  gland without nodules meeting criteria for  imaging follow-up by size. Normal esophagus. No pathologically enlarged axillary, supraclavicular, mediastinal, or hilar lymph nodes. Lungs/Pleura: The central airways are patent. Mild centrilobular emphysema. Biapical pleural-parenchymal scarring. Surgical changes of the right lower lobe. Scattered subsegmental mucous plugging. Scattered solid and ground-glass nodules are unchanged: -solid 3 mm right upper lobe (8: 38) -ground-glass 3 mm right middle lobe (8:83) -ground-glass peripheral left lower lobe measuring up to 5 mm (8:113, 124) -ground-glass subpleural right lower lobe 5 mm (8:118) Solid basilar left lower lobe 6 mm (8:132) no pneumothorax. No pleural effusion. Upper abdomen: Normal. Musculoskeletal: No acute or abnormal lytic or blastic osseous lesions. IMPRESSION: 1. Surgical changes of the right lower lobe. No evidence of local recurrence or metastatic disease in the chest. 2. Unchanged scattered solid and ground-glass nodules. 3. Aortic Atherosclerosis (ICD10-I70.0) and Emphysema (ICD10-J43.9). Coronary artery calcifications. Assessment for potential risk factor modification, dietary therapy or pharmacologic therapy may be warranted, if clinically indicated. Electronically Signed   By: Limin  Xu M.D.   On: 12/09/2023 15:51     ASSESSMENT AND PLAN: This is a very pleasant 78 years old white female recently diagnosed with a stage IA (T1b, N0, M0) non-small cell lung cancer, adenocarcinoma diagnosed in September 2021 status post right lower lobe superior segmentectomy with lymph node dissection under the care of Dr. Kerrin. The patient is currently on observation and she is feeling fine with no concerning complaints. She had repeat CT scan of the chest performed recently.  I personally independently reviewed the scan and discussed the results with the patient and her partner is available by phone during the visit. Her scan showed no concerning findings for disease recurrence or  metastasis. Assessment and Plan Assessment & Plan History of stage I non-small cell lung cancer, status post resection and surveillance Stage I non-small cell lung cancer, status post right lower lobe superior segmentectomy with lymph node dissection. Currently under surveillance with no evidence of disease progression. Recent CT scan shows no growth in pulmonary nodules or ground glass opacity. No new symptoms such as chest pain, dyspnea, hemoptysis, nausea, vomiting, diarrhea, vision changes, or headaches. No recent weight loss. - Continue surveillance with annual CT scan of the chest.  Pulmonary nodules under surveillance Pulmonary nodules include a 3 mm right upper lobe nodule, a 3 mm right middle lobe nodule, and a ground glass opacity in the left lower lobe. All nodules are stable with no growth or symptoms. - Continue surveillance of pulmonary  nodules with annual CT scan. She was advised to call immediately if she has any concerning symptoms in the interval.  The patient voices understanding of current disease status and treatment options and is in agreement with the current care plan.  All questions were answered. The patient knows to call the clinic with any problems, questions or concerns. We can certainly see the patient much sooner if necessary.   Disclaimer: This note was dictated with voice recognition software. Similar sounding words can inadvertently be transcribed and may not be corrected upon review.

## 2023-12-19 ENCOUNTER — Other Ambulatory Visit: Payer: Self-pay

## 2023-12-19 MED ORDER — ROSUVASTATIN CALCIUM 10 MG PO TABS: 10.0000 mg | ORAL_TABLET | Freq: Every day | ORAL | 2 refills | Status: AC

## 2023-12-26 ENCOUNTER — Ambulatory Visit

## 2024-01-14 ENCOUNTER — Other Ambulatory Visit: Payer: Self-pay | Admitting: Cardiovascular Disease

## 2024-02-02 ENCOUNTER — Other Ambulatory Visit (HOSPITAL_COMMUNITY): Payer: Self-pay

## 2024-02-02 ENCOUNTER — Other Ambulatory Visit: Payer: Self-pay | Admitting: Family Medicine

## 2024-02-02 ENCOUNTER — Telehealth: Payer: Self-pay

## 2024-02-02 NOTE — Telephone Encounter (Signed)
 Pharmacy Patient Advocate Encounter   Received notification from Onbase that prior authorization for hydrOXYzine  (ATARAX ) 25 MG tablet  is required/requested.   Insurance verification completed.   The patient is insured through [facility] MEDICARE.   Per test claim: PA required; PA submitted to above mentioned insurance via Latent Key/confirmation #/EOC [id number] Status is pending

## 2024-02-03 ENCOUNTER — Other Ambulatory Visit (HOSPITAL_COMMUNITY): Payer: Self-pay

## 2024-02-03 NOTE — Telephone Encounter (Signed)
 Pharmacy Patient Advocate Encounter  Received notification from [facility] that Prior Authorization for hydrOXYzine  HCl 25MG  tablets  has been APPROVED from 02/03/2024 to 02/11/2024    PLEASE BE ADVISED THE PA IS ONLY GOOD UNTIL 02/11/2024   PA #/Case ID/Reference #: [id number]

## 2024-02-09 ENCOUNTER — Other Ambulatory Visit: Payer: Self-pay | Admitting: Family Medicine

## 2024-02-09 NOTE — Telephone Encounter (Signed)
 Noted prior authorization was completed through [employer] [employer] per insurance and no authorization request has been received for Hyoscyamine  at this time

## 2024-02-23 ENCOUNTER — Ambulatory Visit: Admitting: Family Medicine

## 2024-02-23 ENCOUNTER — Ambulatory Visit: Payer: Self-pay

## 2024-02-23 ENCOUNTER — Encounter: Payer: Self-pay | Admitting: Family Medicine

## 2024-02-23 VITALS — BP 124/72 | HR 79 | Temp 98.0°F | Ht 65.0 in | Wt 131.2 lb

## 2024-02-23 DIAGNOSIS — R296 Repeated falls: Secondary | ICD-10-CM

## 2024-02-23 DIAGNOSIS — S0003XA Contusion of scalp, initial encounter: Secondary | ICD-10-CM

## 2024-02-23 DIAGNOSIS — G629 Polyneuropathy, unspecified: Secondary | ICD-10-CM

## 2024-02-23 DIAGNOSIS — R252 Cramp and spasm: Secondary | ICD-10-CM | POA: Diagnosis not present

## 2024-02-23 NOTE — Telephone Encounter (Signed)
 FYI Only or Action Required?: FYI only for provider: appointment scheduled on 02/23/24.  Patient was last seen in primary care on 11/28/2023 by [name] ORN, MD.  Called Nurse Triage reporting Head Injury.  Symptoms began today.  Interventions attempted: Nothing.  Symptoms are: stable.  Triage Disposition: See HCP Within 4 Hours (Or PCP Triage)  Patient/caregiver understands and will follow disposition?: Yes Reason for Disposition  [1] Age over 64 years AND [2] swelling or bruise  Answer Assessment - Initial Assessment Questions ED precautions reviewed including change of mental status, vision changes, nausea, feeling faint, numbness, weakness, pt and spouse verbalized understanding.   1. MECHANISM: How did the injury happen? For falls, ask: What height did you fall from? and What surface did you fall against?      Fell from standing position after foot cramped, striking her head on her nightstand 2. ONSET: When did the injury happen? (e.g., minutes, hours ago)      0610 today, 02/23/24.  3. NEUROLOGIC SYMPTOMS: Was there any loss of consciousness? Are there any other neurological symptoms?      Denies LOC 4. MENTAL STATUS: Does the person know who they are, who you are, and where they are?      CAOx3 5. LOCATION: What part of the head was hit?      Back of head 6. SCALP APPEARANCE: What does the scalp look like? Is it bleeding now? If Yes, ask: Is it difficult to stop?      Bump 8. PAIN: Is there any pain? If Yes, ask: How bad is it? (Scale 0-10; or none, mild, moderate, severe)     4/10, only with palpation 10. BLOOD THINNERS: Do you take any blood thinners? (e.g., aspirin, clopidogrel / Plavix, coumadin, heparin ). Notes: Other strong blood thinners include: Arixtra (fondaparinux), Eliquis (apixaban), Pradaxa (dabigatran), and Xarelto (rivaroxaban).       Aspirin only 11. OTHER SYMPTOMS: Do you have any other symptoms? (e.g., neck pain,  vomiting)       Denies  Protocols used: Head Injury-A-AH Copied from CRM #[id number]. Topic: Clinical - Red Word Triage >> Feb 23, 2024  8:53 AM Suzen RAMAN wrote: Red Word that prompted transfer to Nurse Triage: fell and hit head on night stand has a knot on back of head, no bleeding, doesn't take blood thinner but on Asprin per spouse Past Medical History:  Diagnosis Date   Alcohol abuse    - abstemious 20 years. Attends AA - strong recovery    Allergic reaction    Allergy    seasonal   Anxiety    COPD (chronic obstructive pulmonary disease) (HCC)    minor   Family history of adverse reaction to anesthesia    mother had problems with n/v   Fibromyalgia    slight   GERD (gastroesophageal reflux disease)    HSV-1 infection    Hyperlipemia    Hypertension    IBS (irritable bowel syndrome)    NSCL Ca dx'd 10/24/20   Postnasal drip    RBBB (right bundle branch block)    Rotator cuff syndrome    right

## 2024-02-23 NOTE — Progress Notes (Unsigned)
 "  Acute Office Visit  Subjective:     Patient ID: Teresa Booker, female    DOB: 03-Nov-1945, 79 y.o.   MRN: [id number]  Chief Complaint  Patient presents with   Acute Visit    HPI  Discussed the use of AI scribe software for clinical note transcription with the patient, who gave verbal consent to proceed.  History of Present Illness Teresa Booker is a 79 year old female who presents with a head injury following a fall due to leg cramps. She is accompanied by her daughter.  Head injury - Sustained head trauma after falling backwards and striking the occiput on a nightstand - Tender lump present on the posterior scalp - Applying ice to the area - Mild headache occurred upon standing after the fall, resolved without medication - No loss of consciousness - No dizziness, nausea, or vomiting  Lower extremity cramps - Frequent leg cramps, including a severe cramp in the foot this morning - No cramps while walking or swimming - Uses magnesium  (approximately 400 mg daily) and eats a banana daily for cramp prevention - Uses turmeric ginger mixture, which has reduced cramp frequency and severity  Ankle instability and falls - History of multiple falls over the past year, often when previously dislocated ankle gives way - Weak ankle contributed to today's fall - Cautious on potentially slippery surfaces  Peripheral neuropathy - Neuropathy noted at recent Medicare visit per daughter     ROS Per HPI      Objective:    BP (!) 148/72 (BP Location: Left Arm, Patient Position: Sitting)   Pulse 79   Temp 98 F (36.7 C) (Temporal)   Ht 5' 5 (1.651 m)   Wt 131 lb 3.2 oz (59.5 kg)   SpO2 94%   BMI 21.83 kg/m    Physical Exam Vitals and nursing note reviewed.  Constitutional:      General: She is not in acute distress.    Appearance: Normal appearance. She is normal weight.  HENT:     Head: Normocephalic and atraumatic.     Right Ear: External ear normal.     Left Ear:  External ear normal.     Nose: Nose normal.     Mouth/Throat:     Mouth: Mucous membranes are moist.     Pharynx: Oropharynx is clear.  Eyes:     Extraocular Movements: Extraocular movements intact.     Pupils: Pupils are equal, round, and reactive to light.  Cardiovascular:     Rate and Rhythm: Normal rate and regular rhythm.     Pulses: Normal pulses.     Heart sounds: Normal heart sounds.  Pulmonary:     Effort: Pulmonary effort is normal. No respiratory distress.     Breath sounds: Normal breath sounds. No wheezing, rhonchi or rales.  Musculoskeletal:        General: Normal range of motion.     Cervical back: Normal range of motion.     Right lower leg: No edema.     Left lower leg: No edema.  Lymphadenopathy:     Cervical: No cervical adenopathy.  Neurological:     General: No focal deficit present.     Mental Status: She is alert and oriented to person, place, and time.  Psychiatric:        Mood and Affect: Mood normal.        Thought Content: Thought content normal.     No results found for any visits  on 02/23/24.      Assessment & Plan:   Assessment and Plan Assessment & Plan Scalp contusion after fall Scalp contusion with no neurological deficits or signs of intracranial bleeding. Mild headache resolved. - Apply ice for swelling. - Hold aspirin for three days. - Use Tylenol  for pain if needed. - Seek emergency care for bleeding, dizziness, persistent headache, vomiting, or unexplained falls.  Recurrent falls Recurrent falls possibly due to polyneuropathy and history of ankle dislocation. Vascular issues considered as potential cause. - Referred to vascular specialist for evaluation of vascular issues related to falls and leg cramps.  Polyneuropathy Polyneuropathy may contribute to falls and leg instability. Previous cardiology evaluation was unremarkable, but re-evaluation needed due to symptom progression. - Referred to vascular specialist for further  evaluation of polyneuropathy's impact on falls.  Leg cramps Frequent leg cramps with some improvement from magnesium , potassium, and turmeric ginger. Vascular spasm considered as potential cause. - Continue magnesium  supplementation. - Consider evaluation for vascular spasm if cramps persist or worsen.     No orders of the defined types were placed in this encounter.    No orders of the defined types were placed in this encounter.   No follow-ups on file.  Corean LITTIE Ku, FNP  "

## 2024-02-24 NOTE — Patient Instructions (Addendum)
 I think that you just have the lump to your head, your physical exam is reassuring today.   I do not think you have a concussion either.   Referral to vascular for PAD workup today. Someone should be reaching out to get you scheduled.   Follow-up with me for new or worsening symptoms.  Go to the ER for further evaluation if there is any nausea, vomiting, persistent headache, dizziness, new numbness or tingling.

## 2024-02-25 ENCOUNTER — Encounter: Payer: Self-pay | Admitting: Family Medicine

## 2024-03-04 ENCOUNTER — Other Ambulatory Visit: Payer: Self-pay | Admitting: Vascular Surgery

## 2024-03-04 DIAGNOSIS — M79604 Pain in right leg: Secondary | ICD-10-CM

## 2024-03-26 ENCOUNTER — Ambulatory Visit (HOSPITAL_COMMUNITY): Admitting: Vascular Surgery

## 2024-03-26 ENCOUNTER — Ambulatory Visit (HOSPITAL_COMMUNITY)

## 2024-08-30 ENCOUNTER — Ambulatory Visit

## 2024-12-07 ENCOUNTER — Inpatient Hospital Stay

## 2024-12-14 ENCOUNTER — Inpatient Hospital Stay: Admitting: Internal Medicine
